# Patient Record
Sex: Female | Born: 1955 | Race: White | Hispanic: No | Marital: Single | Smoking: Former smoker
Health system: Southern US, Community
[De-identification: ages and names within clinical notes are randomized; demographics above are authoritative.]

## PROBLEM LIST (undated history)

## (undated) DIAGNOSIS — E669 Obesity, unspecified: Secondary | ICD-10-CM

## (undated) DIAGNOSIS — Z9889 Other specified postprocedural states: Secondary | ICD-10-CM

## (undated) DIAGNOSIS — R3915 Urgency of urination: Secondary | ICD-10-CM

## (undated) DIAGNOSIS — M549 Dorsalgia, unspecified: Secondary | ICD-10-CM

## (undated) DIAGNOSIS — J189 Pneumonia, unspecified organism: Secondary | ICD-10-CM

## (undated) DIAGNOSIS — R112 Nausea with vomiting, unspecified: Secondary | ICD-10-CM

## (undated) DIAGNOSIS — T7840XA Allergy, unspecified, initial encounter: Secondary | ICD-10-CM

## (undated) DIAGNOSIS — J302 Other seasonal allergic rhinitis: Secondary | ICD-10-CM

## (undated) DIAGNOSIS — G4700 Insomnia, unspecified: Secondary | ICD-10-CM

## (undated) DIAGNOSIS — Z8619 Personal history of other infectious and parasitic diseases: Secondary | ICD-10-CM

## (undated) DIAGNOSIS — K219 Gastro-esophageal reflux disease without esophagitis: Secondary | ICD-10-CM

## (undated) DIAGNOSIS — M797 Fibromyalgia: Secondary | ICD-10-CM

## (undated) DIAGNOSIS — F419 Anxiety disorder, unspecified: Secondary | ICD-10-CM

## (undated) DIAGNOSIS — R945 Abnormal results of liver function studies: Secondary | ICD-10-CM

## (undated) DIAGNOSIS — Z8709 Personal history of other diseases of the respiratory system: Secondary | ICD-10-CM

## (undated) DIAGNOSIS — G43909 Migraine, unspecified, not intractable, without status migrainosus: Secondary | ICD-10-CM

## (undated) DIAGNOSIS — E785 Hyperlipidemia, unspecified: Secondary | ICD-10-CM

## (undated) DIAGNOSIS — M2550 Pain in unspecified joint: Secondary | ICD-10-CM

## (undated) DIAGNOSIS — M62830 Muscle spasm of back: Secondary | ICD-10-CM

## (undated) DIAGNOSIS — M069 Rheumatoid arthritis, unspecified: Secondary | ICD-10-CM

## (undated) DIAGNOSIS — R7989 Other specified abnormal findings of blood chemistry: Secondary | ICD-10-CM

## (undated) DIAGNOSIS — I10 Essential (primary) hypertension: Secondary | ICD-10-CM

## (undated) DIAGNOSIS — G8929 Other chronic pain: Secondary | ICD-10-CM

## (undated) DIAGNOSIS — M2540 Effusion, unspecified joint: Secondary | ICD-10-CM

## (undated) DIAGNOSIS — M1990 Unspecified osteoarthritis, unspecified site: Secondary | ICD-10-CM

## (undated) HISTORY — DX: Fibromyalgia: M79.7

## (undated) HISTORY — DX: Allergy, unspecified, initial encounter: T78.40XA

## (undated) HISTORY — DX: Other specified abnormal findings of blood chemistry: R79.89

## (undated) HISTORY — DX: Obesity, unspecified: E66.9

## (undated) HISTORY — PX: COLONOSCOPY: SHX174

## (undated) HISTORY — DX: Abnormal results of liver function studies: R94.5

## (undated) HISTORY — PX: BACK SURGERY: SHX140

## (undated) HISTORY — PX: CARPAL TUNNEL RELEASE: SHX101

## (undated) HISTORY — PX: NECK SURGERY: SHX720

## (undated) HISTORY — DX: Hyperlipidemia, unspecified: E78.5

---

## 1997-10-29 ENCOUNTER — Ambulatory Visit (HOSPITAL_BASED_OUTPATIENT_CLINIC_OR_DEPARTMENT_OTHER): Admission: RE | Admit: 1997-10-29 | Discharge: 1997-10-29 | Payer: Self-pay

## 2001-05-03 HISTORY — PX: ABDOMINAL HYSTERECTOMY: SHX81

## 2003-02-11 ENCOUNTER — Inpatient Hospital Stay (HOSPITAL_COMMUNITY): Admission: RE | Admit: 2003-02-11 | Discharge: 2003-02-13 | Payer: Self-pay | Admitting: Obstetrics and Gynecology

## 2003-02-11 ENCOUNTER — Encounter (INDEPENDENT_AMBULATORY_CARE_PROVIDER_SITE_OTHER): Payer: Self-pay | Admitting: Specialist

## 2004-08-08 ENCOUNTER — Emergency Department (HOSPITAL_COMMUNITY): Admission: EM | Admit: 2004-08-08 | Discharge: 2004-08-08 | Payer: Self-pay | Admitting: Emergency Medicine

## 2007-04-05 ENCOUNTER — Ambulatory Visit: Payer: Self-pay | Admitting: Obstetrics & Gynecology

## 2007-04-05 ENCOUNTER — Ambulatory Visit (HOSPITAL_COMMUNITY): Admission: RE | Admit: 2007-04-05 | Discharge: 2007-04-05 | Payer: Self-pay | Admitting: Family Medicine

## 2007-04-05 ENCOUNTER — Encounter (INDEPENDENT_AMBULATORY_CARE_PROVIDER_SITE_OTHER): Payer: Self-pay | Admitting: Obstetrics & Gynecology

## 2010-09-15 NOTE — Group Therapy Note (Signed)
[name], [name]                  ACCOUNT NO.:  [id number]   MEDICAL RECORD NO.:  [id number]          PATIENT TYPE:  WOC   LOCATION:  [facility]                   FACILITY:  [facility]   PHYSICIAN:  Dorthula Perfect, MD     DATE OF BIRTH:  04-12-1956   DATE OF SERVICE:                                  CLINIC NOTE   This is a 55 year old white single female, multigravida, and is here for  evaluation and pap smear. She had an abdominal hysterectomy with  bilateral salpingo-oophorectomy in October 2004 for fibroids. She has  been on hormone replacement therapy since that time. She believes that  she is taking Estradiol, but is not positive, and does not know the  dose. For the past couple of years, she has been having migraine  headaches that originate in the left posterior neck area and come up the  neck into the left temporal-parietal area. Since she has been taking 1  Aleve a day, she has not had the headaches. That has been since October.  She has also gained 40 pounds in the past 6 months.   PAST SURGICAL HISTORY:  Hysterectomy, two left breast cysts years ago.   MEDICATIONS:  1. Blood pressure medication. The name of which she does not remember      at this time.  2. Anti-anxiety pill.  3. Estrogen.   FAMILY HISTORY:  No history of breast, colon, or ovarian cancer. The  patient had a mammogram earlier this afternoon.   PHYSICAL EXAMINATION:  VITAL SIGNS:  Height 5 foot 4 inches, weight 186  pounds, blood pressure 146/85.  THYROID:  Normal.  BREASTS:  The right breast is completely normal. Examination of the left  breast reveals an oblong, soft, mobile nontender mass at about the 2  o'clock position, which is beneath her old breast biopsy scar. It  measures about 2.5 sonometers by 1.5 sonometers. Her mammogram report is  not available right now.  ABDOMEN:  Soft and nontender. No masses.  PELVIC:  External genitalia, BUSnormal, vaginal vault epithelialized.  The cuff is normal.  There are no pelvic masses.   IMPRESSION:  1. Normal GYN exam.  2. Breast mass.  3. Migraine headaches by history.   DISPOSITION:  1. Pap smear.  2. If this pap smear is normal, she would not need to have pap smears      again in the future.  3. She will continue to take the Aleve and if her headaches get to be      a problem, I have recommended that she consider [facility].  4. She will start watching the carbs and start a walking and exercise      program. I believe that within a year, she will have her weight      where she wants it to be.  5. Once the mammogram report comes in, she will be contacted. If the      mammogram is normal, she will need to be referred to a general      surgeon for further evaluation and  possible excision.           ______________________________  Dorthula Perfect, MD     ER/MEDQ  D:  04/05/2007  T:  04/06/2007  Job:  [id number]

## 2010-09-18 NOTE — H&P (Signed)
NAME:  [name], [name]                            ACCOUNT NO.:  [id number]   MEDICAL RECORD NO.:  [id number]                   PATIENT TYPE:  INP   LOCATION:  NA                                   FACILITY:  [facility]   PHYSICIAN:  Zenaida Niece, M.D.             DATE OF BIRTH:  29-Sep-1955   DATE OF ADMISSION:  DATE OF DISCHARGE:                                HISTORY & PHYSICAL   CHIEF COMPLAINT:  Symptomatic leiomyomatous uterus.   HISTORY OF PRESENT ILLNESS:  This is a 55 year old white female gravida 3,  para 2-0-1-2 who I first saw in June of this year.  At that time, she was  referred to see me by Dr. Jeannetta Nap due to menopausal symptoms.  At that time  on October 31, 2002, she reported regular menses every month but at that time  had been spotting since her last period was started October 11, 2002.  She  complained of frequent headache, bad vasomotor symptoms and not sleeping  well.  Prior to that, she had no problems with her menses.  She reported  gaining weight approximately 16-17 pounds in the past years, craving sweets  and water.  She reported that Dr. Jeannetta Nap does her pelvic exams.  A physical  exam was not done at that point.  We discussed menopausal symptoms and  options and elected to try her on Mircette and to follow up in three months.  She was then seen on January 08, 2003.  At that time again she reported  seeing Dr. Jeannetta Nap in December 2003 and was told she had a normal pelvic  exam.  She had been recently on the Mircette for control of vasomotor  symptoms and over the past two months has had irregular bleeding.  A vaginal  ultrasound revealed a large 8.2 x 6.5 cm anterior myoma.  Bimanual exam  revealed a 12 week size uterus with a palpable myoma.  All options were  discussed with the patient, and she wishes to proceed with definitive  surgical therapy including bilateral salpingo-oophorectomy.   PAST OBSTETRICAL HISTORY:  Two vaginal deliveries at term without  complications and one spontaneous miscarriage.   PAST SURGICAL HISTORY:  1. Anterior cervical disk left shoulder.  2. Left breast biopsy.  3. Tubal ligation.   PAST MEDICAL HISTORY:  History of hypothyroidism.   CURRENT MEDICATIONS:  None.   ALLERGIES:  None known.   SOCIAL HISTORY:  I believe she is currently single and denies alcohol,  tobacco or drug use.   FAMILY HISTORY:  Maternal aunt with ovarian cancer.   REVIEW OF SYSTEMS:  Otherwise negative.   PHYSICAL EXAMINATION:  GENERAL:  She is a well-developed, well-nourished  white female in no acute distress.  NECK:  Supple without lymphadenopathy or thyromegaly.  LUNGS:  Clear to auscultation.  HEART:  Regular rate and rhythm without murmur.  ABDOMEN:  Soft and nontender,  nondistended without palpable masses.  The  uterine fundus is palpable just above the pubic symphysis.  PELVIC:  Exam again reveals a 12 week size uterus with palpable myoma and no  adnexal masses.  She has normal external genitalia.  EXTREMITIES:  No edema, nontender.   ASSESSMENT:  Symptomatic leiomyomatous uterus.  The patient is now having  irregular bleeding due to a large myoma.  She wishes to proceed with  definitive surgical therapy.  All treatment options were discussed with the  patient.  Risks and benefits of all surgeries were also discussed.  The  patient understands the risks of surgery including bleeding, infection and  damage to surrounding organs as well as permanent sterility and wishes to  proceed.   PLAN:  The plan is to admit the patient on the day of surgery for a total  abdominal hysterectomy with bilateral salpingo-oophorectomy.                                               Zenaida Niece, M.D.    TDM/MEDQ  D:  02/10/2003  T:  02/10/2003  Job:  [id number]

## 2010-09-18 NOTE — Discharge Summary (Signed)
   NAME:  [name], [name]                            ACCOUNT NO.:  [id number]   MEDICAL RECORD NO.:  [id number]                   PATIENT TYPE:  INP   LOCATION:  0464                                 FACILITY:  [facility]   PHYSICIAN:  Zenaida Niece, M.D.             DATE OF BIRTH:  October 08, 1955   DATE OF ADMISSION:  02/11/2003  DATE OF DISCHARGE:  02/13/2003                                 DISCHARGE SUMMARY   ADMISSION DIAGNOSIS:  Symptomatic leiomyomatous uterus.   DISCHARGE DIAGNOSIS:  Symptomatic leiomyomatous uterus.   PROCEDURES:  On October 11, she had a total abdominal hysterectomy.   HISTORY AND PHYSICAL:  Please see chart for full history and physical but,  briefly, this is a 55 year old white female, gravida 3, para 2-0-1-2, with  an enlarged 12 week size uterus with fibroids by ultrasound on exam with  irregular bleeding and anemia, who is admitted for definitive surgical  therapy.   PAST MEDICAL HISTORY:  Two vaginal deliveries.   PHYSICAL EXAMINATION:  Approximately 12 weeks size uterus without adnexal  masses.   HOSPITAL COURSE:  The patient was admitted on the day of surgery and  underwent a TAH/BSO under general anesthesia.  She had a 12 weeks size  uterus with obvious leiomyomas.  Tubes and ovaries were normal.  The  estimated blood loss was 100 mL.  Postoperatively, she had no complications.  Preoperative hemoglobin was 8.9 and postoperative was 9.8.  On the morning  of postoperative day #2, she was stable for discharge home.  At that time,  her incision was healing well, and staples were removed and Steri-Strips  applied.   DISCHARGE INSTRUCTIONS:  1. Regular diet.  2. Pelvic rest.  3. Follow up in two weeks.   ACTIVITY:  1. Pelvic rest.  2. No strenuous activity.   MEDICATIONS:  Percocet #30, 102 p.o. q.4-6h. p.r.n. pain.                                               Zenaida Niece, M.D.    TDM/MEDQ  D:  02/13/2003  T:  02/13/2003  Job:  [id number]

## 2010-09-18 NOTE — Op Note (Signed)
NAME:  [name], [name]                            ACCOUNT NO.:  [id number]   MEDICAL RECORD NO.:  [id number]                   PATIENT TYPE:  INP   LOCATION:  X009                                 [facility]ACILITY:  [facility]   PHYSICIAN:  Zenaida Niece, M.D.             DATE OF BIRTH:  05-19-1955   DATE OF PROCEDURE:  02/11/2003  DATE OF DISCHARGE:                                 OPERATIVE REPORT   PREOPERATIVE DIAGNOSIS:  Symptomatic leiomyomatous uterus with anemia.   POSTOPERATIVE DIAGNOSIS:  Symptomatic leiomyomatous uterus with anemia.   PROCEDURE:  Total abdominal hysterectomy and bilateral salpingo-  oophorectomy.   SURGEON:  Zenaida Niece, M.D.   ASSISTANT:  Huel Cote, M.D.   ANESTHESIA:  General endotracheal tube.   ESTIMATED BLOOD LOSS:  100 mL.   FINDINGS:  Twelve week size uterus with obvious fibroids.  She had normal  tubes and ovaries with evidence of a prior tubal ligation.   DESCRIPTION OF PROCEDURE:  The patient was taken to the operating room and  placed in the dorsal supine position.  General anesthesia was induced.  The  abdomen, perineum and vagina were prepped and draped in the usual sterile  fashion and a Foley catheter inserted.  The abdomen was then entered via a  standard Pfannenstiel incision along her previous scar from what I presume  is her mini lap tubal.  A self retaining retractor was placed and the bowel  was packed out of the pelvis.  Her uterus was enlarged and was able to be  delivered through the incision.  The round ligaments were divided with  electrocautery on each side. A window was made in an avascular portion of  the broad ligament and both infundibulopelvic ligaments were clamped,  transected and doubly ligated with #1 chromic.  The uterine arteries were  skeletonized and then clamped, transected and ligated with #1 chromic.  The  bladder was pushed inferiorly bluntly and sharply.  The cardinal ligaments,  uterosacral ligaments  and vaginal angles were clamped, transected and  ligated on each side with #1 chromic.  The uterosacral ligaments and vaginal  angles were tagged for later use.  The remainder of the uterus was removed  sharply from the vagina.  The remainder of the vagina was closed with  interrupted figure-of-eight sutures of #1 chromic.  Bleeding from below this  was controlled with electrocautery.  The bladder was pushed well inferior.  All pedicles were inspected and found to be hemostatic after the pelvis was  irrigated.  Both ureters were identified and found to be inferior from the  operative field.  The uterosacral ligaments were plicated in the midline  with suture of 0 silk.  The pelvis was again inspected and found to be  hemostatic.  All packs were removed and the self retaining retractor was  removed.  The subfascial space was made hemostatic with electrocautery.  The  fascia was closed in a running fashion starting at both ends and meeting in  the middle with 0 Vicryl.  The subcutaneous tissue was irrigated and made  hemostatic with electrocautery.  The  skin was then closed with staples and a sterile dressing.  The patient  tolerated the procedure well and was taken to the recovery room in stable  condition.  Counts were correct times two.  She was given Ancef 1 gram prior  to the procedure and had PAS hose on throughout the procedure.                                               Zenaida Niece, M.D.    TDM/MEDQ  D:  02/11/2003  T:  02/11/2003  Job:  [id number]

## 2011-11-12 ENCOUNTER — Encounter (HOSPITAL_BASED_OUTPATIENT_CLINIC_OR_DEPARTMENT_OTHER): Payer: Self-pay | Admitting: Emergency Medicine

## 2011-11-12 ENCOUNTER — Emergency Department (HOSPITAL_BASED_OUTPATIENT_CLINIC_OR_DEPARTMENT_OTHER)
Admission: EM | Admit: 2011-11-12 | Discharge: 2011-11-12 | Disposition: A | Discharge status: Home / Self Care | Payer: Self-pay | Attending: Emergency Medicine | Admitting: Emergency Medicine

## 2011-11-12 DIAGNOSIS — G43909 Migraine, unspecified, not intractable, without status migrainosus: Secondary | ICD-10-CM | POA: Insufficient documentation

## 2011-11-12 DIAGNOSIS — I10 Essential (primary) hypertension: Secondary | ICD-10-CM | POA: Insufficient documentation

## 2011-11-12 DIAGNOSIS — F17200 Nicotine dependence, unspecified, uncomplicated: Secondary | ICD-10-CM | POA: Insufficient documentation

## 2011-11-12 DIAGNOSIS — Z90710 Acquired absence of both cervix and uterus: Secondary | ICD-10-CM | POA: Insufficient documentation

## 2011-11-12 HISTORY — DX: Migraine, unspecified, not intractable, without status migrainosus: G43.909

## 2011-11-12 HISTORY — DX: Essential (primary) hypertension: I10

## 2011-11-12 MED ORDER — METOCLOPRAMIDE HCL 5 MG/ML IJ SOLN
10.0000 mg | Freq: Once | INTRAMUSCULAR | Status: AC
  Administered 2011-11-12: 10 mg via INTRAVENOUS
  Filled 2011-11-12: qty 2

## 2011-11-12 MED ORDER — DIPHENHYDRAMINE HCL 50 MG/ML IJ SOLN
25.0000 mg | Freq: Once | INTRAMUSCULAR | Status: AC
  Administered 2011-11-12: 25 mg via INTRAVENOUS
  Filled 2011-11-12: qty 1

## 2011-11-12 MED ORDER — DEXAMETHASONE SODIUM PHOSPHATE 10 MG/ML IJ SOLN
10.0000 mg | Freq: Once | INTRAMUSCULAR | Status: AC
  Administered 2011-11-12: 10 mg via INTRAVENOUS
  Filled 2011-11-12: qty 1

## 2011-11-12 MED ORDER — SODIUM CHLORIDE 0.9 % IV BOLUS (SEPSIS)
1000.0000 mL | Freq: Once | INTRAVENOUS | Status: AC
  Administered 2011-11-12: 1000 mL via INTRAVENOUS

## 2011-11-12 NOTE — ED Provider Notes (Addendum)
History     CSN: [id number]  Arrival date & time 11/12/11  1900   First MD Initiated Contact with Patient 11/12/11 2013      Chief Complaint  Patient presents with  . Migraine    (Consider location/radiation/quality/duration/timing/severity/associated sxs/prior treatment) HPI  H/o migraines x 10 years pw headache. Started 4-5 days ago gradually worsening. C/O frontal headache 10/10 at this time behind both eye. Photophobia no phonophobia. +Nausea and vomiting, b/l hand numbness all typical of her migraines. This is not her worse migraine. She took ibuprofen pta which did not help with the pain. Was seen by her pmd yesterday and was given phenergan and unk pain medication with min relief.Denies fever +chills. Diffuse neck stiffness. Denies sick contacts. No anticoagulants. No BHT   ED Notes, ED Provider Notes from 11/12/11 0000 to 11/12/11 19:33:26       Alleen Borne, RN 11/12/2011 19:29      Migraine with vomiting x1 week. No relief from home meds. Saw PMD yesterday and got a "shot" that did not help. She does not know what she got.     Past Medical History  Diagnosis Date  . Migraine   . Hypertension     Past Surgical History  Procedure Date  . Abdominal hysterectomy   . Back surgery     No family history on file.  History  Substance Use Topics  . Smoking status: Former Games developer  . Smokeless tobacco: Never Used  . Alcohol Use: Yes     seldom    OB History    Grav Para Term Preterm Abortions TAB SAB Ect Mult Living                  Review of Systems  All other systems reviewed and are negative.  except as noted HPI   Allergies  Review of patient's allergies indicates no known allergies.  Home Medications   Current Outpatient Rx  Name Route Sig Dispense Refill  . CITALOPRAM HYDROBROMIDE 40 MG PO TABS Oral Take 40 mg by mouth daily.    Marland Kitchen HYDROCHLOROTHIAZIDE 25 MG PO TABS Oral Take 25 mg by mouth daily.    Marland Kitchen LORATADINE 10 MG PO TABS Oral Take 10 mg by  mouth daily.    Marland Kitchen PROPRANOLOL HCL 80 MG PO TABS Oral Take 80 mg by mouth 3 (three) times daily.      BP 139/83  Pulse 61  Temp 97.8 F (36.6 C) (Oral)  Resp 16  Ht 5\' 3"  (1.6 m)  Wt 189 lb (85.73 kg)  BMI 33.48 kg/m2  SpO2 100%  Physical Exam  Nursing note and vitals reviewed. Constitutional: She is oriented to person, place, and time. She appears well-developed.  HENT:  Head: Atraumatic.  Mouth/Throat: Oropharynx is clear and moist.  Eyes: Conjunctivae and EOM are normal. Pupils are equal, round, and reactive to light.       photophobia  Neck: Normal range of motion. Neck supple.  Cardiovascular: Normal rate, regular rhythm, normal heart sounds and intact distal pulses.   Pulmonary/Chest: Effort normal and breath sounds normal. No respiratory distress. She has no wheezes. She has no rales.  Abdominal: Soft. She exhibits no distension. There is no tenderness. There is no rebound and no guarding.  Musculoskeletal: Normal range of motion.  Neurological: She is alert and oriented to person, place, and time. No cranial nerve deficit. She exhibits normal muscle tone. Coordination normal.       Strength 5/5 all extremities No  pronator drift No facial droop   Skin: Skin is warm and dry. No rash noted.  Psychiatric: She has a normal mood and affect.    ED Course  Procedures (including critical care time)  Labs Reviewed - No data to display No results found.   1. Migraine     MDM  Migraine, similar to typical. I do not suspect SAH, ICH. Resolved with decadron, benadryl, NS, reglan, benadryl to 1/10. Neck stiffness resolved. No EMC precluding discharge at this time. Given Precautions for return. PMD f/u.         Forbes Cellar, MD 11/12/11 1610  Forbes Cellar, MD 11/12/11 2124

## 2011-11-12 NOTE — ED Notes (Signed)
Migraine with vomiting x1 week.  No relief from home meds.  Saw PMD yesterday and got a "shot" that did not help.  She does not know what she got.

## 2012-06-16 ENCOUNTER — Ambulatory Visit (INDEPENDENT_AMBULATORY_CARE_PROVIDER_SITE_OTHER): Payer: BC Managed Care – PPO | Admitting: Internal Medicine

## 2012-06-16 ENCOUNTER — Ambulatory Visit (HOSPITAL_COMMUNITY)
Admission: RE | Admit: 2012-06-16 | Discharge: 2012-06-16 | Disposition: A | Discharge status: Home / Self Care | Payer: BC Managed Care – PPO | Source: Ambulatory Visit | Attending: Internal Medicine | Admitting: Internal Medicine

## 2012-06-16 ENCOUNTER — Ambulatory Visit: Payer: BC Managed Care – PPO

## 2012-06-16 VITALS — BP 134/82 | HR 66 | Temp 98.4°F | Resp 16 | Ht 64.0 in | Wt 189.0 lb

## 2012-06-16 DIAGNOSIS — I10 Essential (primary) hypertension: Secondary | ICD-10-CM

## 2012-06-16 DIAGNOSIS — K8020 Calculus of gallbladder without cholecystitis without obstruction: Secondary | ICD-10-CM | POA: Insufficient documentation

## 2012-06-16 DIAGNOSIS — G43909 Migraine, unspecified, not intractable, without status migrainosus: Secondary | ICD-10-CM | POA: Insufficient documentation

## 2012-06-16 DIAGNOSIS — R112 Nausea with vomiting, unspecified: Secondary | ICD-10-CM

## 2012-06-16 DIAGNOSIS — R1011 Right upper quadrant pain: Secondary | ICD-10-CM

## 2012-06-16 DIAGNOSIS — R109 Unspecified abdominal pain: Secondary | ICD-10-CM | POA: Insufficient documentation

## 2012-06-16 DIAGNOSIS — Z79899 Other long term (current) drug therapy: Secondary | ICD-10-CM

## 2012-06-16 LAB — POCT UA - MICROSCOPIC ONLY
Casts, Ur, LPF, POC: NEGATIVE
Mucus, UA: NEGATIVE
Yeast, UA: NEGATIVE

## 2012-06-16 LAB — COMPREHENSIVE METABOLIC PANEL
ALT: 29 U/L (ref 0–35)
Alkaline Phosphatase: 77 U/L (ref 39–117)
BUN: 15 mg/dL (ref 6–23)
Calcium: 8.7 mg/dL (ref 8.4–10.5)
Creat: 0.72 mg/dL (ref 0.50–1.10)
Glucose, Bld: 129 mg/dL — ABNORMAL HIGH (ref 70–99)
Sodium: 141 mEq/L (ref 135–145)
Total Protein: 6.6 g/dL (ref 6.0–8.3)

## 2012-06-16 LAB — POCT CBC
HCT, POC: 43.1 % (ref 37.7–47.9)
Hemoglobin: 13.6 g/dL (ref 12.2–16.2)
Lymph, poc: 1.3 (ref 0.6–3.4)
MCHC: 31.6 g/dL — AB (ref 31.8–35.4)
MPV: 9.5 fL (ref 0–99.8)
POC Granulocyte: 4.6 (ref 2–6.9)
POC LYMPH PERCENT: 21.3 %L (ref 10–50)
POC MID %: 5.8 %M (ref 0–12)

## 2012-06-16 LAB — POCT URINALYSIS DIPSTICK
Blood, UA: NEGATIVE
Ketones, UA: 40
Leukocytes, UA: NEGATIVE
Nitrite, UA: NEGATIVE
Protein, UA: 100
Spec Grav, UA: 1.025
Urobilinogen, UA: 1
pH, UA: 6

## 2012-06-16 MED ORDER — ONDANSETRON HCL 8 MG PO TABS: 8.0000 mg | ORAL_TABLET | Freq: Three times a day (TID) | ORAL | Status: DC | PRN

## 2012-06-16 MED ORDER — HYDROCODONE-ACETAMINOPHEN 5-325 MG PO TABS: 1.0000 | ORAL_TABLET | Freq: Four times a day (QID) | ORAL | Status: DC | PRN

## 2012-06-16 NOTE — Progress Notes (Signed)
Subjective:    Patient ID: [name], female    DOB: 10/15/1955, 56 y.o.   MRN: [id number]  HPI Sick since 5 days ago. Started with uri, then sudden mid to right abdominal pain with vomiting. Has felt hot, chills. Pain worst at RUQ . Only abdominal surgery TAH. No hx of dxed GB problems, but has a long hx of RUQ ache after eating. Much worse now. Also has HA , has hx of migrains and htn.   Review of Systems  Constitutional: Positive for fever and fatigue.  HENT: Positive for congestion. Negative for neck pain.   Eyes: Negative.   Respiratory: Negative.   Cardiovascular: Negative.   Genitourinary: Negative.   Neurological: Positive for headaches. Negative for dizziness, weakness and numbness.  Psychiatric/Behavioral: Negative.        Objective:   Physical Exam  Vitals reviewed. Constitutional: She is oriented to person, place, and time. She appears well-developed and well-nourished. She appears distressed.  HENT:  Right Ear: External ear normal.  Left Ear: External ear normal.  Mouth/Throat: Oropharynx is clear and moist.  Eyes: EOM are normal. No scleral icterus.  Neck: Normal range of motion. Neck supple.  Cardiovascular: Normal rate, regular rhythm and normal heart sounds.   Pulmonary/Chest: Effort normal.  Abdominal: There is tenderness. There is guarding.  Musculoskeletal: Normal range of motion.  Neurological: She is alert and oriented to person, place, and time. No cranial nerve deficit. She exhibits normal muscle tone. Coordination normal.  Skin: Skin is warm and dry. No rash noted.  Psychiatric: She has a normal mood and affect.  Mid guarding  [facility] reading (PRIMARY) by  Dr.Guest. NAD,no free air, no obstruction, mild ileus Results for orders placed in visit on 06/16/12  POCT CBC      Result Value Range   WBC 6.3  4.6 - 10.2 K/uL   Lymph, poc 1.3  0.6 - 3.4   POC LYMPH PERCENT 21.3  10 - 50 %L   MID (cbc) 0.4  0 - 0.9   POC MID % 5.8  0 - 12 %M   POC  Granulocyte 4.6  2 - 6.9   Granulocyte percent 72.9  37 - 80 %G   RBC 4.58  4.04 - 5.48 M/uL   Hemoglobin 13.6  12.2 - 16.2 g/dL   HCT, POC 43.1  37.7 - 47.9 %   MCV 94.1  80 - 97 fL   MCH, POC 29.7  27 - 31.2 pg   MCHC 31.6 (*) 31.8 - 35.4 g/dL   RDW, POC 14.2     Platelet Count, POC 358  142 - 424 K/uL   MPV 9.5  0 - 99.8 fL  POCT UA - MICROSCOPIC ONLY      Result Value Range   WBC, Ur, HPF, POC 2-3     RBC, urine, microscopic 0-1     Bacteria, U Microscopic 1+     Mucus, UA neg     Epithelial cells, urine per micros 8-10     Crystals, Ur, HPF, POC neg     Casts, Ur, LPF, POC neg     Yeast, UA neg    POCT URINALYSIS DIPSTICK      Result Value Range   Color, UA amber     Clarity, UA sl cloudy     Glucose, UA neg     Bilirubin, UA moderate     Ketones, UA 40     Spec Grav, UA 1.025       Blood, UA neg     pH, UA 6.0     Protein, UA 100     Urobilinogen, UA 1.0     Nitrite, UA neg     Leukocytes, UA Negative            Assessment & Plan:  Schedule US abd for today Clear liquid diet/Zofran/HC RTC Saturday or Sunday f/up  Ultrasound revealed cholelithiasis, probable cause of her sxs. Will refer to surgery to consult/called and gave US report to [name] 

## 2012-06-16 NOTE — Patient Instructions (Addendum)
Cholecyst Gallbladder Disease You have gallbladder disease. This means that there are stones and/or inflammation in your gallbladder and bile duct system. The symptoms of this disease are: heartburn, nausea, belching, vomiting, and intolerance to certain foods (fatty foods mainly). Exact diagnosis of this condition requires ultrasound or special x-ray examination. Gallbladder symptoms may improve with a proper low-fat diet and weight loss (if you are overweight). Medicines to relieve pain and reduce spasms in the bile duct may be quite helpful. Usually the diseased gallbladder needs to be removed by surgery.  SEEK IMMEDIATE MEDICAL CARE IF:  You have more severe or persistent pain that lasts for more than one day. The pain would most likely be in the upper right part of the abdomen.   You develop a fever, repeated vomiting, or jaundice (yellow skin and eyes).  Document Released: 05/27/2004 Document Revised: 12/30/2010 Document Reviewed: 03/07/2009 [facility] Patient Information 2012 [place], [place].Cholecystitis Cholecystitis is an inflammation of your gallbladder. It is usually caused by a buildup of gallstones or sludge (cholelithiasis) in your gallbladder. The gallbladder stores a fluid that helps digest fats (bile). Cholecystitis is serious and needs treatment right away.  CAUSES   Gallstones. Gallstones can block the tube that leads to your gallbladder, causing bile to build up. As bile builds up, the gallbladder becomes inflamed.  Bile duct problems, such as blockage from scarring or kinking.  Tumors. Tumors can stop bile from leaving your gallbladder correctly, causing bile to build up. As bile builds up, the gallbladder becomes inflamed. SYMPTOMS   Nausea.  Vomiting.  Abdominal pain, especially in the upper right area of your abdomen.  Abdominal tenderness or bloating.  Sweating.  Chills.  Fever.  Yellowing of the skin and the whites of the eyes (jaundice). DIAGNOSIS  Your  caregiver may order blood tests to look for infection or gallbladder problems. Your caregiver may also order imaging tests, such as an ultrasound or computed tomography (CT) scan. Further tests may include a hepatobiliary iminodiacetic acid (HIDA) scan. This scan allows your caregiver to see your bile move from the liver to the gallbladder and to the small intestine. TREATMENT  A hospital stay is usually necessary to lessen the inflammation of your gallbladder. You may be required to not eat or drink (fast) for a certain amount of time. You may be given medicine to treat pain or an antibiotic medicine to treat an infection. Surgery may be needed to remove your gallbladder (cholecystectomy) once the inflammation has gone down. Surgery may be needed right away if you develop complications such as death of gallbladder tissue (gangrene) or a tear (perforation) of the gallbladder.  HOME CARE INSTRUCTIONS  Home care will depend on your treatment. In general:  If you were given antibiotics, take them as directed. Finish them even if you start to feel better.  Only take over-the-counter or prescription medicines for pain, discomfort, or fever as directed by your caregiver.  Follow a low-fat diet until you see your caregiver again.  Keep all follow-up visits as directed by your caregiver. SEEK IMMEDIATE MEDICAL CARE IF:   Your pain is increasing and not controlled by medicines.  Your pain moves to another part of your abdomen or to your back.  You have a fever.  You have nausea and vomiting. MAKE SURE YOU:  Understand these instructions.  Will watch your condition.  Will get help right away if you are not doing well or get worse. Document Released: 04/19/2005 Document Revised: 07/12/2011 Document Reviewed: 03/05/2011 [employer] Patient  Information 2013 [place], [place]. Clear Liquid Diet The clear liquid dietconsists of foods that are liquid or will become liquid at room temperature.You should  be able to see through the liquid and beverages. Examples of foods allowed on a clear liquid diet include fruit juice, broth or bouillon, gelatin, or frozen ice pops. The purpose of this diet is to provide necessary fluid, electrolytes such as sodium and potassium, and energy to keep the body functioning during times when you are not able to consume a regular diet.A clear liquid diet should not be continued for long periods of time as it is not nutritionally adequate.  REASONS FOR USING A CLEAR LIQUID DIET  In sudden onset (acute) conditions for a patient before or after surgery.  As the first step in oral feeding.  For fluid and electrolyte replacement in diarrheal diseases.  As a diet before certain medical tests are performed. ADEQUACY The clear liquid diet is adequate only in ascorbic acid, according to the Recommended Dietary Allowances of the [employer]. CHOOSING FOODS Breads and Starches  Allowed:  None are allowed.  Avoid: All are avoided. Vegetables  Allowed:  Strained tomato or vegetable juice.  Avoid: Any others. Fruit  Allowed:  Strained fruit juices and fruit drinks. Include 1 serving of citrus or vitamin C-enriched fruit juice daily.  Avoid: Any others. Meat and Meat Substitutes  Allowed:  None are allowed.  Avoid: All are avoided. Milk  Allowed:  None are allowed.  Avoid: All are avoided. Soups and Combination Foods  Allowed:  Clear bouillon, broth, or strained broth-based soups.  Avoid: Any others. Desserts and Sweets  Allowed:  Sugar, honey. High protein gelatin. Flavored gelatin, ices, or frozen ice pops that do not contain milk.  Avoid: Any others. Fats and Oils  Allowed:  None are allowed.  Avoid: All are avoided. Beverages  Allowed: Cereal beverages, coffee (regular or decaffeinated), tea, or soda at the discretion of your caregiver.  Avoid: Any others. Condiments  Allowed:  Iodized salt.  Avoid: Any others, including  pepper. Supplements  Allowed:  Liquid nutrition beverages.  Avoid: Any others that contain lactose or fiber. SAMPLE MEAL PLAN Breakfast  4 oz (120 mL) strained orange juice.   to 1 cup (125 to 250 mL) gelatin (plain or fortified).  1 cup (250 mL) beverage (coffee or tea).  Sugar, if desired. Midmorning Snack   cup (125 mL) gelatin (plain or fortified). Lunch  1 cup (250 mL) broth or consomm.  4 oz (120 mL) strained grapefruit juice.   cup (125 mL) gelatin (plain or fortified).  1 cup (250 mL) beverage (coffee or tea).  Sugar, if desired. Midafternoon Snack   cup (125 mL) fruit ice.   cup (125 mL) strained fruit juice. Dinner  1 cup (250 mL) broth or consomm.   cup (125 mL) cranberry juice.   cup (125 mL) flavored gelatin (plain or fortified).  1 cup (250 mL) beverage (coffee or tea).  Sugar, if desired. Evening Snack  4 oz (120 mL) strained apple juice (vitamin C-fortified).   cup (125 mL) flavored gelatin (plain or fortified). Document Released: 04/19/2005 Document Revised: 07/12/2011 Document Reviewed: 07/17/2010 [facility] Patient Information 2013 [place], [place].

## 2012-06-17 ENCOUNTER — Telehealth: Payer: Self-pay

## 2012-06-17 ENCOUNTER — Encounter: Payer: Self-pay | Admitting: Radiology

## 2012-06-17 NOTE — Telephone Encounter (Signed)
PT STATES HER AND DR GUEST ONLY DISCUSSED HER GALL BLADDER, BUT FORGOT TO ADDRESS HER SINUS AND COLD ISSUES WHICH SHE DOES HAVE NEED TO HAVE SOMETHING CALLED IN FOR HER. PLEASE CALL [phone]    [facility] ON [address]

## 2012-06-18 ENCOUNTER — Telehealth: Payer: Self-pay

## 2012-06-18 NOTE — Telephone Encounter (Signed)
Called patient and she said that the concerns with the gallbladder took over her visit on the 14th and her sinus sxs weren't addressed. She is having runny nose, nasal congestion, mucous mostly clear but has been a little yellowish. She said if there is something she can take OTC that's fine but with the gallbladder issues not sure what she can take.

## 2012-06-18 NOTE — Telephone Encounter (Signed)
See previous message

## 2012-06-18 NOTE — Telephone Encounter (Signed)
Was seen last wk with a cold and gall bladder issue. She has been to the referral for gall bladder, but still has a cold. She wonders if she needs some meds for her cold  [phone]

## 2012-06-19 ENCOUNTER — Ambulatory Visit (HOSPITAL_COMMUNITY): Payer: Self-pay

## 2012-06-19 MED ORDER — IPRATROPIUM BROMIDE 0.03 % NA SOLN: 2.0000 | Freq: Two times a day (BID) | NASAL | Status: DC

## 2012-06-19 NOTE — Telephone Encounter (Signed)
Called patient left message for her to call me back so I can advise.  

## 2012-06-19 NOTE — Telephone Encounter (Signed)
I have spoken to patient to advise

## 2012-06-19 NOTE — Telephone Encounter (Signed)
I have sent a nasal spray to the pharmacy, use that twice daily for relief of congestion.  Also start otc mucinex and zyrtec to help with symptoms

## 2012-06-26 ENCOUNTER — Observation Stay (HOSPITAL_COMMUNITY)
Admission: AD | Admit: 2012-06-26 | Discharge: 2012-06-28 | Disposition: A | Discharge status: Home / Self Care | Payer: BC Managed Care – PPO | Source: Ambulatory Visit | Attending: General Surgery | Admitting: General Surgery

## 2012-06-26 ENCOUNTER — Other Ambulatory Visit (INDEPENDENT_AMBULATORY_CARE_PROVIDER_SITE_OTHER): Payer: Self-pay | Admitting: Student

## 2012-06-26 ENCOUNTER — Encounter (HOSPITAL_COMMUNITY): Payer: Self-pay

## 2012-06-26 ENCOUNTER — Ambulatory Visit (INDEPENDENT_AMBULATORY_CARE_PROVIDER_SITE_OTHER): Payer: BC Managed Care – PPO | Admitting: Surgery

## 2012-06-26 ENCOUNTER — Encounter (INDEPENDENT_AMBULATORY_CARE_PROVIDER_SITE_OTHER): Payer: Self-pay | Admitting: Surgery

## 2012-06-26 VITALS — BP 150/88 | HR 57 | Temp 98.2°F | Resp 18 | Ht 64.0 in | Wt 190.6 lb

## 2012-06-26 DIAGNOSIS — I10 Essential (primary) hypertension: Secondary | ICD-10-CM | POA: Insufficient documentation

## 2012-06-26 DIAGNOSIS — G43909 Migraine, unspecified, not intractable, without status migrainosus: Secondary | ICD-10-CM | POA: Insufficient documentation

## 2012-06-26 DIAGNOSIS — K8010 Calculus of gallbladder with chronic cholecystitis without obstruction: Principal | ICD-10-CM | POA: Insufficient documentation

## 2012-06-26 DIAGNOSIS — K8000 Calculus of gallbladder with acute cholecystitis without obstruction: Secondary | ICD-10-CM | POA: Insufficient documentation

## 2012-06-26 HISTORY — DX: Other specified postprocedural states: R11.2

## 2012-06-26 HISTORY — DX: Other specified postprocedural states: Z98.890

## 2012-06-26 LAB — CBC
HCT: 31.1 % — ABNORMAL LOW (ref 36.0–46.0)
Hemoglobin: 10.4 g/dL — ABNORMAL LOW (ref 12.0–15.0)
MCH: 30.5 pg (ref 26.0–34.0)
MCHC: 33.4 g/dL (ref 30.0–36.0)

## 2012-06-26 LAB — COMPREHENSIVE METABOLIC PANEL
ALT: 18 U/L (ref 0–35)
AST: 20 U/L (ref 0–37)
CO2: 26 mEq/L (ref 19–32)
Calcium: 8.7 mg/dL (ref 8.4–10.5)
GFR calc non Af Amer: >90 mL/min (ref >90)
Sodium: 137 mEq/L (ref 135–145)

## 2012-06-26 MED ORDER — DIPHENHYDRAMINE HCL 25 MG PO CAPS
25.0000 mg | ORAL_CAPSULE | Freq: Four times a day (QID) | ORAL | Status: DC | PRN
  Administered 2012-06-26 (×2): 25 mg via ORAL
  Filled 2012-06-26 (×2): qty 1

## 2012-06-26 MED ORDER — CIPROFLOXACIN IN D5W 400 MG/200ML IV SOLN
400.0000 mg | Freq: Two times a day (BID) | INTRAVENOUS | Status: DC
  Administered 2012-06-26 – 2012-06-27 (×2): 400 mg via INTRAVENOUS
  Filled 2012-06-26 (×4): qty 200

## 2012-06-26 MED ORDER — CHLORHEXIDINE GLUCONATE 4 % EX LIQD
1.0000 "application " | Freq: Once | CUTANEOUS | Status: AC
  Administered 2012-06-27: 1 via TOPICAL
  Filled 2012-06-26 (×2): qty 15

## 2012-06-26 MED ORDER — HYDROMORPHONE HCL PF 1 MG/ML IJ SOLN
1.0000 mg | INTRAMUSCULAR | Status: DC | PRN
  Administered 2012-06-26 (×2): 1 mg via INTRAVENOUS
  Filled 2012-06-26 (×2): qty 1

## 2012-06-26 MED ORDER — OXYCODONE-ACETAMINOPHEN 5-325 MG PO TABS
1.0000 | ORAL_TABLET | ORAL | Status: DC | PRN
  Administered 2012-06-26 – 2012-06-27 (×2): 2 via ORAL
  Filled 2012-06-26 (×2): qty 2

## 2012-06-26 MED ORDER — KCL IN DEXTROSE-NACL 20-5-0.9 MEQ/L-%-% IV SOLN
INTRAVENOUS | Status: DC
  Administered 2012-06-26 – 2012-06-27 (×2): via INTRAVENOUS
  Filled 2012-06-26 (×3): qty 1000

## 2012-06-26 MED ORDER — ACETAMINOPHEN 325 MG PO TABS
650.0000 mg | ORAL_TABLET | ORAL | Status: DC | PRN
  Administered 2012-06-26: 650 mg via ORAL
  Administered 2012-06-27: 1000 mg via ORAL
  Filled 2012-06-26: qty 2

## 2012-06-26 MED ORDER — ENOXAPARIN SODIUM 40 MG/0.4ML ~~LOC~~ SOLN
40.0000 mg | SUBCUTANEOUS | Status: DC
  Administered 2012-06-26 – 2012-06-27 (×2): 40 mg via SUBCUTANEOUS
  Filled 2012-06-26 (×3): qty 0.4

## 2012-06-26 MED ORDER — CHLORHEXIDINE GLUCONATE 4 % EX LIQD
1.0000 "application " | Freq: Once | CUTANEOUS | Status: DC
  Filled 2012-06-26: qty 15

## 2012-06-26 MED ORDER — ONDANSETRON HCL 4 MG/2ML IJ SOLN
4.0000 mg | Freq: Four times a day (QID) | INTRAMUSCULAR | Status: DC | PRN
  Administered 2012-06-26 – 2012-06-27 (×2): 4 mg via INTRAVENOUS
  Filled 2012-06-26 (×2): qty 2

## 2012-06-26 NOTE — Progress Notes (Signed)
Patient ID: [name], female   DOB: [birth year], 58 y.o.   MRN: [id number]  Chief Complaint  Patient presents with  . New Evaluation    eval GB stones    HPI [name] is a 57 y.o. female.  Patient sent at the request of Dr. Perrin Maltese with history of epigastric abdominal pain and nausea intermittent for 2 months. She was seen last week for ultrasound showed gallstones without cholecystitis. Over last 3 days she has had constant epigastric and right upper quadrant pain getting more severe with more nausea today and has not gotten better with medications. She has radiation of pain to her back. HPI  Past Medical History  Diagnosis Date  . Migraine   . Hypertension     Past Surgical History  Procedure Laterality Date  . Back surgery    . Abdominal hysterectomy  2003    Family History  Problem Relation Age of Onset  . Leukemia Father     Social History History  Substance Use Topics  . Smoking status: Former Smoker    Types: Cigarettes    Quit date: 05/03/1998  . Smokeless tobacco: Never Used  . Alcohol Use: No    No Known Allergies  Current Outpatient Prescriptions  Medication Sig Dispense Refill  . aspirin 81 MG tablet Take 81 mg by mouth daily.      . citalopram (CELEXA) 40 MG tablet Take 20 mg by mouth daily.       . hydrochlorothiazide (HYDRODIURIL) 25 MG tablet Take 25 mg by mouth daily.      Marland Kitchen HYDROcodone-acetaminophen (NORCO/VICODIN) 5-325 MG per tablet Take 1 tablet by mouth every 6 (six) hours as needed for pain.  30 tablet  0  . loratadine (CLARITIN) 10 MG tablet Take 10 mg by mouth daily.      . naproxen sodium (ANAPROX) 220 MG tablet Take 220 mg by mouth daily.      . propranolol (INDERAL) 80 MG tablet Take 80 mg by mouth 3 (three) times daily.      Marland Kitchen ipratropium (ATROVENT) 0.03 % nasal spray Place 2 sprays into the nose 2 (two) times daily.  30 mL  1  . ondansetron (ZOFRAN) 8 MG tablet Take 1 tablet (8 mg total) by mouth every 8 (eight) hours as needed for  nausea.  20 tablet  0   No current facility-administered medications for this visit.    Review of Systems Review of Systems  Constitutional: Negative.   HENT: Negative.   Eyes: Negative.   Respiratory: Negative.   Cardiovascular: Negative.   Gastrointestinal: Positive for nausea, vomiting and abdominal pain.  Endocrine: Negative.   Musculoskeletal: Negative.   Skin: Negative.   Allergic/Immunologic: Negative.   Hematological: Negative.   Psychiatric/Behavioral: Negative.     Blood pressure 150/88, pulse 57, temperature 98.2 F (36.8 C), temperature source Temporal, resp. rate 18, height 5\' 4"  (1.626 m), weight 190 lb 9.6 oz (86.456 kg).  Physical Exam Physical Exam  Constitutional: She is oriented to person, place, and time.  Abdominal: There is tenderness in the right upper quadrant and epigastric area. There is positive Murphy's sign. No hernia.  Musculoskeletal: Normal range of motion.  Neurological: She is alert and oriented to person, place, and time.  Skin: Skin is warm and dry.  Psychiatric: She has a normal mood and affect. Her behavior is normal. Thought content normal.    Data Reviewed RADIOLOGY REPORT*  Clinical Data: History of abdominal pain, nausea, and vomiting.  ABDOMINAL ULTRASOUND  COMPLETE  Comparison: None  Findings:  Gallbladder: There is cholelithiasis. Numerous small calculi are  seen within the gallbladder. The largest calculus has a greatest  diameter of 4 mm. No gallbladder wall thickening or  pericholecystic fluid. The gallbladder wall thickness measured 2.3  mm. No sonographic Murphy's sign according to the ultrasound  technologist.  CBD: Normal in caliber measuring 4.2 mm. No choledocholithiasis is  evident.  Liver: Normal size and echotexture without focal parenchymal  abnormality. Portal vein is patent with hepatopetal flow.  IVC: Patent throughout its visualized course in the abdomen.  Pancreas: Although the pancreas is difficult to  visualize in its  entirety, no focal pancreatic abnormality is identified.  Spleen: Normal size and echotexture without focal abnormality.  Length is 7 cm.  Right kidney: No hydronephrosis. Well-preserved cortex. Normal  parenchymal echotexture without focal abnormalities. Right renal  length is 12.1 cm.  Left kidney: No hydronephrosis. Well-preserved cortex. Normal  parenchymal echotexture without focal abnormalities. Left renal  length is 11.6 cm.  Aorta: Maximum diameter is 2.3 cm. No aneurysm is evident.  Ascites: None.  IMPRESSION: There is cholelithiasis.No sonographic evidence of  cholecystitis is seen. Bile ducts appeared normal.  Original Report Authenticated By: Onalee Hua Call   Assessment    Acute cholecystitis    Plan    Discussed with Dr. Jamey Ripa who is the physician on call at [facility]. She will be sent to [facility] for admission and probable cholecystectomy in the next 24-40 hours. Discussed with the patient. She agrees to proceed.The procedure has been discussed with the patient. Operative and non operative treatments have been discussed. Risks of surgery include bleeding, infection,  Common bile duct injury,  Injury to the stomach,liver, colon,small intestine, abdominal wall,  Diaphragm,  Major blood vessels,  And the need for an open procedure.  Other risks include worsening of medical problems, death,  DVT and pulmonary embolism, and cardiovascular events.   Medical options have also been discussed. The patient has been informed of long term expectations of surgery and non surgical options,  The patient agrees to proceed.         [name] A. 06/26/2012, 10:45 AM

## 2012-06-26 NOTE — Patient Instructions (Signed)

## 2012-06-27 ENCOUNTER — Encounter (HOSPITAL_COMMUNITY): Payer: Self-pay | Admitting: Anesthesiology

## 2012-06-27 ENCOUNTER — Observation Stay (HOSPITAL_COMMUNITY): Payer: BC Managed Care – PPO

## 2012-06-27 ENCOUNTER — Encounter (HOSPITAL_COMMUNITY): Admission: AD | Disposition: A | Discharge status: Home / Self Care | Payer: Self-pay | Source: Ambulatory Visit

## 2012-06-27 ENCOUNTER — Encounter (HOSPITAL_COMMUNITY): Payer: Self-pay | Admitting: *Deleted

## 2012-06-27 ENCOUNTER — Observation Stay (HOSPITAL_COMMUNITY): Payer: BC Managed Care – PPO | Admitting: Anesthesiology

## 2012-06-27 DIAGNOSIS — K8010 Calculus of gallbladder with chronic cholecystitis without obstruction: Secondary | ICD-10-CM

## 2012-06-27 HISTORY — PX: CHOLECYSTECTOMY: SHX55

## 2012-06-27 LAB — CBC
HCT: 31.5 % — ABNORMAL LOW (ref 36.0–46.0)
MCHC: 33.3 g/dL (ref 30.0–36.0)
MCV: 92.6 fL (ref 78.0–100.0)
RDW: 14.1 % (ref 11.5–15.5)
WBC: 7.6 10*3/uL (ref 4.0–10.5)

## 2012-06-27 LAB — LIPASE, BLOOD: Lipase: 24 U/L (ref 11–59)

## 2012-06-27 LAB — CREATININE, SERUM: GFR calc Af Amer: >90 mL/min (ref >90)

## 2012-06-27 SURGERY — LAPAROSCOPIC CHOLECYSTECTOMY WITH INTRAOPERATIVE CHOLANGIOGRAM
Anesthesia: General | Site: Abdomen | Wound class: Clean Contaminated

## 2012-06-27 MED ORDER — ONDANSETRON HCL 4 MG PO TABS: 8.0000 mg | ORAL_TABLET | Freq: Three times a day (TID) | ORAL | Status: DC | PRN

## 2012-06-27 MED ORDER — LIDOCAINE HCL (CARDIAC) 20 MG/ML IV SOLN
INTRAVENOUS | Status: DC | PRN
  Administered 2012-06-27: 75 mg via INTRAVENOUS

## 2012-06-27 MED ORDER — FENTANYL CITRATE 0.05 MG/ML IJ SOLN: 25.0000 ug | INTRAMUSCULAR | Status: DC | PRN

## 2012-06-27 MED ORDER — GLYCOPYRROLATE 0.2 MG/ML IJ SOLN
INTRAMUSCULAR | Status: DC | PRN
  Administered 2012-06-27: .4 mg via INTRAVENOUS

## 2012-06-27 MED ORDER — METOCLOPRAMIDE HCL 5 MG/ML IJ SOLN
INTRAMUSCULAR | Status: DC | PRN
  Administered 2012-06-27: 5 mg via INTRAVENOUS

## 2012-06-27 MED ORDER — SCOPOLAMINE 1 MG/3DAYS TD PT72
MEDICATED_PATCH | TRANSDERMAL | Status: DC | PRN
  Administered 2012-06-27: 1 via TRANSDERMAL

## 2012-06-27 MED ORDER — 0.9 % SODIUM CHLORIDE (POUR BTL) OPTIME
TOPICAL | Status: DC | PRN
  Administered 2012-06-27: 1000 mL

## 2012-06-27 MED ORDER — DEXAMETHASONE SODIUM PHOSPHATE 10 MG/ML IJ SOLN
INTRAMUSCULAR | Status: DC | PRN
  Administered 2012-06-27: 10 mg via INTRAVENOUS

## 2012-06-27 MED ORDER — CITALOPRAM HYDROBROMIDE 20 MG PO TABS
20.0000 mg | ORAL_TABLET | Freq: Every day | ORAL | Status: DC
  Administered 2012-06-27 – 2012-06-28 (×2): 20 mg via ORAL
  Filled 2012-06-27 (×2): qty 1

## 2012-06-27 MED ORDER — DIATRIZOATE MEGLUMINE 30 % UR SOLN
URETHRAL | Status: DC | PRN
  Administered 2012-06-27: 300 mL via URETHRAL

## 2012-06-27 MED ORDER — PROMETHAZINE HCL 25 MG/ML IJ SOLN: 6.2500 mg | INTRAMUSCULAR | Status: DC | PRN

## 2012-06-27 MED ORDER — CISATRACURIUM BESYLATE (PF) 10 MG/5ML IV SOLN
INTRAVENOUS | Status: DC | PRN
  Administered 2012-06-27: 2 mg via INTRAVENOUS
  Administered 2012-06-27: 6 mg via INTRAVENOUS

## 2012-06-27 MED ORDER — MIDAZOLAM HCL 5 MG/5ML IJ SOLN
INTRAMUSCULAR | Status: DC | PRN
  Administered 2012-06-27: 1 mg via INTRAVENOUS
  Administered 2012-06-27: .5 mg via INTRAVENOUS

## 2012-06-27 MED ORDER — PROPRANOLOL HCL 80 MG PO TABS: 80.0000 mg | ORAL_TABLET | Freq: Every day | ORAL | Status: DC

## 2012-06-27 MED ORDER — LACTATED RINGERS IV SOLN
INTRAVENOUS | Status: DC | PRN
  Administered 2012-06-27: 1000 mL via INTRAVENOUS

## 2012-06-27 MED ORDER — LACTATED RINGERS IV SOLN
INTRAVENOUS | Status: DC | PRN
  Administered 2012-06-27 (×2): via INTRAVENOUS

## 2012-06-27 MED ORDER — NEOSTIGMINE METHYLSULFATE 1 MG/ML IJ SOLN
INTRAMUSCULAR | Status: DC | PRN
  Administered 2012-06-27: 2 mg via INTRAVENOUS

## 2012-06-27 MED ORDER — KCL IN DEXTROSE-NACL 20-5-0.45 MEQ/L-%-% IV SOLN
INTRAVENOUS | Status: DC
  Administered 2012-06-27 – 2012-06-28 (×2): via INTRAVENOUS
  Filled 2012-06-27 (×3): qty 1000

## 2012-06-27 MED ORDER — NAPROXEN SODIUM 220 MG PO TABS: 220.0000 mg | ORAL_TABLET | Freq: Every day | ORAL | Status: DC

## 2012-06-27 MED ORDER — NAPROXEN 250 MG PO TABS
250.0000 mg | ORAL_TABLET | Freq: Every day | ORAL | Status: DC
  Administered 2012-06-27 – 2012-06-28 (×2): 250 mg via ORAL
  Filled 2012-06-27 (×3): qty 1

## 2012-06-27 MED ORDER — PROPRANOLOL HCL 80 MG PO TABS
80.0000 mg | ORAL_TABLET | Freq: Every morning | ORAL | Status: DC
  Administered 2012-06-27 – 2012-06-28 (×2): 80 mg via ORAL
  Filled 2012-06-27 (×2): qty 1

## 2012-06-27 MED ORDER — HYDROMORPHONE HCL PF 2 MG/ML IJ SOLN
2.0000 mg | INTRAMUSCULAR | Status: DC | PRN
  Administered 2012-06-27: 2 mg via INTRAVENOUS
  Filled 2012-06-27: qty 1

## 2012-06-27 MED ORDER — OXYCODONE-ACETAMINOPHEN 5-325 MG PO TABS
1.0000 | ORAL_TABLET | ORAL | Status: DC | PRN
  Administered 2012-06-27: 1 via ORAL
  Administered 2012-06-28: 2 via ORAL
  Filled 2012-06-27 (×3): qty 2

## 2012-06-27 MED ORDER — BUPIVACAINE HCL (PF) 0.25 % IJ SOLN
INTRAMUSCULAR | Status: DC | PRN
  Administered 2012-06-27: 30 mL

## 2012-06-27 MED ORDER — ONDANSETRON HCL 4 MG/2ML IJ SOLN
INTRAMUSCULAR | Status: DC | PRN
  Administered 2012-06-27 (×2): 2 mg via INTRAVENOUS

## 2012-06-27 MED ORDER — PROPOFOL 10 MG/ML IV EMUL
INTRAVENOUS | Status: DC | PRN
  Administered 2012-06-27: 200 mg via INTRAVENOUS

## 2012-06-27 MED ORDER — HYDROCHLOROTHIAZIDE 25 MG PO TABS
25.0000 mg | ORAL_TABLET | Freq: Every day | ORAL | Status: DC
  Administered 2012-06-27 – 2012-06-28 (×2): 25 mg via ORAL
  Filled 2012-06-27 (×2): qty 1

## 2012-06-27 MED ORDER — OXYCODONE-ACETAMINOPHEN 5-325 MG PO TABS: 1.0000 | ORAL_TABLET | ORAL | Status: DC | PRN

## 2012-06-27 MED ORDER — SUCCINYLCHOLINE CHLORIDE 20 MG/ML IJ SOLN
INTRAMUSCULAR | Status: DC | PRN
  Administered 2012-06-27: 100 mg via INTRAVENOUS

## 2012-06-27 MED ORDER — MENTHOL 3 MG MT LOZG
1.0000 | LOZENGE | OROMUCOSAL | Status: DC | PRN
  Filled 2012-06-27: qty 9

## 2012-06-27 MED ORDER — ASPIRIN 81 MG PO CHEW
81.0000 mg | CHEWABLE_TABLET | Freq: Every day | ORAL | Status: DC
  Administered 2012-06-27 – 2012-06-28 (×2): 81 mg via ORAL
  Filled 2012-06-27 (×2): qty 1

## 2012-06-27 MED ORDER — PROPOFOL INFUSION 10 MG/ML OPTIME
INTRAVENOUS | Status: DC | PRN
  Administered 2012-06-27: 140 ug/kg/min via INTRAVENOUS

## 2012-06-27 MED ORDER — KETOROLAC TROMETHAMINE 30 MG/ML IJ SOLN
15.0000 mg | Freq: Once | INTRAMUSCULAR | Status: AC | PRN
Start: 2012-06-27 — End: 2012-06-27

## 2012-06-27 MED ORDER — FENTANYL CITRATE 0.05 MG/ML IJ SOLN
INTRAMUSCULAR | Status: DC | PRN
  Administered 2012-06-27: 50 ug via INTRAVENOUS
  Administered 2012-06-27: 100 ug via INTRAVENOUS
  Administered 2012-06-27: 50 ug via INTRAVENOUS

## 2012-06-27 SURGICAL SUPPLY — 41 items
APPLIER CLIP ROT 10 11.4 M/L (STAPLE) ×2
CANISTER SUCTION 2500CC (MISCELLANEOUS) ×2 IMPLANT
CHLORAPREP W/TINT 10.5 ML (MISCELLANEOUS) IMPLANT
CHLORAPREP W/TINT 26ML (MISCELLANEOUS) ×2 IMPLANT
CLIP APPLIE ROT 10 11.4 M/L (STAPLE) ×1 IMPLANT
CLOTH BEACON ORANGE TIMEOUT ST (SAFETY) ×2 IMPLANT
COVER MAYO STAND STRL (DRAPES) ×2 IMPLANT
DECANTER SPIKE VIAL GLASS SM (MISCELLANEOUS) IMPLANT
DERMABOND ADVANCED (GAUZE/BANDAGES/DRESSINGS) ×1
DERMABOND ADVANCED .7 DNX12 (GAUZE/BANDAGES/DRESSINGS) ×1 IMPLANT
DRAPE C-ARM 42X72 X-RAY (DRAPES) ×2 IMPLANT
DRAPE LAPAROSCOPIC ABDOMINAL (DRAPES) ×2 IMPLANT
ELECT REM PT RETURN 9FT ADLT (ELECTROSURGICAL) ×2
ELECTRODE REM PT RTRN 9FT ADLT (ELECTROSURGICAL) ×1 IMPLANT
FILTER SMOKE EVAC LAPAROSHD (FILTER) ×2 IMPLANT
GLOVE BIOGEL PI IND STRL 7.0 (GLOVE) ×1 IMPLANT
GLOVE BIOGEL PI INDICATOR 7.0 (GLOVE) ×1
GLOVE EUDERMIC 7 POWDERFREE (GLOVE) ×2 IMPLANT
GOWN STRL NON-REIN LRG LVL3 (GOWN DISPOSABLE) IMPLANT
GOWN STRL REIN XL XLG (GOWN DISPOSABLE) ×10 IMPLANT
HEMOSTAT SURGICEL 4X8 (HEMOSTASIS) IMPLANT
IV SET EXT 30 76VOL 4 MALE LL (IV SETS) IMPLANT
KIT BASIN OR (CUSTOM PROCEDURE TRAY) ×2 IMPLANT
NS IRRIG 1000ML POUR BTL (IV SOLUTION) ×2 IMPLANT
POUCH SPECIMEN RETRIEVAL 10MM (ENDOMECHANICALS) ×2 IMPLANT
SET CHOLANGIOGRAPH MIX (MISCELLANEOUS) ×2 IMPLANT
SET IRRIG TUBING LAPAROSCOPIC (IRRIGATION / IRRIGATOR) ×2 IMPLANT
SLEEVE Z-THREAD 5X100MM (TROCAR) IMPLANT
SOLUTION ANTI FOG 6CC (MISCELLANEOUS) ×2 IMPLANT
STOPCOCK K 69 2C6206 (IV SETS) IMPLANT
STRIP CLOSURE SKIN 1/4X3 (GAUZE/BANDAGES/DRESSINGS) ×4 IMPLANT
SUT MNCRL AB 4-0 PS2 18 (SUTURE) ×4 IMPLANT
TOWEL OR 17X26 10 PK STRL BLUE (TOWEL DISPOSABLE) ×2 IMPLANT
TOWEL OR NON WOVEN STRL DISP B (DISPOSABLE) ×2 IMPLANT
TRAY LAP CHOLE (CUSTOM PROCEDURE TRAY) ×2 IMPLANT
TROCAR BLADELESS OPT 5 100 (ENDOMECHANICALS) ×4 IMPLANT
TROCAR XCEL BLUNT TIP 100MML (ENDOMECHANICALS) ×2 IMPLANT
TROCAR XCEL NON-BLD 11X100MML (ENDOMECHANICALS) ×2 IMPLANT
TROCAR Z-THREAD FIOS 11X100 BL (TROCAR) IMPLANT
TROCAR Z-THREAD FIOS 5X100MM (TROCAR) IMPLANT
TUBING INSUFFLATION 10FT LAP (TUBING) ×2 IMPLANT

## 2012-06-27 NOTE — Care Management Note (Signed)
    Page 1 of 1   06/27/2012     12:08:08 PM   CARE MANAGEMENT NOTE 06/27/2012  Patient:  [name],[name]   Account Number:  [id number]  Date Initiated:  06/27/2012  Documentation initiated by:  Lorenda Ishihara  Subjective/Objective Assessment:   57 yo female admitted for lap chole. PTA lived at home alone.     Action/Plan:   Home when stable   Anticipated DC Date:  06/28/2012   Anticipated DC Plan:  HOME/SELF CARE      DC Planning Services  CM consult      Choice offered to / List presented to:             Status of service:  Completed, signed off Medicare Important Message given?   (If response is "NO", the following Medicare IM given date fields will be blank) Date Medicare IM given:   Date Additional Medicare IM given:    Discharge Disposition:  HOME/SELF CARE  Per UR Regulation:  Reviewed for med. necessity/level of care/duration of stay  If discussed at Long Length of Stay Meetings, dates discussed:    Comments:

## 2012-06-27 NOTE — H&P (View-Only) (Signed)
Subjective:    Patient ID: [name], female    DOB: 1956-04-05, 57 y.o.   MRN: [id number]  HPI Sick since 5 days ago. Started with uri, then sudden mid to right abdominal pain with vomiting. Has felt hot, chills. Pain worst at RUQ . Only abdominal surgery TAH. No hx of dxed GB problems, but has a long hx of RUQ ache after eating. Much worse now. Also has HA , has hx of migrains and htn.   Review of Systems  Constitutional: Positive for fever and fatigue.  HENT: Positive for congestion. Negative for neck pain.   Eyes: Negative.   Respiratory: Negative.   Cardiovascular: Negative.   Genitourinary: Negative.   Neurological: Positive for headaches. Negative for dizziness, weakness and numbness.  Psychiatric/Behavioral: Negative.        Objective:   Physical Exam  Vitals reviewed. Constitutional: She is oriented to person, place, and time. She appears well-developed and well-nourished. She appears distressed.  HENT:  Right Ear: External ear normal.  Left Ear: External ear normal.  Mouth/Throat: Oropharynx is clear and moist.  Eyes: EOM are normal. No scleral icterus.  Neck: Normal range of motion. Neck supple.  Cardiovascular: Normal rate, regular rhythm and normal heart sounds.   Pulmonary/Chest: Effort normal.  Abdominal: There is tenderness. There is guarding.  Musculoskeletal: Normal range of motion.  Neurological: She is alert and oriented to person, place, and time. No cranial nerve deficit. She exhibits normal muscle tone. Coordination normal.  Skin: Skin is warm and dry. No rash noted.  Psychiatric: She has a normal mood and affect.  Mid guarding  [facility] reading (PRIMARY) by  Dr.Isabell Bonafede. NAD,no free air, no obstruction, mild ileus Results for orders placed in visit on 06/16/12  POCT CBC      Result Value Range   WBC 6.3  4.6 - 10.2 K/uL   Lymph, poc 1.3  0.6 - 3.4   POC LYMPH PERCENT 21.3  10 - 50 %L   MID (cbc) 0.4  0 - 0.9   POC MID % 5.8  0 - 12 %M   POC  Granulocyte 4.6  2 - 6.9   Granulocyte percent 72.9  37 - 80 %G   RBC 4.58  4.04 - 5.48 M/uL   Hemoglobin 13.6  12.2 - 16.2 g/dL   HCT, POC 78.2  95.6 - 47.9 %   MCV 94.1  80 - 97 fL   MCH, POC 29.7  27 - 31.2 pg   MCHC 31.6 (*) 31.8 - 35.4 g/dL   RDW, POC 21.3     Platelet Count, POC 358  142 - 424 K/uL   MPV 9.5  0 - 99.8 fL  POCT UA - MICROSCOPIC ONLY      Result Value Range   WBC, Ur, HPF, POC 2-3     RBC, urine, microscopic 0-1     Bacteria, U Microscopic 1+     Mucus, UA neg     Epithelial cells, urine per micros 8-10     Crystals, Ur, HPF, POC neg     Casts, Ur, LPF, POC neg     Yeast, UA neg    POCT URINALYSIS DIPSTICK      Result Value Range   Color, UA amber     Clarity, UA sl cloudy     Glucose, UA neg     Bilirubin, UA moderate     Ketones, UA 40     Spec Grav, UA 1.025  Blood, UA neg     pH, UA 6.0     Protein, UA 100     Urobilinogen, UA 1.0     Nitrite, UA neg     Leukocytes, UA Negative            Assessment & Plan:  Schedule [place] abd for today Clear liquid diet/Zofran/HC RTC Saturday or Sunday f/up  Ultrasound revealed cholelithiasis, probable cause of her sxs. Will refer to surgery to consult/called and gave [place] report to Ms. Azucena Kuba

## 2012-06-27 NOTE — Anesthesia Postprocedure Evaluation (Signed)
  Anesthesia Post-op Note  Patient: [name]  Procedure(s) Performed: Procedure(s) (LRB): LAPAROSCOPIC CHOLECYSTECTOMY WITH INTRAOPERATIVE CHOLANGIOGRAM (N/A)  Patient Location: PACU  Anesthesia Type: General  Level of Consciousness: awake and alert   Airway and Oxygen Therapy: Patient Spontanous Breathing  Post-op Pain: mild  Post-op Assessment: Post-op Vital signs reviewed, Patient's Cardiovascular Status Stable, Respiratory Function Stable, Patent Airway and No signs of Nausea or vomiting  Last Vitals:  Filed Vitals:   06/27/12 0930  BP: 157/91  Pulse: 64  Temp:   Resp: 16    Post-op Vital Signs: stable   Complications: No apparent anesthesia complications

## 2012-06-27 NOTE — Interval H&P Note (Signed)
History and Physical Interval Note:  06/27/2012 7:15 AM  [name]  has presented today for surgery, with the diagnosis of acute cholecystitis  The various methods of treatment have been discussed with the patient and family. After consideration of risks, benefits and other options for treatment, the patient has consented to  Procedure(s): LAPAROSCOPIC CHOLECYSTECTOMY WITH INTRAOPERATIVE CHOLANGIOGRAM (N/A) as a surgical intervention .  The patient's history has been reviewed, patient examined, no change in status, stable for surgery.  I have reviewed the patient's chart and labs.  Questions were answered to the patient's satisfaction.     [name]

## 2012-06-27 NOTE — Anesthesia Preprocedure Evaluation (Addendum)
Anesthesia Evaluation  Patient identified by MRN, date of birth, ID band Patient awake    Reviewed: Allergy & Precautions, H&P , NPO status , Patient's Chart, lab work & pertinent test results  Airway Mallampati: II TM Distance: >3 FB Neck ROM: Full    Dental  (+) Caps and Dental Advisory Given   Pulmonary neg pulmonary ROS,  breath sounds clear to auscultation  Pulmonary exam normal       Cardiovascular hypertension, Pt. on medications Rhythm:Regular Rate:Normal     Neuro/Psych negative neurological ROS  negative psych ROS   GI/Hepatic negative GI ROS, Neg liver ROS,   Endo/Other  negative endocrine ROS  Renal/GU negative Renal ROS  negative genitourinary   Musculoskeletal negative musculoskeletal ROS (+)   Abdominal   Peds negative pediatric ROS (+)  Hematology negative hematology ROS (+)   Anesthesia Other Findings   Reproductive/Obstetrics negative OB ROS                          Anesthesia Physical Anesthesia Plan  ASA: II  Anesthesia Plan: General   Post-op Pain Management:    Induction: Intravenous  Airway Management Planned: Oral ETT  Additional Equipment:   Intra-op Plan:   Post-operative Plan: Extubation in OR  Informed Consent: I have reviewed the patients History and Physical, chart, labs and discussed the procedure including the risks, benefits and alternatives for the proposed anesthesia with the patient or authorized representative who has indicated his/her understanding and acceptance.   Dental advisory given  Plan Discussed with: CRNA and Surgeon  Anesthesia Plan Comments:         Anesthesia Quick Evaluation

## 2012-06-27 NOTE — Op Note (Signed)
[facility] Hochstein [birth year] [id number] 06/26/2012  Preoperative diagnosis: early acute calculus cholecystitis  Postoperative diagnosis: same  Procedure: laparoscopic cholecystectomy with intraoperative cholangiogram  Surgeon: Currie Paris, MD, FACS  Assistant surgeon: Dr. Glenna Fellows   Anesthesia: General  Clinical History and Indications: This patient has known gallstones and comes in today for cholecystectomy.  Description of procedure: The patient was seen in the preoperative area. I reviewed the plans for the procedure with her as well as the risks and complications. She had no further questions and wished to proceed.  The patient was taken to the operating room. After satisfactory general endotracheal anesthesia had been obtained the abdomen was prepped and draped. A time out was done.  0.25% plain Marcaine was used at all incisions. I made an umbilical incision, identified the fascia and opened that, and entered the peritoneal cavity under direct vision. A 0 Vicryl pursestring suture was placed and the Hasson cannula was introduced under direct vision and secured with the pursestring. The abdomen was inflated to 15 cm.  The camera was placed and there were no gross abnormalities. The patient was then placed in reverse Trendelenburg and tilted to the left. A 10/11 trocar was placed in the epigastrium and two 5 mm trochars placed laterally all under direct vision.  The gallbladder was tense and distended. There is very minimal early inflammatory changes noted. There some adhesions of omentum to the lower midline.  I opened the peritoneum around the cystic duct and identified a long segment of cystic duct and cystic artery. There appeared to be some stones in the cystic duct. A clip the artery and divided after it had a safe window behind it.  An intraoperative cholangiogram was then performed. A Cook catheter was introduced percutaneously and placed in the cystic duct. The  cholangiogram showed good filling of the common duct and hepatic radicals and free flow into the duodenum. No abnormalities were noted.  The catheter was removed and 3 clips placed on the stay side of the cystic duct. The duct was then divided.  Additional clips are placed on the cystic artery and it was divided. The gallbladder was then removed from below to above the coagulation current of the cautery. It was then placed in a bag to be retrieved later.  The abdomen was irrigated and a check for hemostasis along the bed of the gallbladder made. Once everything appeared to be dry we were able to move the camera to the epigastric port and removed the gallbladder through the umbilical port.  The abdomen was reinsufflated and a final check for hemostasis made. There is no evidence of bleeding or bile leakage. The lateral ports were removed under direct vision and there was no bleeding. The umbilical site was closed with a pursestring, watching with the camera in the epigastric port. The abdomen was then deflated through the epigastric port and that was removed. Skin was closed with 4-0 Monocryl subcuticular and Dermabond.  The patient tolerated the procedure well. There were no operative complications. EBL was minimal. All counts were correct.  Currie Paris, MD, FACS 06/27/2012 8:48 AM

## 2012-06-27 NOTE — Preoperative (Signed)
Beta Blockers   Reason not to administer Beta Blockers:Not Applicable took beta blocker within 24 hrs. bp in control

## 2012-06-27 NOTE — Transfer of Care (Signed)
Immediate Anesthesia Transfer of Care Note  Patient: [name]  Procedure(s) Performed: Procedure(s): LAPAROSCOPIC CHOLECYSTECTOMY WITH INTRAOPERATIVE CHOLANGIOGRAM (N/A)  Patient Location: PACU  Anesthesia Type:General  Level of Consciousness: awake, oriented, patient cooperative, lethargic and responds to stimulation  Airway & Oxygen Therapy: Patient Spontanous Breathing and Patient connected to face mask oxygen  Post-op Assessment: Report given to PACU RN, Post -op Vital signs reviewed and stable and Patient moving all extremities  Post vital signs: Reviewed and stable  Complications: No apparent anesthesia complications

## 2012-06-27 NOTE — Discharge Summary (Signed)
Physician Discharge Summary  Patient ID: [name] MRN: [id number] DOB/AGE: 10-31-1955 57 y.o.  Admit date: 06/26/2012 Discharge date: 06/28/2012 PCP: Tally Due, MD  Admission Diagnoses: Acute cholelithiasis  Discharge Diagnoses:  Principal Problem:   Calculus of gallbladder with acute cholecystitis, without mention of obstruction   PROCEDURES: laparoscopic cholecystectomy with intraoperative cholangiogram, 06/27/2012, Currie Paris, MD.     Hospital Course: [name] is a 57 y.o. female. Patient sent at the request of Dr. Perrin Maltese with history of epigastric abdominal pain and nausea intermittent for 2 months. She was seen last week for ultrasound showed gallstones without cholecystitis. Over last 3 days she has had constant epigastric and right upper quadrant pain getting more severe with more nausea today and has not gotten better with medications. She has radiation of pain to her back. US shows cholelithiasis, but no evidence of cholecystitis.  She has ongoing significant symptoms and was admitted for pain relief and cholecystectomy. She had surgery in the AM 2/25 and has done well.  She was seen the following AM by Dr. Jamey Ripa and was doing well.  She was discharged home that morning. Follow up in office 2 weeks. Condition on D/C:  Improved.  Disposition: 01-Home or Self Care      Discharge Orders   Future Orders Complete By Expires     Diet - low sodium heart healthy  As directed     Discharge instructions  As directed     Comments:      [facility] ______[facility] LAPAROSCOPIC SURGERY: POST OP INSTRUCTIONS Always review your discharge instruction sheet given to you by the facility where your surgery was performed. IF YOU HAVE DISABILITY OR FAMILY LEAVE FORMS, YOU MUST BRING THEM TO THE OFFICE FOR PROCESSING.   DO NOT GIVE THEM TO YOUR DOCTOR.  A prescription for pain medication may be given to you upon discharge.  Take your pain medication as  prescribed, if needed.  If narcotic pain medicine is not needed, then you may take acetaminophen (Tylenol) or ibuprofen (Advil) as needed. Take your usually prescribed medications unless otherwise directed. If you need a refill on your pain medication, please contact your pharmacy.  They will contact our office to request authorization. Prescriptions will not be filled after 5pm or on week-ends. You should follow a light diet the first few days after arrival home, such as soup and crackers, etc.  Be sure to include lots of fluids daily. Most patients will experience some swelling and bruising in the area of the incisions.  Ice packs will help.  Swelling and bruising can take several days to resolve.  It is common to experience some constipation if taking pain medication after surgery.  Increasing fluid intake and taking a stool softener (such as Colace) will usually help or prevent this problem from occurring.  A mild laxative (Milk of Magnesia or Miralax) should be taken according to package instructions if there are no bowel movements after 48 hours. Unless discharge instructions indicate otherwise, you may remove your bandages 24-48 hours after surgery, and you may shower at that time.  You may have steri-strips (small skin tapes) in place directly over the incision.  These strips should be left on the skin for 7-10 days.  If your surgeon used skin glue on the incision, you may shower in 24 hours.  The glue will flake off over the next 2-3 weeks.  Any sutures or staples will be removed at the office during your follow-up visit.  ACTIVITIES:  You may resume regular (light) daily activities beginning the next day-such as daily self-care, walking, climbing stairs-gradually increasing activities as tolerated.  You may have sexual intercourse when it is comfortable.  Refrain from any heavy lifting or straining until approved by your doctor. You may drive when you are no longer taking prescription pain  medication, you can comfortably wear a seatbelt, and you can safely maneuver your car and apply brakes. RETURN TO WORK:  __________________________________________________________ Bonita Quin should see your doctor in the office for a follow-up appointment approximately 2-3 weeks after your surgery.  Make sure that you call for this appointment within a day or two after you arrive home to insure a convenient appointment time. OTHER INSTRUCTIONS: __________________________________________________________________________________________________________________________ __________________________________________________________________________________________________________________________ WHEN TO CALL YOUR DOCTOR: Fever over 101.0 Inability to urinate Continued bleeding from incision. Increased pain, redness, or drainage from the incision. Increasing abdominal pain  The clinic staff is available to answer your questions during regular business hours.  Please don't hesitate to call and ask to speak to one of the nurses for clinical concerns.  If you have a medical emergency, go to the nearest emergency room or call 911.  A surgeon from [facility] is always on call at the hospital. [address], [address], [place], [place]  [address] ? [address] [address], [place], [place]   [address] [phone] ? [phone] ? FAX [phone] Web site: [email or web address]    Increase activity slowly  As directed     No dressing needed  As directed         Medication List    TAKE these medications       aspirin 81 MG tablet  Take 81 mg by mouth daily.     citalopram 40 MG tablet  Commonly known as:  CELEXA  Take 20 mg by mouth daily.     hydrochlorothiazide 25 MG tablet  Commonly known as:  HYDRODIURIL  Take 25 mg by mouth daily.     HYDROcodone-acetaminophen 5-325 MG per tablet  Commonly known as:  NORCO/VICODIN  Take 1 tablet by mouth every 6 (six) hours as needed for pain.      naproxen sodium 220 MG tablet  Commonly known as:  ANAPROX  Take 220 mg by mouth daily.     ondansetron 8 MG tablet  Commonly known as:  ZOFRAN  Take 1 tablet (8 mg total) by mouth every 8 (eight) hours as needed for nausea.     oxyCODONE-acetaminophen 5-325 MG per tablet  Commonly known as:  PERCOCET/ROXICET  Take 1-2 tablets by mouth every 4 (four) hours as needed.     oxyCODONE-acetaminophen 5-325 MG per tablet  Commonly known as:  ROXICET  Take 1 tablet by mouth every 4 (four) hours as needed for pain.     propranolol 80 MG tablet  Commonly known as:  INDERAL  Take 80 mg by mouth daily.       Follow-up Information   Follow up with Currie Paris, MD. Schedule an appointment as soon as possible for a visit in 2 weeks. (Ask for an appointment with the [facility] clinic.  If none available you can ask to see Dr. Jamey Ripa)    Contact information:   [address] [place] [address] [phone]       Signed: Sherrie George 06/28/2012, 2:26 PM

## 2012-06-28 ENCOUNTER — Telehealth (INDEPENDENT_AMBULATORY_CARE_PROVIDER_SITE_OTHER): Payer: Self-pay | Admitting: General Surgery

## 2012-06-28 MED ORDER — OXYCODONE-ACETAMINOPHEN 5-325 MG PO TABS: 1.0000 | ORAL_TABLET | ORAL | Status: DC | PRN

## 2012-06-28 NOTE — Progress Notes (Signed)
1 Day Post-Op   Assessment: s/p Procedure(s): LAPAROSCOPIC CHOLECYSTECTOMY WITH INTRAOPERATIVE CHOLANGIOGRAM Patient Active Problem List  Diagnosis  . HTN (hypertension)  . Migraine, unspecified, without mention of intractable migraine without mention of status migrainosus  . Calculus of gallbladder with acute cholecystitis, without mention of obstruction    Doing well  Plan: Discharge  Subjective: Fells much better than pre-op. Tolerating diet and minimal incisional pain  Objective: Vital signs in last 24 hours: Temp:  [97.2 F (36.2 C)-98.9 F (37.2 C)] 98.2 F (36.8 C) (02/26 0541) Pulse Rate:  [2-86] 74 (02/26 0541) Resp:  [10-18] 18 (02/26 0541) BP: (124-157)/(64-91) 124/80 mmHg (02/26 0541) SpO2:  [96 %-100 %] 96 % (02/26 0541)   Intake/Output from previous day: 02/25 0701 - 02/26 0700 In: 4111.3 [P.O.:1150; I.V.:2961.3] Out: 4050 [Urine:4050] Intake/Output this shift:     General appearance: alert, cooperative and no distress Resp: clear to auscultation bilaterally GI: Soft, tender only at incision, mild ecchymosis at epigastric incision  Incision: healing well  Lab Results:   Recent Labs  06/26/12 1226 06/27/12 1144  WBC 11.2* 7.6  HGB 10.4* 10.5*  HCT 31.1* 31.5*  PLT 482* 410*   BMET  Recent Labs  06/26/12 1226 06/27/12 1144  NA 137  --   K 3.9  --   CL 101  --   CO2 26  --   GLUCOSE 105*  --   BUN 11  --   CREATININE 0.72 0.61  CALCIUM 8.7  --    PT/INR No results found for this basename: LABPROT, INR,  in the last 72 hours ABG No results found for this basename: PHART, PCO2, PO2, HCO3,  in the last 72 hours  MEDS, Scheduled . aspirin  81 mg Oral Daily  . citalopram  20 mg Oral Daily  . enoxaparin (LOVENOX) injection  40 mg Subcutaneous Q24H  . hydrochlorothiazide  25 mg Oral Daily  . naproxen  250 mg Oral Q breakfast  . propranolol  80 mg Oral q morning - 10a    Studies/Results: Dg Cholangiogram Operative  06/27/2012   *RADIOLOGY REPORT*  Clinical Data:   Cholelithiasis  INTRAOPERATIVE CHOLANGIOGRAM  Technique:  Cholangiographic images from the C-arm fluoroscopic device were submitted for interpretation post-operatively.  Please see the procedural report for the amount of contrast and the fluoroscopy time utilized.  Comparison:  None  Findings:  No persistent filling defects in the common duct. Intrahepatic ducts are incompletely visualized, appearing decompressed centrally. Contrast passes into the duodenum.  IMPRESSION  Negative for retained common duct stone.   Original Report Authenticated By: D. Andria Rhein, MD       LOS: 2 days     Currie Paris, MD, [facility], [place] [phone]   06/28/2012 9:05 AM

## 2012-06-28 NOTE — Telephone Encounter (Signed)
No answer did not leave message

## 2012-06-29 ENCOUNTER — Telehealth (INDEPENDENT_AMBULATORY_CARE_PROVIDER_SITE_OTHER): Payer: Self-pay | Admitting: General Surgery

## 2012-06-29 ENCOUNTER — Encounter (HOSPITAL_COMMUNITY): Payer: Self-pay | Admitting: Surgery

## 2012-06-29 NOTE — Telephone Encounter (Signed)
I have attempted to call this patient x 2 days with no answer . I also tried emergency contact  [place] with not luck .

## 2012-07-05 ENCOUNTER — Telehealth: Payer: Self-pay

## 2012-07-05 DIAGNOSIS — R1011 Right upper quadrant pain: Secondary | ICD-10-CM

## 2012-07-05 DIAGNOSIS — R112 Nausea with vomiting, unspecified: Secondary | ICD-10-CM

## 2012-07-05 DIAGNOSIS — G43909 Migraine, unspecified, not intractable, without status migrainosus: Secondary | ICD-10-CM

## 2012-07-05 DIAGNOSIS — I10 Essential (primary) hypertension: Secondary | ICD-10-CM

## 2012-07-05 MED ORDER — HYDROCHLOROTHIAZIDE 25 MG PO TABS: 25.0000 mg | ORAL_TABLET | Freq: Every day | ORAL | Status: DC

## 2012-07-05 MED ORDER — CITALOPRAM HYDROBROMIDE 40 MG PO TABS: 40.0000 mg | ORAL_TABLET | Freq: Every day | ORAL | Status: DC

## 2012-07-05 MED ORDER — PROPRANOLOL HCL 80 MG PO TABS: 80.0000 mg | ORAL_TABLET | Freq: Every day | ORAL | Status: DC

## 2012-07-05 NOTE — Telephone Encounter (Signed)
Patient requests her meds to be sent in for her until she can come in April pended the ones she needs

## 2012-07-05 NOTE — Telephone Encounter (Signed)
Sent to pharmacy.  Follow up with Dr. Perrin Maltese as planned

## 2012-07-05 NOTE — Telephone Encounter (Signed)
PT HAS AN APPOINTMENT WITH DR. Perrin Maltese IN April.  ALL HER MEDICINES ARE NEEDING REFILLS AND HER REGULAR DOCTOR WONT FILL THEM. CAN WE REFILL?  [phone]

## 2012-07-18 ENCOUNTER — Ambulatory Visit (INDEPENDENT_AMBULATORY_CARE_PROVIDER_SITE_OTHER): Payer: BC Managed Care – PPO | Admitting: Surgery

## 2012-07-18 ENCOUNTER — Encounter (INDEPENDENT_AMBULATORY_CARE_PROVIDER_SITE_OTHER): Payer: Self-pay | Admitting: Surgery

## 2012-07-18 VITALS — BP 120/80 | HR 76 | Temp 97.8°F | Resp 18 | Ht 63.0 in | Wt 188.0 lb

## 2012-07-18 DIAGNOSIS — Z09 Encounter for follow-up examination after completed treatment for conditions other than malignant neoplasm: Secondary | ICD-10-CM

## 2012-07-18 NOTE — Patient Instructions (Signed)
We will see you again on an as needed basis. Please call the office at [phone] if you have any questions or concerns. Thank you for allowing us to take care of you.  

## 2012-07-18 NOTE — Progress Notes (Signed)
NAME: [name]       DOB: [birth year]           DATE: 07/18/2012       ZOX:[id number]   CC:  Chief Complaint  Patient presents with  . Routine Post Op    lap chole 06/27/2012     Impression:  The patient appears to be doing well, with improvement in her symptoms.  Plan:  She may resume full activity and regular diet. She  will followup with [place] on a p.r.n. basis. I did tell her that she may still have some foods that cause indigestion and ask her to call us if there are any questions, problems or concerns.  HPI:  This patient underwent a laparoscopic cholecystectomy with operative cholangiogram on 06/27/2012. She is in for her first postoperative visit. She notes that her incisional pain has resolved. Her preoperative symptoms have improved. She is not having problems with nausea, vomiting, diarrhea, fevers, chills, or urinary symptoms. She is tolerating diet. She feels that she is progressing well and nearly back to normal. PE:  VS: BP 120/80  Pulse 76  Temp(Src) 97.8 F (36.6 C) (Temporal)  Resp 18  Ht 5\' 3"  (1.6 m)  Wt 188 lb (85.276 kg)  BMI 33.31 kg/m2  General: The patient is alert and appears comfortable, NAD.  Abdomen: Soft and benign. The incisions are healing nicely. There are no apparent problems.  Data reviewed: IOC: Findings: No persistent filling defects in the common duct.  Intrahepatic ducts are incompletely visualized, appearing  decompressed centrally. Contrast passes into the duodenum.  IMPRESSION  Negative for retained common duct stone.  Pathology: Diagnosis Gallbladder CHRONIC CHOLECYSTITIS AND CHOLELITHIASIS. Abigail Miyamoto MD Pathologist, Electronic [employer]

## 2012-07-26 ENCOUNTER — Other Ambulatory Visit: Payer: Self-pay | Admitting: Physician Assistant

## 2012-07-28 ENCOUNTER — Telehealth: Payer: Self-pay

## 2012-07-28 MED ORDER — PROPRANOLOL HCL 80 MG PO TABS: 80.0000 mg | ORAL_TABLET | Freq: Every day | ORAL | Status: DC

## 2012-07-28 NOTE — Telephone Encounter (Signed)
PT WOULD LIKE TO SPEAK WITH SOMEONE REGARDING HER MEDS. STATES WE SENT ONE OF HER MEDS BUT NOT THE OTHER PLEASE CALL [phone]

## 2012-07-28 NOTE — Telephone Encounter (Signed)
What med is she requesting? She wants inderall, she has appt scheduled with Dr Perrin Maltese. This is sent in for her.

## 2012-08-14 ENCOUNTER — Encounter: Payer: Self-pay | Admitting: Internal Medicine

## 2012-08-14 ENCOUNTER — Ambulatory Visit (INDEPENDENT_AMBULATORY_CARE_PROVIDER_SITE_OTHER): Payer: BC Managed Care – PPO | Admitting: Internal Medicine

## 2012-08-14 VITALS — BP 124/80 | HR 62 | Temp 98.8°F | Resp 16 | Ht 62.5 in | Wt 184.2 lb

## 2012-08-14 DIAGNOSIS — Z1231 Encounter for screening mammogram for malignant neoplasm of breast: Secondary | ICD-10-CM

## 2012-08-14 DIAGNOSIS — D62 Acute posthemorrhagic anemia: Secondary | ICD-10-CM

## 2012-08-14 DIAGNOSIS — G43009 Migraine without aura, not intractable, without status migrainosus: Secondary | ICD-10-CM

## 2012-08-14 DIAGNOSIS — Z9089 Acquired absence of other organs: Secondary | ICD-10-CM

## 2012-08-14 DIAGNOSIS — G4700 Insomnia, unspecified: Secondary | ICD-10-CM | POA: Insufficient documentation

## 2012-08-14 DIAGNOSIS — I10 Essential (primary) hypertension: Secondary | ICD-10-CM

## 2012-08-14 DIAGNOSIS — M25569 Pain in unspecified knee: Secondary | ICD-10-CM | POA: Insufficient documentation

## 2012-08-14 DIAGNOSIS — Z1211 Encounter for screening for malignant neoplasm of colon: Secondary | ICD-10-CM

## 2012-08-14 DIAGNOSIS — Z79899 Other long term (current) drug therapy: Secondary | ICD-10-CM

## 2012-08-14 DIAGNOSIS — Z8379 Family history of other diseases of the digestive system: Secondary | ICD-10-CM

## 2012-08-14 LAB — CBC WITH DIFFERENTIAL/PLATELET
Basophils Absolute: 0 10*3/uL (ref 0.0–0.1)
HCT: 35.3 % — ABNORMAL LOW (ref 36.0–46.0)
Lymphocytes Relative: 25 % (ref 12–46)
Monocytes Absolute: 0.4 10*3/uL (ref 0.1–1.0)
Neutro Abs: 3.9 10*3/uL (ref 1.7–7.7)
Platelets: 434 10*3/uL — ABNORMAL HIGH (ref 150–400)
RDW: 13.7 % (ref 11.5–15.5)
WBC: 6.1 10*3/uL (ref 4.0–10.5)

## 2012-08-14 LAB — COMPREHENSIVE METABOLIC PANEL
ALT: 63 U/L — ABNORMAL HIGH (ref 0–35)
AST: 43 U/L — ABNORMAL HIGH (ref 0–37)
Albumin: 4.1 g/dL (ref 3.5–5.2)
Calcium: 9.3 mg/dL (ref 8.4–10.5)
Chloride: 101 mEq/L (ref 96–112)
Potassium: 3.7 mEq/L (ref 3.5–5.3)
Total Protein: 6.9 g/dL (ref 6.0–8.3)

## 2012-08-14 MED ORDER — ALPRAZOLAM 0.5 MG PO TABS: 0.5000 mg | ORAL_TABLET | Freq: Every evening | ORAL | Status: DC | PRN

## 2012-08-14 NOTE — Patient Instructions (Signed)
Potassium (K) Potassium is an electrolyte that helps regulate the amount of fluid in the body. It also stimulates muscle contraction and maintains a stable acid-base balance. Most of the body's potassium is inside of cells, and only a very small amount is in the blood. Because the amount in the blood is so small, minor changes can have big effects. PREPARATION FOR TEST Testing for potassium requires taking a blood sample taken by needle from a vein in the arm. The skin is cleaned thoroughly before the sample is drawn. There is no other special preparation needed. NORMAL FINDINGS  Adults: 3.5-5 mEq/L (3.5-5 mmol/L).  Premature neonates, cord blood: 5-10.2 mEq/L (5-10.2 mmol/L).  Premature neonates, 48 hours: 3-6 mEq/L (3-6 mmol/L).  Neonates: 3.7-5.9 mEq/L (3.7-5.9 mmol/L).  Neonates, cord blood: 5.6-12 mEq/L (5.6-12 mmol/L).  Infants: 4.1-5.3 mEq/L (4.1-5.3 mmol/L).  Children: 3.4-4.7 mEq/L (3.4-4.7 mmol/L). Ranges for normal findings may vary among different laboratories and hospitals. You should always check with your doctor after having lab work or other tests done to discuss the meaning of your test results and whether your values are considered within normal limits. MEANING OF TEST Your caregiver will go over the test results with you and discuss the importance and meaning of your results, as well as treatment options and the need for additional tests if necessary. OBTAINING THE TEST RESULTS It is your responsibility to obtain your test results. Ask the lab or department performing the test when and how you will get your results. Document Released: 05/22/2004 Document Revised: 07/12/2011 Document Reviewed: 03/31/2008 [facility] Patient Information 2013 [place], [place]. Constipation, Adult Constipation is when a person has fewer than 3 bowel movements a week; has difficulty having a bowel movement; or has stools that are dry, hard, or larger than normal. As people grow older,  constipation is more common. If you try to fix constipation with medicines that make you have a bowel movement (laxatives), the problem may get worse. Long-term laxative use may cause the muscles of the colon to become weak. A low-fiber diet, not taking in enough fluids, and taking certain medicines may make constipation worse. CAUSES   Certain medicines, such as antidepressants, pain medicine, iron supplements, antacids, and water pills.   Certain diseases, such as diabetes, irritable bowel syndrome (IBS), thyroid disease, or depression.   Not drinking enough water.   Not eating enough fiber-rich foods.   Stress or travel.  Lack of physical activity or exercise.  Not going to the restroom when there is the urge to have a bowel movement.  Ignoring the urge to have a bowel movement.  Using laxatives too much. SYMPTOMS   Having fewer than 3 bowel movements a week.   Straining to have a bowel movement.   Having hard, dry, or larger than normal stools.   Feeling full or bloated.   Pain in the lower abdomen.  Not feeling relief after having a bowel movement. DIAGNOSIS  Your caregiver will take a medical history and perform a physical exam. Further testing may be done for severe constipation. Some tests may include:   A barium enema X-ray to examine your rectum, colon, and sometimes, your small intestine.  A sigmoidoscopy to examine your lower colon.  A colonoscopy to examine your entire colon. TREATMENT  Treatment will depend on the severity of your constipation and what is causing it. Some dietary treatments include drinking more fluids and eating more fiber-rich foods. Lifestyle treatments may include regular exercise. If these diet and lifestyle recommendations do not  help, your caregiver may recommend taking over-the-counter laxative medicines to help you have bowel movements. Prescription medicines may be prescribed if over-the-counter medicines do not work.  HOME  CARE INSTRUCTIONS   Increase dietary fiber in your diet, such as fruits, vegetables, whole grains, and beans. Limit high-fat and processed sugars in your diet, such as [place] fries, hamburgers, cookies, candies, and soda.   A fiber supplement may be added to your diet if you cannot get enough fiber from foods.   Drink enough fluids to keep your urine clear or pale yellow.   Exercise regularly or as directed by your caregiver.   Go to the restroom when you have the urge to go. Do not hold it.  Only take medicines as directed by your caregiver. Do not take other medicines for constipation without talking to your caregiver first. SEEK IMMEDIATE MEDICAL CARE IF:   You have bright red blood in your stool.   Your constipation lasts for more than 4 days or gets worse.   You have abdominal or rectal pain.   You have thin, pencil-like stools.  You have unexplained weight loss. MAKE SURE YOU:   Understand these instructions.  Will watch your condition.  Will get help right away if you are not doing well or get worse. Document Released: 01/16/2004 Document Revised: 07/12/2011 Document Reviewed: 03/23/2011 [facility] Patient Information 2013 [place], [place]. Insomnia Insomnia is frequent trouble falling and/or staying asleep. Insomnia can be a long term problem or a short term problem. Both are common. Insomnia can be a short term problem when the wakefulness is related to a certain stress or worry. Long term insomnia is often related to ongoing stress during waking hours and/or poor sleeping habits. Overtime, sleep deprivation itself can make the problem worse. Every little thing feels more severe because you are overtired and your ability to cope is decreased. CAUSES   Stress, anxiety, and depression.  Poor sleeping habits.  Distractions such as TV in the bedroom.  Naps close to bedtime.  Engaging in emotionally charged conversations before bed.  Technical reading before  sleep.  Alcohol and other sedatives. They may make the problem worse. They can hurt normal sleep patterns and normal dream activity.  Stimulants such as caffeine for several hours prior to bedtime.  Pain syndromes and shortness of breath can cause insomnia.  Exercise late at night.  Changing time zones may cause sleeping problems (jet lag). It is sometimes helpful to have someone observe your sleeping patterns. They should look for periods of not breathing during the night (sleep apnea). They should also look to see how long those periods last. If you live alone or observers are uncertain, you can also be observed at a sleep clinic where your sleep patterns will be professionally monitored. Sleep apnea requires a checkup and treatment. Give your caregivers your medical history. Give your caregivers observations your family has made about your sleep.  SYMPTOMS   Not feeling rested in the morning.  Anxiety and restlessness at bedtime.  Difficulty falling and staying asleep. TREATMENT   Your caregiver may prescribe treatment for an underlying medical disorders. Your caregiver can give advice or help if you are using alcohol or other drugs for self-medication. Treatment of underlying problems will usually eliminate insomnia problems.  Medications can be prescribed for short time use. They are generally not recommended for lengthy use.  Over-the-counter sleep medicines are not recommended for lengthy use. They can be habit forming.  You can promote easier sleeping by  making lifestyle changes such as:  Using relaxation techniques that help with breathing and reduce muscle tension.  Exercising earlier in the day.  Changing your diet and the time of your last meal. No night time snacks.  Establish a regular time to go to bed.  Counseling can help with stressful problems and worry.  Soothing music and white noise may be helpful if there are background noises you cannot remove.  Stop  tedious detailed work at least one hour before bedtime. HOME CARE INSTRUCTIONS   Keep a diary. Inform your caregiver about your progress. This includes any medication side effects. See your caregiver regularly. Take note of:  Times when you are asleep.  Times when you are awake during the night.  The quality of your sleep.  How you feel the next day. This information will help your caregiver care for you.  Get out of bed if you are still awake after 15 minutes. Read or do some quiet activity. Keep the lights down. Wait until you feel sleepy and go back to bed.  Keep regular sleeping and waking hours. Avoid naps.  Exercise regularly.  Avoid distractions at bedtime. Distractions include watching television or engaging in any intense or detailed activity like attempting to balance the household checkbook.  Develop a bedtime ritual. Keep a familiar routine of bathing, brushing your teeth, climbing into bed at the same time each night, listening to soothing music. Routines increase the success of falling to sleep faster.  Use relaxation techniques. This can be using breathing and muscle tension release routines. It can also include visualizing peaceful scenes. You can also help control troubling or intruding thoughts by keeping your mind occupied with boring or repetitive thoughts like the old concept of counting sheep. You can make it more creative like imagining planting one beautiful flower after another in your backyard garden.  During your day, work to eliminate stress. When this is not possible use some of the previous suggestions to help reduce the anxiety that accompanies stressful situations. MAKE SURE YOU:   Understand these instructions.  Will watch your condition.  Will get help right away if you are not doing well or get worse. Document Released: 04/16/2000 Document Revised: 07/12/2011 Document Reviewed: 05/17/2007 [facility] Patient Information 2013 [place], [place].

## 2012-08-14 NOTE — Progress Notes (Signed)
Subjective:    Patient ID: [name], female    DOB: 12-06-1955, 57 y.o.   MRN: [id number]  HPI New patient to me. Recovered from cholycystectomy, all wounds healed. Chronic legs aching hs long time. On diuretic. Chronic insomnia hard to fall asleep. Migraines controlled with Inderal. Has anemia, cause unclear, no hx of bleeding , will order colonoscopy.   Review of Systems cpe soon    Objective:   Physical Exam  Vitals reviewed. Constitutional: She is oriented to person, place, and time. She appears well-developed and well-nourished. No distress.  HENT:  Nose: Nose normal.  Eyes: EOM are normal. Pupils are equal, round, and reactive to light.  Neck: Normal range of motion. No thyromegaly present.  Cardiovascular: Normal rate, regular rhythm and normal heart sounds.   Abdominal: Soft. Bowel sounds are normal. She exhibits no mass. There is no tenderness.  Musculoskeletal: She exhibits tenderness.  Neurological: She is alert and oriented to person, place, and time. She exhibits normal muscle tone. Coordination normal.  Psychiatric: She has a normal mood and affect.   Appears well  Results for orders placed during the hospital encounter of 06/26/12  SURGICAL PCR SCREEN      Result Value Range   MRSA, PCR NEGATIVE  NEGATIVE   Staphylococcus aureus NEGATIVE  NEGATIVE  COMPREHENSIVE METABOLIC PANEL      Result Value Range   Sodium 137  135 - 145 mEq/L   Potassium 3.9  3.5 - 5.1 mEq/L   Chloride 101  96 - 112 mEq/L   CO2 26  19 - 32 mEq/L   Glucose, Bld 105 (*) 70 - 99 mg/dL   BUN 11  6 - 23 mg/dL   Creatinine, Ser 4.09  0.50 - 1.10 mg/dL   Calcium 8.7  8.4 - 81.1 mg/dL   Total Protein 6.7  6.0 - 8.3 g/dL   Albumin 3.7  3.5 - 5.2 g/dL   AST 20  0 - 37 U/L   ALT 18  0 - 35 U/L   Alkaline Phosphatase 63  39 - 117 U/L   Total Bilirubin 0.3  0.3 - 1.2 mg/dL   GFR calc non Af Amer >90  >90 mL/min   GFR calc Af Amer >90  >90 mL/min  CBC      Result Value Range   WBC 11.2  (*) 4.0 - 10.5 K/uL   RBC 3.41 (*) 3.87 - 5.11 MIL/uL   Hemoglobin 10.4 (*) 12.0 - 15.0 g/dL   HCT 91.4 (*) 78.2 - 95.6 %   MCV 91.2  78.0 - 100.0 fL   MCH 30.5  26.0 - 34.0 pg   MCHC 33.4  30.0 - 36.0 g/dL   RDW 21.3  08.6 - 57.8 %   Platelets 482 (*) 150 - 400 K/uL  LIPASE, BLOOD      Result Value Range   Lipase 24  11 - 59 U/L  CBC      Result Value Range   WBC 7.6  4.0 - 10.5 K/uL   RBC 3.40 (*) 3.87 - 5.11 MIL/uL   Hemoglobin 10.5 (*) 12.0 - 15.0 g/dL   HCT 46.9 (*) 62.9 - 52.8 %   MCV 92.6  78.0 - 100.0 fL   MCH 30.9  26.0 - 34.0 pg   MCHC 33.3  30.0 - 36.0 g/dL   RDW 41.3  24.4 - 01.0 %   Platelets 410 (*) 150 - 400 K/uL  CREATININE, SERUM      Result  Value Range   Creatinine, Ser 0.61  0.50 - 1.10 mg/dL   GFR calc non Af Amer >90  >90 mL/min   GFR calc Af Amer >90  >90 mL/min         Assessment & Plan:  Anemia started evaluation Insomnia/trial alprazolam .5 hs Constipation/trial miralax Chronic leg pain ck K/is on hctz Needs colonoscopy/mammogram OK to rf meds 6 months

## 2012-08-15 ENCOUNTER — Encounter: Payer: Self-pay | Admitting: Internal Medicine

## 2012-08-20 ENCOUNTER — Other Ambulatory Visit: Payer: Self-pay | Admitting: Physician Assistant

## 2012-08-21 ENCOUNTER — Ambulatory Visit (INDEPENDENT_AMBULATORY_CARE_PROVIDER_SITE_OTHER): Payer: BC Managed Care – PPO

## 2012-08-22 ENCOUNTER — Ambulatory Visit: Payer: BC Managed Care – PPO

## 2012-08-23 ENCOUNTER — Ambulatory Visit: Payer: BC Managed Care – PPO

## 2012-08-23 ENCOUNTER — Ambulatory Visit (INDEPENDENT_AMBULATORY_CARE_PROVIDER_SITE_OTHER): Payer: BC Managed Care – PPO | Admitting: Family Medicine

## 2012-08-23 VITALS — BP 120/64 | HR 60 | Temp 98.1°F | Resp 18 | Ht 63.0 in | Wt 185.0 lb

## 2012-08-23 DIAGNOSIS — J302 Other seasonal allergic rhinitis: Secondary | ICD-10-CM

## 2012-08-23 DIAGNOSIS — R202 Paresthesia of skin: Secondary | ICD-10-CM

## 2012-08-23 DIAGNOSIS — R748 Abnormal levels of other serum enzymes: Secondary | ICD-10-CM

## 2012-08-23 DIAGNOSIS — Z0000 Encounter for general adult medical examination without abnormal findings: Secondary | ICD-10-CM

## 2012-08-23 DIAGNOSIS — M79641 Pain in right hand: Secondary | ICD-10-CM

## 2012-08-23 DIAGNOSIS — R209 Unspecified disturbances of skin sensation: Secondary | ICD-10-CM

## 2012-08-23 DIAGNOSIS — M79609 Pain in unspecified limb: Secondary | ICD-10-CM

## 2012-08-23 DIAGNOSIS — J309 Allergic rhinitis, unspecified: Secondary | ICD-10-CM

## 2012-08-23 DIAGNOSIS — F411 Generalized anxiety disorder: Secondary | ICD-10-CM

## 2012-08-23 DIAGNOSIS — R7401 Elevation of levels of liver transaminase levels: Secondary | ICD-10-CM

## 2012-08-23 LAB — HEPATIC FUNCTION PANEL
Bilirubin, Direct: 0.1 mg/dL (ref 0.0–0.3)
Total Bilirubin: 0.4 mg/dL (ref 0.3–1.2)

## 2012-08-23 LAB — LIPID PANEL
Total CHOL/HDL Ratio: 4.7 Ratio
VLDL: 44 mg/dL — ABNORMAL HIGH (ref 0–40)

## 2012-08-23 MED ORDER — NAPROXEN SODIUM 550 MG PO TABS: 550.0000 mg | ORAL_TABLET | Freq: Two times a day (BID) | ORAL | Status: DC

## 2012-08-23 MED ORDER — LORATADINE 10 MG PO TABS: 10.0000 mg | ORAL_TABLET | Freq: Every day | ORAL | Status: DC

## 2012-08-23 MED ORDER — CITALOPRAM HYDROBROMIDE 20 MG PO TABS: 40.0000 mg | ORAL_TABLET | Freq: Every day | ORAL | Status: DC

## 2012-08-23 NOTE — Progress Notes (Signed)
[address] [place] [address]   Phone [phone]  Subjective:    Patient ID: [name], female    DOB: [birth year], 57 y.o.   MRN: [id number]  HPI Pt presents to clinic for her CPE.  She has not had no in a while.  She saw the eye dr last year as well as the dentist.  She is a self-employed IT consultant.  She has not had a mammogram in several years.  She has a colonoscopy scheduled 5/14.  Her only problem is that she is having extremity pain and paresthesias in her hands, the right one is worse than the left.  She is having trouble dropping things, hitting wrong keys on her keyboard and her words per minute have decreased.  She is not having neck or back pain.  She has h/o surgery on her neck when she was in her 30s and had similar symptoms then but they completely resolved and she started having these symptoms about a year ago and they are getting worse.  She has been on NSAIDs and they are not really helping the pain.  She uses Xanax at night to sleep because of the pain.   Review of Systems  HENT: Negative.   Eyes: Negative.   Respiratory: Negative.   Cardiovascular: Negative.   Gastrointestinal: Negative.   Endocrine: Negative.   Genitourinary: Negative.   Musculoskeletal: Positive for myalgias (R>L hand and arm - arm pain with paresthesias and numbness). Negative for joint swelling and gait problem.  Skin: Negative.   Allergic/Immunologic: Negative.   Neurological: Positive for weakness (hands) and numbness (hands).  Hematological: Negative.   Psychiatric/Behavioral: Negative.        Objective:   Physical Exam  Vitals reviewed. Constitutional: She is oriented to person, place, and time. She appears well-developed and well-nourished.  HENT:  Head: Normocephalic and atraumatic.  Right Ear: Hearing, tympanic membrane, external ear and ear canal normal.  Left Ear: Hearing, tympanic membrane, external ear and ear canal normal.  Nose: Nose normal.  Mouth/Throat: Uvula is  midline and oropharynx is clear and moist.  Eyes: Conjunctivae and EOM are normal. Pupils are equal, round, and reactive to light.  Neck: Normal range of motion. Neck supple. No spinous process tenderness and no muscular tenderness present. Normal range of motion present. No thyromegaly present.  Cardiovascular: Normal rate, regular rhythm, normal heart sounds and intact distal pulses.   No murmur heard. Pulmonary/Chest: Effort normal and breath sounds normal.  Abdominal: Soft. Bowel sounds are normal.  Genitourinary: Vagina normal. No breast swelling, tenderness, discharge or bleeding. Pelvic exam was performed with patient supine. No labial fusion. There is no rash, tenderness, lesion or injury on the right labia. There is no rash, tenderness, lesion or injury on the left labia. Right adnexum displays no mass, no tenderness and no fullness. Left adnexum displays no mass, no tenderness and no fullness. No erythema, tenderness or bleeding around the vagina. No foreign body around the vagina. No signs of injury around the vagina. No vaginal discharge found.  Fibrous breast esp in the upper outer quadrants.  Cervix not present due to s/p Hyst.  Musculoskeletal: Normal range of motion.       Cervical back: She exhibits normal range of motion, no tenderness, no bony tenderness and no spasm.  Pt has decreased grip strength B (R>L).  Pt has + Tinnels and phalen's bilaterally.  She has decreased strength of flexion and extension of fingers (esp 1-3 digits) with resistance.  She  has decreased sensation to monofilament testing on the R side starting mid arm and on the L in the hand and fingers.  She has decreased sensation to soft touch on bilateral hands.  She has normal strength and sensation on her legs/feet.  Lymphadenopathy:    She has no cervical adenopathy.  Neurological: She is alert and oriented to person, place, and time. She has normal reflexes. She displays no atrophy. A sensory deficit is present.  No cranial nerve deficit.  Reflex Scores:      Bicep reflexes are 2+ on the right side and 2+ on the left side.      Brachioradialis reflexes are 2+ on the right side and 2+ on the left side.      Patellar reflexes are 2+ on the right side and 2+ on the left side.      Achilles reflexes are 2+ on the right side and 2+ on the left side. Skin: Skin is warm and dry.  Psychiatric: She has a normal mood and affect. Her behavior is normal. Judgment and thought content normal.   Results for orders placed in visit on 08/14/12  CBC WITH DIFFERENTIAL      Result Value Range   WBC 6.1  4.0 - 10.5 K/uL   RBC 4.01  3.87 - 5.11 MIL/uL   Hemoglobin 11.3 (*) 12.0 - 15.0 g/dL   HCT 16.1 (*) 09.6 - 04.5 %   MCV 88.0  78.0 - 100.0 fL   MCH 28.2  26.0 - 34.0 pg   MCHC 32.0  30.0 - 36.0 g/dL   RDW 40.9  81.1 - 91.4 %   Platelets 434 (*) 150 - 400 K/uL   Neutrophils Relative 64  43 - 77 %   Neutro Abs 3.9  1.7 - 7.7 K/uL   Lymphocytes Relative 25  12 - 46 %   Lymphs Abs 1.5  0.7 - 4.0 K/uL   Monocytes Relative 6  3 - 12 %   Monocytes Absolute 0.4  0.1 - 1.0 K/uL   Eosinophils Relative 4  0 - 5 %   Eosinophils Absolute 0.3  0.0 - 0.7 K/uL   Basophils Relative 1  0 - 1 %   Basophils Absolute 0.0  0.0 - 0.1 K/uL   Smear Review Criteria for review not met    COMPREHENSIVE METABOLIC PANEL      Result Value Range   Sodium 140  135 - 145 mEq/L   Potassium 3.7  3.5 - 5.3 mEq/L   Chloride 101  96 - 112 mEq/L   CO2 31  19 - 32 mEq/L   Glucose, Bld 80  70 - 99 mg/dL   BUN 18  6 - 23 mg/dL   Creat 7.82  9.56 - 2.13 mg/dL   Total Bilirubin 0.4  0.3 - 1.2 mg/dL   Alkaline Phosphatase 88  39 - 117 U/L   AST 43 (*) 0 - 37 U/L   ALT 63 (*) 0 - 35 U/L   Total Protein 6.9  6.0 - 8.3 g/dL   Albumin 4.1  3.5 - 5.2 g/dL   Calcium 9.3  8.4 - 08.6 mg/dL  VITAMIN V78      Result Value Range   Vitamin B-12 440  211 - 911 pg/mL  IRON      Result Value Range   Iron 85  42 - 145 ug/dL    [facility] reading (PRIMARY)  by  Dr. Katrinka Blazing.  Spurring of C5, C6, C7  EKG - bradycardia but no acute findings.     Assessment & Plan:  PE (physical exam), annual - Plan: TSH, Lipid panel, EKG 12-Lead, MM Digital Screening, Amb ref to Medical Nutrition Therapy-MNT - other labs reviewed from her last visit for paresthesias and pain.  Pt is interested in nutrition counseling to help her lose weight by teaching her better dietary habits.  Paresthesias - seem to possible related to neck and probably carpal tunnel syndrome due to her symptoms being proximal to her wrist on the R side.  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 Pt put in a forearm wrist splint to help with her work and the pressure that her wrist gets while at work with typing.  Generalized anxiety disorder - Plan: citalopram (CELEXA) 20 MG tablet - continue this dose - pt is doing well with this dose.  Hypertension - well controlled on current medications.  Elevated liver enzymes - Plan: Hepatic Function Panel - recheck -   Seasonal allergies - Plan: loratadine (CLARITIN) 10 MG tablet - continue  [name] PA-C 08/23/2012 2:15 PM

## 2012-08-23 NOTE — Progress Notes (Signed)
History and physical examinations reviewed in detail with [name], PA-C.  C-spine film and EKG reviewed.  Agree with current A/P.

## 2012-08-24 ENCOUNTER — Encounter: Payer: Self-pay | Admitting: Family Medicine

## 2012-09-01 ENCOUNTER — Ambulatory Visit
Admission: RE | Admit: 2012-09-01 | Discharge: 2012-09-01 | Disposition: A | Discharge status: Home / Self Care | Payer: BC Managed Care – PPO | Source: Ambulatory Visit | Attending: Physician Assistant | Admitting: Physician Assistant

## 2012-09-01 DIAGNOSIS — Z0000 Encounter for general adult medical examination without abnormal findings: Secondary | ICD-10-CM

## 2012-09-10 ENCOUNTER — Other Ambulatory Visit: Payer: Self-pay | Admitting: Physician Assistant

## 2012-09-13 ENCOUNTER — Ambulatory Visit (AMBULATORY_SURGERY_CENTER): Payer: BC Managed Care – PPO | Admitting: *Deleted

## 2012-09-13 ENCOUNTER — Encounter: Payer: BC Managed Care – PPO | Admitting: Physician Assistant

## 2012-09-13 ENCOUNTER — Encounter: Payer: Self-pay | Admitting: Internal Medicine

## 2012-09-13 VITALS — Ht 63.0 in | Wt 186.0 lb

## 2012-09-13 DIAGNOSIS — Z1211 Encounter for screening for malignant neoplasm of colon: Secondary | ICD-10-CM

## 2012-09-13 MED ORDER — MOVIPREP 100 G PO SOLR
ORAL | Status: DC
Start: 2012-09-13 — End: 2012-09-27

## 2012-09-27 ENCOUNTER — Ambulatory Visit (AMBULATORY_SURGERY_CENTER): Payer: BC Managed Care – PPO | Admitting: Internal Medicine

## 2012-09-27 ENCOUNTER — Encounter: Payer: Self-pay | Admitting: Internal Medicine

## 2012-09-27 VITALS — BP 124/74 | HR 53 | Temp 97.8°F | Resp 23 | Ht 63.0 in | Wt 186.0 lb

## 2012-09-27 DIAGNOSIS — Z1211 Encounter for screening for malignant neoplasm of colon: Secondary | ICD-10-CM

## 2012-09-27 MED ORDER — SODIUM CHLORIDE 0.9 % IV SOLN: 500.0000 mL | INTRAVENOUS | Status: DC

## 2012-09-27 NOTE — Op Note (Signed)
[facility] [address] [place], [address]   COLONOSCOPY PROCEDURE REPORT  PATIENT: [name], [name]  MR#: [id number] BIRTHDATE: January 02, 1956 , 57  yrs. old GENDER: Female ENDOSCOPIST: Hart Carwin, MD REFERRED BY:  Robert Bellow, M.D. PROCEDURE DATE:  09/27/2012 PROCEDURE:   Colonoscopy, screening ASA CLASS:   Class I INDICATIONS:Average risk patient for colon cancer. MEDICATIONS: MAC sedation, administered by CRNA and propofol (Diprivan) 300mg  IV  DESCRIPTION OF PROCEDURE:   After the risks and benefits and of the procedure were explained, informed consent was obtained.  A digital rectal exam revealed no abnormalities of the rectum.    The [id number]  endoscope was introduced through the anus and advanced to the cecum, which was identified by both the appendix and ileocecal valve .  The quality of the prep was excellent, using MoviPrep .  The instrument was then slowly withdrawn as the colon was fully examined.     COLON FINDINGS: A normal appearing cecum, ileocecal valve, and appendiceal orifice were identified.  The ascending, hepatic flexure, transverse, splenic flexure, descending, sigmoid colon and rectum appeared unremarkable.  No polyps or cancers were seen. Retroflexed views revealed no abnormalities.     The scope was then withdrawn from the patient and the procedure completed.  COMPLICATIONS: There were no complications. ENDOSCOPIC IMPRESSION: Normal colon  RECOMMENDATIONS: High fiber diet   REPEAT EXAM: In 10 year(s)  for Colonoscopy.  cc:  _______________________________ eSignedHart Carwin, MD 09/27/2012 1:59 PM

## 2012-09-27 NOTE — Progress Notes (Signed)
Patient did not experience any of the following events: a burn prior to discharge; a fall within the facility; wrong site/side/patient/procedure/implant event; or a hospital transfer or hospital admission upon discharge from the facility. (G8907) Patient did not have preoperative order for IV antibiotic SSI prophylaxis. (G8918)  

## 2012-09-27 NOTE — Patient Instructions (Addendum)
Discharge instructions given with verbal understanding. Normal exam. Resume previous medications. YOU HAD AN ENDOSCOPIC PROCEDURE TODAY AT THE [facility]: Refer to the procedure report that was given to you for any specific questions about what was found during the examination.  If the procedure report does not answer your questions, please call your gastroenterologist to clarify.  If you requested that your care partner not be given the details of your procedure findings, then the procedure report has been included in a sealed envelope for you to review at your convenience later.  YOU SHOULD EXPECT: Some feelings of bloating in the abdomen. Passage of more gas than usual.  Walking can help get rid of the air that was put into your GI tract during the procedure and reduce the bloating. If you had a lower endoscopy (such as a colonoscopy or flexible sigmoidoscopy) you may notice spotting of blood in your stool or on the toilet paper. If you underwent a bowel prep for your procedure, then you may not have a normal bowel movement for a few days.  DIET: Your first meal following the procedure should be a light meal and then it is ok to progress to your normal diet.  A half-sandwich or bowl of soup is an example of a good first meal.  Heavy or fried foods are harder to digest and may make you feel nauseous or bloated.  Likewise meals heavy in dairy and vegetables can cause extra gas to form and this can also increase the bloating.  Drink plenty of fluids but you should avoid alcoholic beverages for 24 hours.  ACTIVITY: Your care partner should take you home directly after the procedure.  You should plan to take it easy, moving slowly for the rest of the day.  You can resume normal activity the day after the procedure however you should NOT DRIVE or use heavy machinery for 24 hours (because of the sedation medicines used during the test).    SYMPTOMS TO REPORT IMMEDIATELY: A gastroenterologist  can be reached at any hour.  During normal business hours, 8:30 AM to 5:00 PM Monday through Friday, call [phone].  After hours and on weekends, please call the GI answering service at [phone] who will take a message and have the physician on call contact you.   Following lower endoscopy (colonoscopy or flexible sigmoidoscopy):  Excessive amounts of blood in the stool  Significant tenderness or worsening of abdominal pains  Swelling of the abdomen that is new, acute  Fever of 100F or higher  FOLLOW UP: If any biopsies were taken you will be contacted by phone or by letter within the next 1-3 weeks.  Call your gastroenterologist if you have not heard about the biopsies in 3 weeks.  Our staff will call the home number listed on your records the next business day following your procedure to check on you and address any questions or concerns that you may have at that time regarding the information given to you following your procedure. This is a courtesy call and so if there is no answer at the home number and we have not heard from you through the emergency physician on call, we will assume that you have returned to your regular daily activities without incident.  SIGNATURES/CONFIDENTIALITY: You and/or your care partner have signed paperwork which will be entered into your electronic medical record.  These signatures attest to the fact that that the information above on your After Visit Summary has been reviewed   and is understood.  Full responsibility of the confidentiality of this discharge information lies with you and/or your care-partner. 

## 2012-09-28 ENCOUNTER — Telehealth: Payer: Self-pay | Admitting: *Deleted

## 2012-09-28 NOTE — Telephone Encounter (Signed)
  Follow up Call-  Call back number 09/27/2012  Post procedure Call Back phone  # [phone]  Permission to leave phone message Yes     Patient questions:  Do you have a fever, pain , or abdominal swelling? no Pain Score  0 *  Have you tolerated food without any problems? yes  Have you been able to return to your normal activities? yes  Do you have any questions about your discharge instructions: Diet   no Medications  no Follow up visit  no  Do you have questions or concerns about your Care? no  Actions: * If pain score is 4 or above: No action needed, pain <4.

## 2012-10-18 ENCOUNTER — Telehealth: Payer: Self-pay

## 2012-10-18 MED ORDER — HYDROCHLOROTHIAZIDE 25 MG PO TABS: ORAL_TABLET | ORAL | Status: DC

## 2012-10-18 NOTE — Telephone Encounter (Signed)
PATIENT IS WONDERING WHY WE ARE NOT RESPONDING TO RX REFILLS REQUEST FROM PHARMACY [phone]

## 2012-10-18 NOTE — Telephone Encounter (Signed)
Called her, she is requesting HCTZ, this is sent in for her. She is transferred to make appt with Maralyn Sago for follow up

## 2012-11-08 ENCOUNTER — Other Ambulatory Visit: Payer: Self-pay | Admitting: Physician Assistant

## 2012-11-08 ENCOUNTER — Ambulatory Visit (INDEPENDENT_AMBULATORY_CARE_PROVIDER_SITE_OTHER): Payer: BC Managed Care – PPO | Admitting: Physician Assistant

## 2012-11-08 ENCOUNTER — Encounter: Payer: Self-pay | Admitting: Physician Assistant

## 2012-11-08 VITALS — BP 131/72 | HR 64 | Temp 98.2°F | Resp 16 | Ht 63.5 in | Wt 189.0 lb

## 2012-11-08 DIAGNOSIS — I10 Essential (primary) hypertension: Secondary | ICD-10-CM

## 2012-11-08 DIAGNOSIS — R748 Abnormal levels of other serum enzymes: Secondary | ICD-10-CM

## 2012-11-08 DIAGNOSIS — M47812 Spondylosis without myelopathy or radiculopathy, cervical region: Secondary | ICD-10-CM

## 2012-11-08 DIAGNOSIS — G5600 Carpal tunnel syndrome, unspecified upper limb: Secondary | ICD-10-CM

## 2012-11-08 DIAGNOSIS — G4700 Insomnia, unspecified: Secondary | ICD-10-CM

## 2012-11-08 DIAGNOSIS — G5602 Carpal tunnel syndrome, left upper limb: Secondary | ICD-10-CM

## 2012-11-08 MED ORDER — TRAZODONE HCL 50 MG PO TABS: 50.0000 mg | ORAL_TABLET | Freq: Every day | ORAL | Status: DC

## 2012-11-08 MED ORDER — HYDROCHLOROTHIAZIDE 25 MG PO TABS: ORAL_TABLET | ORAL | Status: DC

## 2012-11-08 MED ORDER — NAPROXEN SODIUM 550 MG PO TABS: 550.0000 mg | ORAL_TABLET | Freq: Two times a day (BID) | ORAL | Status: DC

## 2012-11-08 NOTE — Progress Notes (Signed)
   [address] [place] [address]   Phone [phone]   Subjective:    Patient ID: [name], female    DOB: 10-19-1955, 57 y.o.   MRN: [id number]  HPI Pt presents to clinic for recheck.  She has been doing really well.  She had her carpal tunnel released on her R hand 2 weeks ago and has surgery scheduled for her L hand next week.  She has also developed trigger fingers in both her 4th digit but they are going to also fix that next week.  She needs her LFT rechecked - she drinks no ETOH.  She is still having problems with insomnia - the Xanax only helps a little with sleep - she has to take 2 pills and then she only sleeps about 4hrs.  The OTC sleep aids help with her sleep better than the Xanax.  She needs refills of her Anaprox it works the best for her pain and she only really has to take it once daily.   Review of Systems     Objective:   Physical Exam  Vitals reviewed. Constitutional: She is oriented to person, place, and time. She appears well-developed and well-nourished.  HENT:  Head: Normocephalic and atraumatic.  Right Ear: External ear normal.  Left Ear: External ear normal.  Cardiovascular: Normal rate, regular rhythm and normal heart sounds.   Pulmonary/Chest: Effort normal and breath sounds normal.  Neurological: She is alert and oriented to person, place, and time.  Skin: Skin is warm and dry.  Psychiatric: She has a normal mood and affect. Her behavior is normal. Judgment and thought content normal.          Assessment & Plan:  Abnormal liver enzymes - Plan: Hepatic Function Panel - if still elevated we will add a Hepatitis Panel.  Carpal tunnel syndrome, left - Plan: naproxen sodium (ANAPROX DS) 550 MG tablet   Cervical arthritis - Plan: naproxen sodium (ANAPROX DS) 550 MG tablet  Insomnia - Plan: traZODone (DESYREL) 50 MG tablet - pt will starts with 1 pill at night for 3-4 nights and then increase to 3 pills until she gets good sleep - she is ok to take  Xanax with this if she is having a lot of anxiety associated with nighttime. Pt to call with results and for refills  HTN (hypertension) - Plan: hydrochlorothiazide (HYDRODIURIL) 25 MG tablet  Recheck in 6 months.  Betty Lennert PA-C 11/08/2012 3:54 PM

## 2012-11-09 LAB — HEPATIC FUNCTION PANEL
Albumin: 4.1 g/dL (ref 3.5–5.2)
Total Bilirubin: 0.4 mg/dL (ref 0.3–1.2)
Total Protein: 6.6 g/dL (ref 6.0–8.3)

## 2012-11-09 NOTE — Progress Notes (Signed)
Left msg for pt to schedule 6 month f-up with Maralyn Sago.

## 2012-11-10 LAB — HEPATITIS B SURFACE ANTIGEN: Hepatitis B Surface Ag: NEGATIVE

## 2012-11-17 ENCOUNTER — Telehealth: Payer: Self-pay

## 2012-11-17 DIAGNOSIS — R945 Abnormal results of liver function studies: Secondary | ICD-10-CM

## 2012-11-17 NOTE — Progress Notes (Signed)
error 

## 2012-11-17 NOTE — Telephone Encounter (Signed)
Patient is okay with doing ultrasound, needs it after August.

## 2012-11-20 NOTE — Progress Notes (Signed)
Sent pt reminder letter to schedule 6 month recheck with Sarah.

## 2012-11-23 ENCOUNTER — Ambulatory Visit
Admission: RE | Admit: 2012-11-23 | Discharge: 2012-11-23 | Disposition: A | Discharge status: Home / Self Care | Payer: BC Managed Care – PPO | Source: Ambulatory Visit | Attending: Physician Assistant | Admitting: Physician Assistant

## 2012-11-23 ENCOUNTER — Encounter: Payer: Self-pay | Admitting: Physician Assistant

## 2012-11-23 DIAGNOSIS — R748 Abnormal levels of other serum enzymes: Secondary | ICD-10-CM | POA: Insufficient documentation

## 2012-11-23 DIAGNOSIS — R945 Abnormal results of liver function studies: Secondary | ICD-10-CM

## 2012-11-27 ENCOUNTER — Other Ambulatory Visit: Payer: Self-pay

## 2012-11-27 MED ORDER — PROPRANOLOL HCL 80 MG PO TABS: 80.0000 mg | ORAL_TABLET | Freq: Every day | ORAL | Status: DC

## 2012-11-28 ENCOUNTER — Other Ambulatory Visit: Payer: Self-pay

## 2012-11-28 DIAGNOSIS — F411 Generalized anxiety disorder: Secondary | ICD-10-CM

## 2012-11-28 MED ORDER — CITALOPRAM HYDROBROMIDE 20 MG PO TABS: 40.0000 mg | ORAL_TABLET | Freq: Every day | ORAL | Status: DC

## 2012-12-01 ENCOUNTER — Telehealth: Payer: Self-pay

## 2012-12-01 NOTE — Telephone Encounter (Signed)
ERROR

## 2012-12-26 ENCOUNTER — Telehealth: Payer: Self-pay

## 2012-12-26 DIAGNOSIS — G4700 Insomnia, unspecified: Secondary | ICD-10-CM

## 2012-12-26 NOTE — Telephone Encounter (Signed)
Pt walked in to 104 to request refills on medications: Trazodone and nausea rx. Out of nausea rx.  States taking 2 of the trazadone every night. This is much better than previous rx per patient. States she has 1 month left on this medication. Going to a nutritionist next week because of nausea after eating.   Sees s.weber and dr guest  Bf  Pharmacy: [facility]

## 2012-12-26 NOTE — Telephone Encounter (Signed)
Trazodone pended, what is she taking for the nausea? She states she does not know what it was, she will call back to advise. She does have appt with integrative therapy next week.

## 2012-12-28 MED ORDER — TRAZODONE HCL 100 MG PO TABS: 100.0000 mg | ORAL_TABLET | Freq: Every day | ORAL | Status: DC

## 2012-12-28 NOTE — Telephone Encounter (Signed)
Rx for trazodone sent to pharmacy. Please advise that the new Rx will be for 100mg  so she only has to take 1 pill at bedtime.

## 2012-12-29 NOTE — Telephone Encounter (Signed)
Called pt, VM not set up

## 2012-12-30 NOTE — Telephone Encounter (Signed)
Pt notified that trazodone was sent in and nausea med was Zofran it was given to her back in Feb.  She had her gallbladder removed and she is trying to figure out what foods she can eat and she is having bouts of nausea

## 2013-01-03 MED ORDER — ONDANSETRON HCL 4 MG PO TABS: 4.0000 mg | ORAL_TABLET | Freq: Three times a day (TID) | ORAL | Status: DC | PRN

## 2013-01-03 NOTE — Telephone Encounter (Signed)
Pt notified that rx for zofran sent in.

## 2013-01-03 NOTE — Telephone Encounter (Signed)
I sent in the zofran for the patient.

## 2013-01-26 ENCOUNTER — Other Ambulatory Visit: Payer: Self-pay | Admitting: *Deleted

## 2013-01-26 MED ORDER — PROPRANOLOL HCL 80 MG PO TABS: 80.0000 mg | ORAL_TABLET | Freq: Every day | ORAL | Status: DC

## 2013-02-06 ENCOUNTER — Ambulatory Visit (INDEPENDENT_AMBULATORY_CARE_PROVIDER_SITE_OTHER): Payer: BC Managed Care – PPO | Admitting: Physician Assistant

## 2013-02-06 VITALS — BP 118/70 | HR 68 | Temp 98.6°F | Resp 18 | Ht 62.5 in | Wt 191.4 lb

## 2013-02-06 DIAGNOSIS — M79651 Pain in right thigh: Secondary | ICD-10-CM

## 2013-02-06 DIAGNOSIS — M79609 Pain in unspecified limb: Secondary | ICD-10-CM

## 2013-02-06 DIAGNOSIS — G4700 Insomnia, unspecified: Secondary | ICD-10-CM

## 2013-02-06 DIAGNOSIS — E785 Hyperlipidemia, unspecified: Secondary | ICD-10-CM

## 2013-02-06 DIAGNOSIS — M545 Low back pain, unspecified: Secondary | ICD-10-CM

## 2013-02-06 DIAGNOSIS — R404 Transient alteration of awareness: Secondary | ICD-10-CM

## 2013-02-06 DIAGNOSIS — Z23 Encounter for immunization: Secondary | ICD-10-CM

## 2013-02-06 DIAGNOSIS — IMO0001 Reserved for inherently not codable concepts without codable children: Secondary | ICD-10-CM

## 2013-02-06 LAB — COMPREHENSIVE METABOLIC PANEL
ALT: 61 U/L — ABNORMAL HIGH (ref 0–35)
AST: 45 U/L — ABNORMAL HIGH (ref 0–37)
Albumin: 3.9 g/dL (ref 3.5–5.2)
Alkaline Phosphatase: 81 U/L (ref 39–117)
BUN: 11 mg/dL (ref 6–23)
Calcium: 9.3 mg/dL (ref 8.4–10.5)
Chloride: 101 mEq/L (ref 96–112)
Potassium: 3.5 mEq/L (ref 3.5–5.3)
Sodium: 138 mEq/L (ref 135–145)

## 2013-02-06 LAB — POCT CBC
Granulocyte percent: 71.2 %G (ref 37–80)
HCT, POC: 39.9 % (ref 37.7–47.9)
Hemoglobin: 12.5 g/dL (ref 12.2–16.2)
Lymph, poc: 1.6 (ref 0.6–3.4)
MCHC: 31.3 g/dL — AB (ref 31.8–35.4)
MPV: 8.8 fL (ref 0–99.8)
POC Granulocyte: 5.3 (ref 2–6.9)
POC LYMPH PERCENT: 21.6 %L (ref 10–50)
POC MID %: 7.2 %M (ref 0–12)
RDW, POC: 16.3 %

## 2013-02-06 LAB — POCT SEDIMENTATION RATE: POCT SED RATE: 15 mm/hr (ref 0–22)

## 2013-02-06 LAB — C-REACTIVE PROTEIN: CRP: <0.5 mg/dL (ref <0.60)

## 2013-02-06 LAB — CK: Total CK: 51 U/L (ref 7–177)

## 2013-02-06 LAB — LIPID PANEL
Cholesterol: 257 mg/dL — ABNORMAL HIGH (ref 0–200)
Total CHOL/HDL Ratio: 4.5 Ratio

## 2013-02-06 MED ORDER — TRAZODONE HCL 100 MG PO TABS: 200.0000 mg | ORAL_TABLET | Freq: Every day | ORAL | Status: DC

## 2013-02-06 MED ORDER — METHOCARBAMOL 500 MG PO TABS: 500.0000 mg | ORAL_TABLET | Freq: Four times a day (QID) | ORAL | Status: DC

## 2013-02-06 NOTE — Progress Notes (Signed)
[address], [place] [address]   Phone [phone]  Subjective:    Patient ID: [name], female    DOB: [birth year], 57 y.o.   MRN: [id number]  HPI Pt presents to clinic with 3 concerns 1-she would like a flu vaccine 2-since June she has had a burning pain at her right outer thigh - only occurs at night but it is waking her up - she is having some low back pain but she is not sure if her back pain has gotten worse over the time frame - she does know that since her pain started in June the pain has gotten worse in intensity and it is lasting longer into the day - typically it goes away by about 10am - she was put on a dose of steroids several weeks ago and the pain significantly got better - she currently is off her Anaprox due to upcoming surgery in 4 days and she has noticed that the pain is worse since going off that med 3-also since June she has been waking up really stiff and with sore muscles - she is not having joint pain the pain feels like she has been working out to hard and she is sore - this pain is in her arms and legs and back - it also is worse at night and improves by mid morning - the prednisone dose also helped this pain -it is getting worse and causing her not to be able to work-out and lose weight --nothing changed in June that she can remember that might have caused the pain she is having -- her sleep has decreased since the pain has started - but her trazodone does help her get to sleep 4- she went to the nutritionist but did not feel like it helped her with her diet - she has not lost any weight and is frustrated because she is unable to work out because of her pain - she is starting to look into bariatric surgery options  Review of Systems  Constitutional: Negative for fever and chills.  Musculoskeletal: Positive for myalgias and back pain. Negative for joint swelling, arthralgias and gait problem.  Neurological: Positive for weakness (she thinks 2nd to the pain).    Psychiatric/Behavioral: Positive for sleep disturbance (worse with her recent increase in pain).       Objective:   Physical Exam  Vitals reviewed. Constitutional: She is oriented to person, place, and time. She appears well-developed and well-nourished.  HENT:  Head: Normocephalic and atraumatic.  Right Ear: External ear normal.  Left Ear: External ear normal.  Eyes: Conjunctivae are normal.  Neck: Normal range of motion.  Cardiovascular: Normal rate, regular rhythm and normal heart sounds.   No murmur heard. Pulmonary/Chest: Effort normal and breath sounds normal.  Musculoskeletal:       Lumbar back: She exhibits normal range of motion, no tenderness, no bony tenderness and no pain.       Back:  Neurological: She is alert and oriented to person, place, and time. She has normal reflexes. No sensory deficit.  Pt has 4/5 strength in her upper extremities she states it hurts to hold her arms up - she has good strength in her LE - no difference in her hip strength  Skin: Skin is warm and dry.  Psychiatric: She has a normal mood and affect. Her behavior is normal. Judgment and thought content normal.       Assessment & Plan:  Myalgia and myositis - I am  unsure what is causing her pain - we will do labs to look for inflammation and muscle break-down - if these are all normal she may be having pain resulting from decreased sleep due to lumbar pain and radiating nerve irritation or possible fibromyalgia esp since it is getting worse but with the fact that it improves with prednisone makes me think it is from inflammation.  Plan: CK, POCT SEDIMENTATION RATE, Comprehensive metabolic panel, Vit D  25 hydroxy (rtn osteoporosis monitoring), POCT CBC, C-reactive protein  Insomnia - increase her Rx dose - Plan: traZODone (DESYREL) 100 MG tablet  Flu vaccine need - Plan: Flu Vaccine QUAD 36+ mos IM  Low back pain - Will start muscle relaxer to see if we can reduce her lumbar pain and decrease  the nerve irritation into her R thigh - this is in the L4 distriibution and we may consider PT to help with the pain once her surgery is over -- Plan: methocarbamol (ROBAXIN) 500 MG tablet  Right thigh pain - I think related to her lumbar pain  Other and unspecified hyperlipidemia - recheck labs - Plan: Lipid panel  [name] PA-C 02/06/2013 9:10 AM

## 2013-02-10 ENCOUNTER — Telehealth: Payer: Self-pay | Admitting: Family Medicine

## 2013-02-10 NOTE — Telephone Encounter (Signed)
Spoke with patient gave her results and patient will just wait a while on referrall

## 2013-03-14 ENCOUNTER — Ambulatory Visit (INDEPENDENT_AMBULATORY_CARE_PROVIDER_SITE_OTHER): Payer: BC Managed Care – PPO | Admitting: Surgery

## 2013-03-14 ENCOUNTER — Encounter (INDEPENDENT_AMBULATORY_CARE_PROVIDER_SITE_OTHER): Payer: Self-pay | Admitting: Surgery

## 2013-03-14 ENCOUNTER — Other Ambulatory Visit (INDEPENDENT_AMBULATORY_CARE_PROVIDER_SITE_OTHER): Payer: Self-pay

## 2013-03-14 DIAGNOSIS — Z6836 Body mass index (BMI) 36.0-36.9, adult: Secondary | ICD-10-CM

## 2013-03-14 DIAGNOSIS — I10 Essential (primary) hypertension: Secondary | ICD-10-CM

## 2013-03-14 DIAGNOSIS — Z1231 Encounter for screening mammogram for malignant neoplasm of breast: Secondary | ICD-10-CM

## 2013-03-14 DIAGNOSIS — E7800 Pure hypercholesterolemia, unspecified: Secondary | ICD-10-CM

## 2013-03-14 NOTE — Patient Instructions (Signed)
Laparoscopic Gastric Band Surgery Care After These instructions give you information on caring for yourself after your procedure. Your doctor may also give you more specific instructions. Call your doctor if you have any problems or questions after your procedure. HOME CARE   Take walks throughout the day. Do not sit for longer than 1 hour while awake for 4 to 6 weeks.  You may shower 2 days after surgery. Pat the surgery cuts (incisions) dry. Do not rub the surgery cuts.  Do your coughing and deep breathing exercises.  Do not lift, push, or pull anything heavy until your doctor says it is okay.  Only take medicines as told by your doctor. Do not drive while taking pain medicine.  Drink plenty of fluids to keep your pee (urine) clear or pale yellow.  Stay on a liquid diet as long as your doctor tells you to.  Do not drink caffeine for 1 month.  Change bandages (dressings) as told by your doctor.  Check your surgery cuts for redness, pufffiness (swelling), abnormal coloring, fluid, or bleeding.  Follow your doctor's advice about vitamin and protein needs after surgery. GET HELP RIGHT AWAY IF:  You feel sick to your stomach (nauseous) and throw up (vomit).  You have pain and discomfort with swallowing.  You develop shortness of breath or difficulty breathing.  You have pain, puffiness, or feel warmth on your lower body.  You have very bad calf pain or pain not relieved by medicine.  You have a temperature by mouth above 102 F (38.9 C).  Your surgery cuts look red, puffy, or they leak fluid.  Your poop (stool) is black, tarry, or dark red.  You have chills.  You have chest pain.  You feel confused.  You have slurred speech.  You feel lightheaded when standing.  You suddenly feel weak.  You have any questions or concerns. MAKE SURE YOU:   Understand these instructions.  Will watch your condition.  Will get help right away if you are not doing well or get  worse. Document Released: 05/22/2010 Document Revised: 08/14/2012 Document Reviewed: 05/22/2010 Exit[employer] Patient Information 2014 [employer].  

## 2013-03-14 NOTE — Progress Notes (Signed)
Chief Complaint:  Morbid obesity with hypertension, hypercholesterolemia, arthritis, and borderline diabetes.  BMI 36  History of Present Illness:  [name] is an 57 y.o. female who is interested in having a laparoscopic adjustable gastric band. She has researched all the options and she thinks that she can work best with that to control her weight. She is concerned because she is borderline diabetic. She is followed by Dr. Robert Bellow.  She has tried diet pills but they give her headaches. She tried numerous other interventions with limited success.  Past Medical History  Diagnosis Date  . Migraine   . Hypertension   . PONV (postoperative nausea and vomiting)     violent nausea/vomiting    Past Surgical History  Procedure Laterality Date  . Back surgery    . Abdominal hysterectomy  2003  . Cholecystectomy N/A 06/27/2012    Procedure: LAPAROSCOPIC CHOLECYSTECTOMY WITH INTRAOPERATIVE CHOLANGIOGRAM;  Surgeon: Currie Paris, MD;  Location: [facility];  Service: General;  Laterality: N/A;  . Cesarean section      Current Outpatient Prescriptions  Medication Sig Dispense Refill  . ALPRAZolam (XANAX) 0.5 MG tablet Take 1 tablet (0.5 mg total) by mouth at bedtime as needed for sleep.  30 tablet  3  . aspirin 81 MG tablet Take 81 mg by mouth daily.      . citalopram (CELEXA) 20 MG tablet Take 2 tablets (40 mg total) by mouth daily.  180 tablet  1  . hydrochlorothiazide (HYDRODIURIL) 25 MG tablet TAKE ONE TABLET BY MOUTH ONCE DAILY  90 tablet  0  . loratadine (CLARITIN) 10 MG tablet Take 1 tablet (10 mg total) by mouth daily.  90 tablet  3  . methocarbamol (ROBAXIN) 500 MG tablet Take 1 tablet (500 mg total) by mouth 4 (four) times daily.  120 tablet  0  . Naproxen Sodium (ALEVE) 220 MG CAPS Take 1 capsule by mouth daily.      . naproxen sodium (ANAPROX DS) 550 MG tablet Take 1 tablet (550 mg total) by mouth 2 (two) times daily with a meal.  180 tablet  3  . ondansetron (ZOFRAN) 4 MG tablet  Take 1 tablet (4 mg total) by mouth every 8 (eight) hours as needed for nausea.  20 tablet  0  . propranolol (INDERAL) 80 MG tablet Take 1 tablet (80 mg total) by mouth daily.  90 tablet  1  . traMADol (ULTRAM) 50 MG tablet Take 50 mg by mouth every 6 (six) hours as needed for pain.      . traZODone (DESYREL) 100 MG tablet Take 2 tablets (200 mg total) by mouth at bedtime.  60 tablet  5   No current facility-administered medications for this visit.   Hydrocodone Family History  Problem Relation Age of Onset  . Leukemia Father   . Cancer Father   . Diabetes Maternal Grandmother   . Heart disease Paternal Grandmother   . Colon cancer Paternal Aunt 80   Social History:   reports that she quit smoking about 14 years ago. Her smoking use included Cigarettes. She smoked 0.00 packs per day. She has never used smokeless tobacco. She reports that she does not drink alcohol or use illicit drugs.   REVIEW OF SYSTEMS - PERTINENT POSITIVES ONLY: No history of DVT  Physical Exam:   Blood pressure 110/74, pulse 72, resp. rate 18, height 5\' 2"  (1.575 m), weight 197 lb 3.2 oz (89.449 kg). Body mass index is 36.06 kg/(m^2).  Gen:  WDWN white female NAD  Neurological: Alert and oriented to person, place, and time. Motor and sensory function is grossly intact  Head: Normocephalic and atraumatic.  Eyes: Conjunctivae are normal. Pupils are equal, round, and reactive to light. No scleral icterus.  Neck: Normal range of motion. Neck supple. No tracheal deviation or thyromegaly present.  Cardiovascular:  SR without murmurs or gallops.  No carotid bruits Respiratory: Effort normal.  No respiratory distress. No chest wall tenderness. Breath sounds normal.  No wheezes, rales or rhonchi.  Abdomen:  Scar from her laparoscopic cholecystectomy GU: Musculoskeletal: Normal range of motion. Extremities are nontender. No cyanosis, edema or clubbing noted Lymphadenopathy: No cervical, preauricular, postauricular or  axillary adenopathy is present Skin: Skin is warm and dry. No rash noted. No diaphoresis. No erythema. No pallor. Pscyh: Normal mood and affect. Behavior is normal. Judgment and thought content normal.   LABORATORY RESULTS: No results found for this or any previous visit (from the past 48 hour(s)).  RADIOLOGY RESULTS: No results found.  Problem List: Patient Active Problem List   Diagnosis Date Noted  . Elevated liver enzymes 11/23/2012  . Generalized anxiety disorder 08/23/2012  . Insomnia 08/14/2012  . Pain in joint, lower leg 08/14/2012  . HTN (hypertension) 06/16/2012  . Migraine, unspecified, without mention of intractable migraine without mention of status migrainosus 06/16/2012    Assessment & Plan: Morbid obesity for laparoscopic adjustable gastric banding.  Patient has researched this and is not interested in more invasive procedures.      Matt B. Daphine Deutscher, MD, [facility] [phone] beeper [phone]  03/14/2013 4:23 PM

## 2013-04-01 ENCOUNTER — Ambulatory Visit: Payer: BC Managed Care – PPO

## 2013-04-01 ENCOUNTER — Ambulatory Visit (INDEPENDENT_AMBULATORY_CARE_PROVIDER_SITE_OTHER): Payer: BC Managed Care – PPO | Admitting: Emergency Medicine

## 2013-04-01 VITALS — BP 110/72 | HR 63 | Temp 98.3°F | Resp 18 | Ht 62.5 in | Wt 198.0 lb

## 2013-04-01 DIAGNOSIS — M545 Low back pain, unspecified: Secondary | ICD-10-CM

## 2013-04-01 DIAGNOSIS — B029 Zoster without complications: Secondary | ICD-10-CM

## 2013-04-01 DIAGNOSIS — M25511 Pain in right shoulder: Secondary | ICD-10-CM

## 2013-04-01 DIAGNOSIS — M25519 Pain in unspecified shoulder: Secondary | ICD-10-CM

## 2013-04-01 MED ORDER — CYCLOBENZAPRINE HCL 5 MG PO TABS: 5.0000 mg | ORAL_TABLET | Freq: Three times a day (TID) | ORAL | Status: DC | PRN

## 2013-04-01 MED ORDER — TRAMADOL HCL 50 MG PO TABS: 50.0000 mg | ORAL_TABLET | Freq: Four times a day (QID) | ORAL | Status: DC | PRN

## 2013-04-01 MED ORDER — PREDNISONE 10 MG PO TABS: ORAL_TABLET | ORAL | Status: AC

## 2013-04-01 MED ORDER — VALACYCLOVIR HCL 1 G PO TABS: 1.0000 g | ORAL_TABLET | Freq: Three times a day (TID) | ORAL | Status: DC

## 2013-04-01 NOTE — Patient Instructions (Signed)
- Leg pain - I think related to nerve irritation and we will use flexeril and heat to the back and prednisone for now -- if it helps will send to formal PT - Shoulder pain -- ice to the area, at home PT Impingement Syndrome, Rotator Cuff, Bursitis with Rehab Impingement syndrome is a condition that involves inflammation of the tendons of the rotator cuff and the subacromial bursa, that causes pain in the shoulder. The rotator cuff consists of four tendons and muscles that control much of the shoulder and upper arm function. The subacromial bursa is a fluid filled sac that helps reduce friction between the rotator cuff and one of the bones of the shoulder (acromion). Impingement syndrome is usually an overuse injury that causes swelling of the bursa (bursitis), swelling of the tendon (tendonitis), and/or a tear of the tendon (strain). Strains are classified into three categories. Grade 1 strains cause pain, but the tendon is not lengthened. Grade 2 strains include a lengthened ligament, due to the ligament being stretched or partially ruptured. With grade 2 strains there is still function, although the function may be decreased. Grade 3 strains include a complete tear of the tendon or muscle, and function is usually impaired. SYMPTOMS   Pain around the shoulder, often at the outer portion of the upper arm.  Pain that gets worse with shoulder function, especially when reaching overhead or lifting.  Sometimes, aching when not using the arm.  Pain that wakes you up at night.  Sometimes, tenderness, swelling, warmth, or redness over the affected area.  Loss of strength.  Limited motion of the shoulder, especially reaching behind the back (to the back pocket or to unhook bra) or across your body.  Crackling sound (crepitation) when moving the arm.  Biceps tendon pain and inflammation (in the front of the shoulder). Worse when bending the elbow or lifting. CAUSES  Impingement syndrome is often an  overuse injury, in which chronic (repetitive) motions cause the tendons or bursa to become inflamed. A strain occurs when a force is paced on the tendon or muscle that is greater than it can withstand. Common mechanisms of injury include: Stress from sudden increase in duration, frequency, or intensity of training.  Direct hit (trauma) to the shoulder.  Aging, erosion of the tendon with normal use.  Bony bump on shoulder (acromial spur). RISK INCREASES WITH:  Contact sports (football, wrestling, boxing).  Throwing sports (baseball, tennis, volleyball).  Weightlifting and bodybuilding.  Heavy labor.  Previous injury to the rotator cuff, including impingement.  Poor shoulder strength and flexibility.  Failure to warm up properly before activity.  Inadequate protective equipment.  Old age.  Bony bump on shoulder (acromial spur). PREVENTION   Warm up and stretch properly before activity.  Allow for adequate recovery between workouts.  Maintain physical fitness:  Strength, flexibility, and endurance.  Cardiovascular fitness.  Learn and use proper exercise technique. PROGNOSIS  If treated properly, impingement syndrome usually goes away within 6 weeks. Sometimes surgery is required.  RELATED COMPLICATIONS   Longer healing time if not properly treated, or if not given enough time to heal.  Recurring symptoms, that result in a chronic condition.  Shoulder stiffness, frozen shoulder, or loss of motion.  Rotator cuff tendon tear.  Recurring symptoms, especially if activity is resumed too soon, with overuse, with a direct blow, or when using poor technique. TREATMENT  Treatment first involves the use of ice and medicine, to reduce pain and inflammation. The use of strengthening and  stretching exercises may help reduce pain with activity. These exercises may be performed at home or with a therapist. If non-surgical treatment is unsuccessful after more than 6 months,  surgery may be advised. After surgery and rehabilitation, activity is usually possible in 3 months.  MEDICATION  If pain medicine is needed, nonsteroidal anti-inflammatory medicines (aspirin and ibuprofen), or other minor pain relievers (acetaminophen), are often advised.  Do not take pain medicine for 7 days before surgery.  Prescription pain relievers may be given, if your caregiver thinks they are needed. Use only as directed and only as much as you need.  Corticosteroid injections may be given by your caregiver. These injections should be reserved for the most serious cases, because they may only be given a certain number of times. HEAT AND COLD  Cold treatment (icing) should be applied for 10 to 15 minutes every 2 to 3 hours for inflammation and pain, and immediately after activity that aggravates your symptoms. Use ice packs or an ice massage.  Heat treatment may be used before performing stretching and strengthening activities prescribed by your caregiver, physical therapist, or athletic trainer. Use a heat pack or a warm water soak. SEEK MEDICAL CARE IF:   Symptoms get worse or do not improve in 4 to 6 weeks, despite treatment.  New, unexplained symptoms develop. (Drugs used in treatment may produce side effects.) EXERCISES  RANGE OF MOTION (ROM) AND STRETCHING EXERCISES - Impingement Syndrome (Rotator Cuff  Tendinitis, Bursitis) These exercises may help you when beginning to rehabilitate your injury. Your symptoms may go away with or without further involvement from your physician, physical therapist or athletic trainer. While completing these exercises, remember:   Restoring tissue flexibility helps normal motion to return to the joints. This allows healthier, less painful movement and activity.  An effective stretch should be held for at least 30 seconds.  A stretch should never be painful. You should only feel a gentle lengthening or release in the stretched tissue. STRETCH   Flexion, Standing  Stand with good posture. With an underhand grip on your right / left hand, and an overhand grip on the opposite hand, grasp a broomstick or cane so that your hands are a little more than shoulder width apart.  Keeping your right / left elbow straight and shoulder muscles relaxed, push the stick with your opposite hand, to raise your right / left arm in front of your body and then overhead. Raise your arm until you feel a stretch in your right / left shoulder, but before you have increased shoulder pain.  Try to avoid shrugging your right / left shoulder as your arm rises, by keeping your shoulder blade tucked down and toward your mid-back spine. Hold for __________ seconds.  Slowly return to the starting position. Repeat __________ times. Complete this exercise __________ times per day. STRETCH  Abduction, Supine  Lie on your back. With an underhand grip on your right / left hand and an overhand grip on the opposite hand, grasp a broomstick or cane so that your hands are a little more than shoulder width apart.  Keeping your right / left elbow straight and your shoulder muscles relaxed, push the stick with your opposite hand, to raise your right / left arm out to the side of your body and then overhead. Raise your arm until you feel a stretch in your right / left shoulder, but before you have increased shoulder pain.  Try to avoid shrugging your right / left shoulder as  your arm rises, by keeping your shoulder blade tucked down and toward your mid-back spine. Hold for __________ seconds.  Slowly return to the starting position. Repeat __________ times. Complete this exercise __________ times per day. ROM  Flexion, Active-Assisted  Lie on your back. You may bend your knees for comfort.  Grasp a broomstick or cane so your hands are about shoulder width apart. Your right / left hand should grip the end of the stick, so that your hand is positioned "thumbs-up," as if you were  about to shake hands.  Using your healthy arm to lead, raise your right / left arm overhead, until you feel a gentle stretch in your shoulder. Hold for __________ seconds.  Use the stick to assist in returning your right / left arm to its starting position. Repeat __________ times. Complete this exercise __________ times per day.  ROM - Internal Rotation, Supine   Lie on your back on a firm surface. Place your right / left elbow about 60 degrees away from your side. Elevate your elbow with a folded towel, so that the elbow and shoulder are the same height.  Using a broomstick or cane and your strong arm, pull your right / left hand toward your body until you feel a gentle stretch, but no increase in your shoulder pain. Keep your shoulder and elbow in place throughout the exercise.  Hold for __________ seconds. Slowly return to the starting position. Repeat __________ times. Complete this exercise __________ times per day. STRETCH - Internal Rotation  Place your right / left hand behind your back, palm up.  Throw a towel or belt over your opposite shoulder. Grasp the towel with your right / left hand.  While keeping an upright posture, gently pull up on the towel, until you feel a stretch in the front of your right / left shoulder.  Avoid shrugging your right / left shoulder as your arm rises, by keeping your shoulder blade tucked down and toward your mid-back spine.  Hold for __________ seconds. Release the stretch, by lowering your healthy hand. Repeat __________ times. Complete this exercise __________ times per day. ROM - Internal Rotation   Using an underhand grip, grasp a stick behind your back with both hands.  While standing upright with good posture, slide the stick up your back until you feel a mild stretch in the front of your shoulder.  Hold for __________ seconds. Slowly return to your starting position. Repeat __________ times. Complete this exercise __________ times per  day.  STRETCH  Posterior Shoulder Capsule   Stand or sit with good posture. Grasp your right / left elbow and draw it across your chest, keeping it at the same height as your shoulder.  Pull your elbow, so your upper arm comes in closer to your chest. Pull until you feel a gentle stretch in the back of your shoulder.  Hold for __________ seconds. Repeat __________ times. Complete this exercise __________ times per day. STRENGTHENING EXERCISES - Impingement Syndrome (Rotator Cuff Tendinitis, Bursitis) These exercises may help you when beginning to rehabilitate your injury. They may resolve your symptoms with or without further involvement from your physician, physical therapist or athletic trainer. While completing these exercises, remember:  Muscles can gain both the endurance and the strength needed for everyday activities through controlled exercises.  Complete these exercises as instructed by your physician, physical therapist or athletic trainer. Increase the resistance and repetitions only as guided.  You may experience muscle soreness or fatigue, but the  pain or discomfort you are trying to eliminate should never worsen during these exercises. If this pain does get worse, stop and make sure you are following the directions exactly. If the pain is still present after adjustments, discontinue the exercise until you can discuss the trouble with your clinician.  During your recovery, avoid activity or exercises which involve actions that place your injured hand or elbow above your head or behind your back or head. These positions stress the tissues which you are trying to heal. STRENGTH - Scapular Depression and Adduction   With good posture, sit on a firm chair. Support your arms in front of you, with pillows, arm rests, or on a table top. Have your elbows in line with the sides of your body.  Gently draw your shoulder blades down and toward your mid-back spine. Gradually increase the  tension, without tensing the muscles along the top of your shoulders and the back of your neck.  Hold for __________ seconds. Slowly release the tension and relax your muscles completely before starting the next repetition.  After you have practiced this exercise, remove the arm support and complete the exercise in standing as well as sitting position. Repeat __________ times. Complete this exercise __________ times per day.  STRENGTH - Shoulder Abductors, Isometric  With good posture, stand or sit about 4-6 inches from a wall, with your right / left side facing the wall.  Bend your right / left elbow. Gently press your right / left elbow into the wall. Increase the pressure gradually, until you are pressing as hard as you can, without shrugging your shoulder or increasing any shoulder discomfort.  Hold for __________ seconds.  Release the tension slowly. Relax your shoulder muscles completely before you begin the next repetition. Repeat __________ times. Complete this exercise __________ times per day.  STRENGTH - External Rotators, Isometric  Keep your right / left elbow at your side and bend it 90 degrees.  Step into a door frame so that the outside of your right / left wrist can press against the door frame without your upper arm leaving your side.  Gently press your right / left wrist into the door frame, as if you were trying to swing the back of your hand away from your stomach. Gradually increase the tension, until you are pressing as hard as you can, without shrugging your shoulder or increasing any shoulder discomfort.  Hold for __________ seconds.  Release the tension slowly. Relax your shoulder muscles completely before you begin the next repetition. Repeat __________ times. Complete this exercise __________ times per day.  STRENGTH - Supraspinatus   Stand or sit with good posture. Grasp a __________ weight, or an exercise band or tubing, so that your hand is "thumbs-up,"  like you are shaking hands.  Slowly lift your right / left arm in a "V" away from your thigh, diagonally into the space between your side and straight ahead. Lift your hand to shoulder height or as far as you can, without increasing any shoulder pain. At first, many people do not lift their hands above shoulder height.  Avoid shrugging your right / left shoulder as your arm rises, by keeping your shoulder blade tucked down and toward your mid-back spine.  Hold for __________ seconds. Control the descent of your hand, as you slowly return to your starting position. Repeat __________ times. Complete this exercise __________ times per day.  STRENGTH - External Rotators  Secure a rubber exercise band or tubing to a  fixed object (table, pole) so that it is at the same height as your right / left elbow when you are standing or sitting on a firm surface.  Stand or sit so that the secured exercise band is at your uninjured side.  Bend your right / left elbow 90 degrees. Place a folded towel or small pillow under your right / left arm, so that your elbow is a few inches away from your side.  Keeping the tension on the exercise band, pull it away from your body, as if pivoting on your elbow. Be sure to keep your body steady, so that the movement is coming only from your rotating shoulder.  Hold for __________ seconds. Release the tension in a controlled manner, as you return to the starting position. Repeat __________ times. Complete this exercise __________ times per day.  STRENGTH - Internal Rotators   Secure a rubber exercise band or tubing to a fixed object (table, pole) so that it is at the same height as your right / left elbow when you are standing or sitting on a firm surface.  Stand or sit so that the secured exercise band is at your right / left side.  Bend your elbow 90 degrees. Place a folded towel or small pillow under your right / left arm so that your elbow is a few inches away from  your side.  Keeping the tension on the exercise band, pull it across your body, toward your stomach. Be sure to keep your body steady, so that the movement is coming only from your rotating shoulder.  Hold for __________ seconds. Release the tension in a controlled manner, as you return to the starting position. Repeat __________ times. Complete this exercise __________ times per day.  STRENGTH - Scapular Protractors, Standing   Stand arms length away from a wall. Place your hands on the wall, keeping your elbows straight.  Begin by dropping your shoulder blades down and toward your mid-back spine.  To strengthen your protractors, keep your shoulder blades down, but slide them forward on your rib cage. It will feel as if you are lifting the back of your rib cage away from the wall. This is a subtle motion and can be challenging to complete. Ask your caregiver for further instruction, if you are not sure you are doing the exercise correctly.  Hold for __________ seconds. Slowly return to the starting position, resting the muscles completely before starting the next repetition. Repeat __________ times. Complete this exercise __________ times per day. STRENGTH - Scapular Protractors, Supine  Lie on your back on a firm surface. Extend your right / left arm straight into the air while holding a __________ weight in your hand.  Keeping your head and back in place, lift your shoulder off the floor.  Hold for __________ seconds. Slowly return to the starting position, and allow your muscles to relax completely before starting the next repetition. Repeat __________ times. Complete this exercise __________ times per day. STRENGTH - Scapular Protractors, Quadruped  Get onto your hands and knees, with your shoulders directly over your hands (or as close as you can be, comfortably).  Keeping your elbows locked, lift the back of your rib cage up into your shoulder blades, so your mid-back rounds out.  Keep your neck muscles relaxed.  Hold this position for __________ seconds. Slowly return to the starting position and allow your muscles to relax completely before starting the next repetition. Repeat __________ times. Complete this exercise __________ times per  day.  STRENGTH - Scapular Retractors  Secure a rubber exercise band or tubing to a fixed object (table, pole), so that it is at the height of your shoulders when you are either standing, or sitting on a firm armless chair.  With a palm down grip, grasp an end of the band in each hand. Straighten your elbows and lift your hands straight in front of you, at shoulder height. Step back, away from the secured end of the band, until it becomes tense.  Squeezing your shoulder blades together, draw your elbows back toward your sides, as you bend them. Keep your upper arms lifted away from your body throughout the exercise.  Hold for __________ seconds. Slowly ease the tension on the band, as you reverse the directions and return to the starting position. Repeat __________ times. Complete this exercise __________ times per day. STRENGTH - Shoulder Extensors   Secure a rubber exercise band or tubing to a fixed object (table, pole) so that it is at the height of your shoulders when you are either standing, or sitting on a firm armless chair.  With a thumbs-up grip, grasp an end of the band in each hand. Straighten your elbows and lift your hands straight in front of you, at shoulder height. Step back, away from the secured end of the band, until it becomes tense.  Squeezing your shoulder blades together, pull your hands down to the sides of your thighs. Do not allow your hands to go behind you.  Hold for __________ seconds. Slowly ease the tension on the band, as you reverse the directions and return to the starting position. Repeat __________ times. Complete this exercise __________ times per day.  STRENGTH - Scapular Retractors and External  Rotators   Secure a rubber exercise band or tubing to a fixed object (table, pole) so that it is at the height as your shoulders, when you are either standing, or sitting on a firm armless chair.  With a palm down grip, grasp an end of the band in each hand. Bend your elbows 90 degrees and lift your elbows to shoulder height, at your sides. Step back, away from the secured end of the band, until it becomes tense.  Squeezing your shoulder blades together, rotate your shoulders so that your upper arms and elbows remain stationary, but your fists travel upward to head height.  Hold for __________ seconds. Slowly ease the tension on the band, as you reverse the directions and return to the starting position. Repeat __________ times. Complete this exercise __________ times per day.  STRENGTH - Scapular Retractors and External Rotators, Rowing   Secure a rubber exercise band or tubing to a fixed object (table, pole) so that it is at the height of your shoulders, when you are either standing, or sitting on a firm armless chair.  With a palm down grip, grasp an end of the band in each hand. Straighten your elbows and lift your hands straight in front of you, at shoulder height. Step back, away from the secured end of the band, until it becomes tense.  Step 1: Squeeze your shoulder blades together. Bending your elbows, draw your hands to your chest, as if you are rowing a boat. At the end of this motion, your hands and elbow should be at shoulder height and your elbows should be out to your sides.  Step 2: Rotate your shoulders, to raise your hands above your head. Your forearms should be vertical and your upper arms should  be horizontal.  Hold for __________ seconds. Slowly ease the tension on the band, as you reverse the directions and return to the starting position. Repeat __________ times. Complete this exercise __________ times per day.  STRENGTH  Scapular Depressors  Find a sturdy chair without  wheels, such as a dining room chair.  Keeping your feet on the floor, and your hands on the chair arms, lift your bottom up from the seat, and lock your elbows.  Keeping your elbows straight, allow gravity to pull your body weight down. Your shoulders will rise toward your ears.  Raise your body against gravity by drawing your shoulder blades down your back, shortening the distance between your shoulders and ears. Although your feet should always maintain contact with the floor, your feet should progressively support less body weight, as you get stronger.  Hold for __________ seconds. In a controlled and slow manner, lower your body weight to begin the next repetition. Repeat __________ times. Complete this exercise __________ times per day.  Document Released: 04/19/2005 Document Revised: 07/12/2011 Document Reviewed: 08/01/2008 [facility] Patient Information 2014 [place], [place].

## 2013-04-01 NOTE — Progress Notes (Signed)
Subjective:    Patient ID: [name], female    DOB: [birth year], 57 y.o.   MRN: [id number]  HPI  Pt presents to clinic with 2 concerns. 1- Right arm/shoulder pain for about the last week without a known injury.  It hurts when she lifts her arm to the side more so than when he lifts it front of her.  She has pain near and just below the shoulder joint.  It hurts to put on her bra.  She has no paresthesias in her right arm. She is R hand dominant. 2- she continues to have pain and tingling sensation into her right quad - he is still having back pain but it is not worse where the pain in her leg has gotten worse.  All the time and will sometimes wake her up at night.  The naproxen helps when she takes it.  The pain does not go past her knee.  It is a shooting pain but it does not go to her toe. 3- she also has a rash on her right buttocks for about 1 week that itches and hurts at the same time.  She has been approved for weight loss surgery and has an appt with me in 2 weeks for her CPE and medical clearance letter.  Review of Systems  Musculoskeletal: Positive for back pain. Negative for gait problem.       Objective:   Physical Exam  Vitals reviewed. Constitutional: She is oriented to person, place, and time. She appears well-developed and well-nourished.  HENT:  Head: Normocephalic and atraumatic.  Right Ear: External ear normal.  Left Ear: External ear normal.  Pulmonary/Chest: Effort normal.  Musculoskeletal:       Right shoulder: She exhibits tenderness and pain. She exhibits no spasm. Decreased range of motion: 2nd to pain.       Right hip: Normal.       Cervical back: Normal.       Lumbar back: Normal.       Arms:      Right upper leg: She exhibits no tenderness and no bony tenderness.  -TTP over anterior lateral shoulder joint.  Pain with internal rotation > external rotation.  + impingement sign internal>external, + empty can test.  Pain with strength testing but strength  is 4/5 2nd to pain with resistance.  Good sensation in fingers. -Good strength and sensation in her quads without increased pain with leg resistance testing.  Neg SLR.   Neurological: She is alert and oriented to person, place, and time.  Reflex Scores:      Patellar reflexes are 3+ on the right side and 3+ on the left side.      Achilles reflexes are 2+ on the right side and 2+ on the left side. Skin: Skin is warm. Rash (vesicular erythematous clustered rash on her right buttocks- it doe snot pass the mid-line) noted.     Psychiatric: She has a normal mood and affect. Her behavior is normal. Judgment and thought content normal.    [facility] reading (PRIMARY) by  Dr. Cleta Alberts.   Right shoulder - normal Lumbar - decrease joint space L5-S1 with ? Pars defect L5       Assessment & Plan:  Right shoulder pain - Her rotator cuff seems intake because she is able to have the ROM but she has pain with it, therefore we are mostly likely dealing with rotator cuff tendonitis - she will ice and do home PT and the prednisone  for her leg pain should help with this.  If she ends up doing formal PT we will also have them work with her shoulder as well.  If she gets no relief we will consider a steroid injection.  Plan: DG Shoulder Right, predniSONE (DELTASONE) 10 MG tablet  Lumbar pain with radiation down right leg -  This pain seems nerve irritation in origin - it has improved on prednisone in the past so we will do that again and see if she gets results.  We will do a PT referral for therapy due to normal neurological exam and continued pain.  Plan: DG Lumbar Spine Complete, predniSONE (DELTASONE) 10 MG tablet, traMADol (ULTRAM) 50 MG tablet, cyclobenzaprine (FLEXERIL) 5 MG tablet  Shingles - Will start medication.  Plan: valACYclovir (VALTREX) 1000 MG tablet  Pt has appt with me in 10 days and we will f/u with everything at that time and determine if PT is needed for her shoulder at that time.  [name] PA-C  04/01/2013 12:34 PM

## 2013-04-09 ENCOUNTER — Ambulatory Visit: Payer: BC Managed Care – PPO | Admitting: Physical Therapy

## 2013-04-10 ENCOUNTER — Ambulatory Visit (HOSPITAL_COMMUNITY)
Admission: RE | Admit: 2013-04-10 | Discharge: 2013-04-10 | Disposition: A | Discharge status: Home / Self Care | Payer: BC Managed Care – PPO | Source: Ambulatory Visit | Attending: Surgery | Admitting: Surgery

## 2013-04-10 DIAGNOSIS — Z6836 Body mass index (BMI) 36.0-36.9, adult: Secondary | ICD-10-CM | POA: Insufficient documentation

## 2013-04-10 DIAGNOSIS — M129 Arthropathy, unspecified: Secondary | ICD-10-CM | POA: Insufficient documentation

## 2013-04-10 DIAGNOSIS — K449 Diaphragmatic hernia without obstruction or gangrene: Secondary | ICD-10-CM | POA: Insufficient documentation

## 2013-04-10 DIAGNOSIS — E119 Type 2 diabetes mellitus without complications: Secondary | ICD-10-CM | POA: Insufficient documentation

## 2013-04-10 DIAGNOSIS — K219 Gastro-esophageal reflux disease without esophagitis: Secondary | ICD-10-CM | POA: Insufficient documentation

## 2013-04-10 DIAGNOSIS — I10 Essential (primary) hypertension: Secondary | ICD-10-CM | POA: Insufficient documentation

## 2013-04-10 DIAGNOSIS — E7800 Pure hypercholesterolemia, unspecified: Secondary | ICD-10-CM | POA: Insufficient documentation

## 2013-04-11 ENCOUNTER — Ambulatory Visit (INDEPENDENT_AMBULATORY_CARE_PROVIDER_SITE_OTHER): Payer: BC Managed Care – PPO | Admitting: Physician Assistant

## 2013-04-11 ENCOUNTER — Encounter: Payer: Self-pay | Admitting: Physician Assistant

## 2013-04-11 VITALS — BP 134/84 | HR 66 | Temp 98.2°F | Resp 16 | Ht 62.0 in | Wt 198.8 lb

## 2013-04-11 DIAGNOSIS — I10 Essential (primary) hypertension: Secondary | ICD-10-CM

## 2013-04-11 DIAGNOSIS — E7800 Pure hypercholesterolemia, unspecified: Secondary | ICD-10-CM

## 2013-04-11 DIAGNOSIS — IMO0001 Reserved for inherently not codable concepts without codable children: Secondary | ICD-10-CM

## 2013-04-11 NOTE — Progress Notes (Signed)
   Subjective:    Patient ID: [name], female    DOB: [birth year], 57 y.o.   MRN: [id number]  HPI Pt presents to clinic for f/u 1- her right arm pain is much better - still never figured out what was going on with it but it is getting better 2- her leg pain is still occuring - she decided to not do PT and wants to see if weight loss will help with her back pain - she does sit with both feet on the floor at work 3- she is in the process for getting weight loss surgery.  She has been accepted into the program but now is determining whether her insurance will cover it.  She needs a letter stating how weight loss will help her overall healt.  Letter for comorbities for - [place] HTN cholesterol Back/leg pain  Review of Systems     Objective:   Physical Exam  Vitals reviewed. Constitutional: She is oriented to person, place, and time. She appears well-developed and well-nourished.  HENT:  Head: Normocephalic and atraumatic.  Right Ear: External ear normal.  Left Ear: External ear normal.  Eyes: Conjunctivae are normal.  Pulmonary/Chest: Effort normal.  Neurological: She is alert and oriented to person, place, and time.  Skin: Skin is warm and dry.  Psychiatric: She has a normal mood and affect. Her behavior is normal. Judgment and thought content normal.       Assessment & Plan:  1- overweight 2- HTN - well controlled on medications - will continue - after she has her weight loss surgery we will closely monitor her HTN because with weight loss we would expect her need for medication to decrease 3- elevated cholesterol - pt will let me know when she has her surgery and we will monitor about 3 months after weight loss 4- back pain - continues - she would like to wait until after her aurgery to see if it improves and then determine whether PT is necessary.  [name] PA-C 04/11/2013 2:59 PM

## 2013-04-16 ENCOUNTER — Other Ambulatory Visit: Payer: Self-pay

## 2013-04-16 DIAGNOSIS — I10 Essential (primary) hypertension: Secondary | ICD-10-CM

## 2013-04-16 MED ORDER — HYDROCHLOROTHIAZIDE 25 MG PO TABS: ORAL_TABLET | ORAL | Status: DC

## 2013-04-19 ENCOUNTER — Other Ambulatory Visit: Payer: Self-pay | Admitting: Physician Assistant

## 2013-05-14 ENCOUNTER — Encounter: Payer: Self-pay | Admitting: Dietician

## 2013-05-14 ENCOUNTER — Encounter: Payer: BC Managed Care – PPO | Attending: Surgery | Admitting: Dietician

## 2013-05-14 VITALS — Ht 62.0 in | Wt 200.3 lb

## 2013-05-14 DIAGNOSIS — E669 Obesity, unspecified: Secondary | ICD-10-CM | POA: Insufficient documentation

## 2013-05-14 DIAGNOSIS — Z713 Dietary counseling and surveillance: Secondary | ICD-10-CM | POA: Insufficient documentation

## 2013-05-14 DIAGNOSIS — IMO0001 Reserved for inherently not codable concepts without codable children: Secondary | ICD-10-CM

## 2013-05-14 DIAGNOSIS — Z6835 Body mass index (BMI) 35.0-35.9, adult: Secondary | ICD-10-CM | POA: Insufficient documentation

## 2013-05-14 NOTE — Patient Instructions (Signed)
Work Pre-Op Goals Look for Protein shakes to try.  Call [facility] when your surgery date is scheduled to enroll in Pre-Op Class

## 2013-05-14 NOTE — Progress Notes (Signed)
  Pre-Op Assessment Visit:  Pre-Operative LAGB Surgery  Medical Nutrition Therapy:  Appt start time: 1100   End time:  1145.  Patient was seen on 05/14/2013 for Pre-Operative LAGB Nutrition Assessment. Assessment and letter of approval faxed to [facility] B[facility]ric [facility] Program coordinator on 05/14/2013.   Handouts given during visit include:  Pre-Op Goals Bariatric Surgery Protein Shakes  Patient to call the [facility] to enroll in Pre-Op and Post-Op Nutrition Education when surgery date is scheduled.

## 2013-06-13 ENCOUNTER — Other Ambulatory Visit: Payer: Self-pay

## 2013-06-13 DIAGNOSIS — F411 Generalized anxiety disorder: Secondary | ICD-10-CM

## 2013-06-13 MED ORDER — CITALOPRAM HYDROBROMIDE 20 MG PO TABS: 40.0000 mg | ORAL_TABLET | Freq: Every day | ORAL | Status: DC

## 2013-07-18 ENCOUNTER — Other Ambulatory Visit: Payer: Self-pay

## 2013-07-18 DIAGNOSIS — F411 Generalized anxiety disorder: Secondary | ICD-10-CM

## 2013-07-18 NOTE — Telephone Encounter (Signed)
Maralyn Sago, you Rxd this for pt at PE in 08/2012, but I don't see a discussion about anxiety at recent OV w/you. Do you want to give RFs?

## 2013-07-23 MED ORDER — CITALOPRAM HYDROBROMIDE 20 MG PO TABS: 40.0000 mg | ORAL_TABLET | Freq: Every day | ORAL | Status: DC

## 2013-08-09 ENCOUNTER — Telehealth: Payer: Self-pay | Admitting: Physician Assistant

## 2013-08-09 NOTE — Telephone Encounter (Signed)
Please let pt know that I got her letter.  I wrote that letter for Dr Daphine Deutscher - please read that to her and see if she would like me to print that out.  From the letter that she received I am not sure what other medical information that would change their mind.  I might suggest that she talk with Dr Ermalene Searing office to see if they have any suggestions.  FYI - she is trying to get weight loss surgery covered and that is what the letter is in response to her denial of coverage.

## 2013-08-10 NOTE — Telephone Encounter (Signed)
Weight is going up At 205 BMI 37 Ins says chol not high enough to warrant paying for surgery  Dr. Daphine Deutscher suggested [name] give him a call and to see if someone here could call [employer] He said PCP has more power with ins than Dr. Daphine Deutscher  She is going to come in--right leg pain--she will come in Monday morning to discuss pain and all of the above info

## 2013-08-13 ENCOUNTER — Ambulatory Visit (INDEPENDENT_AMBULATORY_CARE_PROVIDER_SITE_OTHER): Payer: BC Managed Care – PPO | Admitting: Physician Assistant

## 2013-08-13 VITALS — BP 130/80 | HR 60 | Temp 97.7°F | Resp 18 | Ht 61.5 in | Wt 205.2 lb

## 2013-08-13 DIAGNOSIS — M79643 Pain in unspecified hand: Secondary | ICD-10-CM

## 2013-08-13 DIAGNOSIS — Z8261 Family history of arthritis: Secondary | ICD-10-CM

## 2013-08-13 DIAGNOSIS — M79609 Pain in unspecified limb: Secondary | ICD-10-CM

## 2013-08-13 DIAGNOSIS — M79604 Pain in right leg: Secondary | ICD-10-CM

## 2013-08-13 DIAGNOSIS — M545 Low back pain, unspecified: Secondary | ICD-10-CM

## 2013-08-13 LAB — POCT SEDIMENTATION RATE: POCT SED RATE: 11 mm/h (ref 0–22)

## 2013-08-13 LAB — RHEUMATOID FACTOR: Rhuematoid fact SerPl-aCnc: <10 IU/mL (ref <=14)

## 2013-08-13 MED ORDER — CELECOXIB 200 MG PO CAPS: 200.0000 mg | ORAL_CAPSULE | ORAL | Status: DC

## 2013-08-13 MED ORDER — TRAMADOL HCL 50 MG PO TABS: 50.0000 mg | ORAL_TABLET | Freq: Every evening | ORAL | Status: DC | PRN

## 2013-08-13 NOTE — Patient Instructions (Signed)
We are going to check your possibility of rheumatoid arthritis. We are going to refer to rheumatology for evaluation of possible RA or a different diagnosis. I am going to be writing a letter on your behalf and talking with Dr Daphine Deutscher.

## 2013-08-13 NOTE — Progress Notes (Signed)
Subjective:    Patient ID: [name], female    DOB: [birth year], 57 y.o.   MRN: [id number]  HPI Pt presents to clinic with worsening hand pain over the last 6 months - she has had bilateral carpal tunnel release and since then she has developed pain in her hands.  It is worse in the right hand and worse in the am.  When she wakes up in the am she is stiff and has to rub her hands for a period of time before she can even take the covers off of her.  Her hands are swollen everyday - it seems worse in the am but never resolves.  She can no longer wear her rings or watches due to the swelling.  Her feet also feel swollen.  Her mother and many of the women on that side of the family has RA.  She has never been tested. Her right leg pain continues to hurt.  It is not worsening but she is getting frustrated and down with her constant pain.  It is an aching and burning pain.  The prednisone she was on at one point helped for about 3 weeks but she does not want to have to keep going on prednisone if she does not have to and she would like to determine the cause of the pain. She is getting depressed because she cannot do the things that she wants to do because her pain is limiting her.  If she works through the pain she then suffers the following day.  Her naproxen is no longer her pain.  She used to lead an active lifestyle but no longer can even garden due to her pain. Her sleep is disrupted due to her pain in her right leg.  It takes her a long time to go to sleep and when she rolls over she finds she wakes up from the pain.  When she sleeps she also has torso pain with rolling over. She is interested in weight loss surgery.  She has a healthy diet but due to her pain she is not able to exercise and her weight continues to rise.  She is worried about her BP and cholesterol and her increased risk of diabetes.  She knows the weight is not helping help pain but as her weight increases her pain also  increases.  Review of Systems     Objective:   Physical Exam  Vitals reviewed. Constitutional: She is oriented to person, place, and time. She appears well-developed and well-nourished.  HENT:  Head: Normocephalic and atraumatic.  Right Ear: External ear normal.  Left Ear: External ear normal.  Eyes: Conjunctivae are normal.  Neck: Normal range of motion.  Pulmonary/Chest: Effort normal.  Neurological: She is alert and oriented to person, place, and time. She has normal reflexes. A sensory deficit is present.  Decrease hand grip - pt states that it hurts to bad.  Her fingers appear swollen.  There is no erythema.  Skin: Skin is warm and dry.  Psychiatric: She has a normal mood and affect. Her behavior is normal. Judgment and thought content normal.  Seems frustrated.       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  Plan: celecoxib (CELEBREX) 200 MG capsule, traMADol (ULTRAM) 50 MG tablet  Leg pain, right - ongoing   I am concerned that the patient might have RA with her symptoms and her family history we will check labs today and send for a rheumatology consult.  If the patient does not have RA it is possible fibromyalgia but she is having such distinct hand pain that it is less likely.  We will try NSAIDs and ultram for nighttime pain until then.  We will wait on a referral for her leg until after rheumatology.  I will write a letter to her insurance company in support of the surgery.  I think that if her pain would improve she might be able to lead a more active lifestyle so depending on her  lab results I may encourage the patient to wait for the surgery until she sees rheumatology for a diagnosis of her hand pain, though her leg is what is really not allowing her to exercise.  [name] PA-C  [facility] 08/13/2013 10:03 AM

## 2013-08-14 LAB — ANA: Anti Nuclear Antibody(ANA): NEGATIVE

## 2013-08-21 NOTE — Telephone Encounter (Signed)
We have referred her to RA.  Do you want to write a different letter?

## 2013-08-21 NOTE — Telephone Encounter (Signed)
Letter is ready.

## 2013-08-21 NOTE — Telephone Encounter (Signed)
Patient is requesting a letter of medical necessity for weight loss clinic.   [phone]

## 2013-08-22 NOTE — Telephone Encounter (Signed)
Was going to call patient to advised letter ready for pick up.  Spoke with Apolonio Schneiders, it is not in the pick up drawer.  They will look for it and then we will call the patient.

## 2013-08-22 NOTE — Telephone Encounter (Signed)
Letter is in pick up drawer.

## 2013-08-22 NOTE — Telephone Encounter (Signed)
Called to notify pt it is ready for pick up.

## 2013-08-23 ENCOUNTER — Other Ambulatory Visit: Payer: Self-pay | Admitting: Physician Assistant

## 2013-09-12 ENCOUNTER — Encounter: Payer: Self-pay | Admitting: Physician Assistant

## 2013-09-12 DIAGNOSIS — M180 Bilateral primary osteoarthritis of first carpometacarpal joints: Secondary | ICD-10-CM | POA: Insufficient documentation

## 2013-09-15 ENCOUNTER — Other Ambulatory Visit: Payer: Self-pay | Admitting: Physician Assistant

## 2013-09-17 ENCOUNTER — Emergency Department (HOSPITAL_COMMUNITY): Payer: BC Managed Care – PPO

## 2013-09-17 ENCOUNTER — Observation Stay (HOSPITAL_COMMUNITY)
Admission: EM | Admit: 2013-09-17 | Discharge: 2013-09-18 | Disposition: A | Discharge status: Home / Self Care | Payer: BC Managed Care – PPO | Attending: Internal Medicine | Admitting: Internal Medicine

## 2013-09-17 ENCOUNTER — Ambulatory Visit (INDEPENDENT_AMBULATORY_CARE_PROVIDER_SITE_OTHER): Payer: BC Managed Care – PPO | Admitting: Family Medicine

## 2013-09-17 ENCOUNTER — Encounter (HOSPITAL_COMMUNITY): Payer: Self-pay | Admitting: Emergency Medicine

## 2013-09-17 VITALS — BP 120/80 | HR 65 | Temp 98.3°F | Resp 16 | Ht 62.0 in | Wt 203.0 lb

## 2013-09-17 DIAGNOSIS — E669 Obesity, unspecified: Secondary | ICD-10-CM | POA: Insufficient documentation

## 2013-09-17 DIAGNOSIS — E7800 Pure hypercholesterolemia, unspecified: Secondary | ICD-10-CM

## 2013-09-17 DIAGNOSIS — Z87891 Personal history of nicotine dependence: Secondary | ICD-10-CM | POA: Insufficient documentation

## 2013-09-17 DIAGNOSIS — R42 Dizziness and giddiness: Secondary | ICD-10-CM | POA: Diagnosis present

## 2013-09-17 DIAGNOSIS — Z79899 Other long term (current) drug therapy: Secondary | ICD-10-CM | POA: Insufficient documentation

## 2013-09-17 DIAGNOSIS — M064 Inflammatory polyarthropathy: Secondary | ICD-10-CM

## 2013-09-17 DIAGNOSIS — Z791 Long term (current) use of non-steroidal anti-inflammatories (NSAID): Secondary | ICD-10-CM | POA: Insufficient documentation

## 2013-09-17 DIAGNOSIS — IMO0002 Reserved for concepts with insufficient information to code with codable children: Secondary | ICD-10-CM | POA: Insufficient documentation

## 2013-09-17 DIAGNOSIS — Z7982 Long term (current) use of aspirin: Secondary | ICD-10-CM | POA: Insufficient documentation

## 2013-09-17 DIAGNOSIS — Z8249 Family history of ischemic heart disease and other diseases of the circulatory system: Secondary | ICD-10-CM

## 2013-09-17 DIAGNOSIS — I10 Essential (primary) hypertension: Secondary | ICD-10-CM

## 2013-09-17 DIAGNOSIS — M542 Cervicalgia: Secondary | ICD-10-CM | POA: Insufficient documentation

## 2013-09-17 DIAGNOSIS — R55 Syncope and collapse: Secondary | ICD-10-CM

## 2013-09-17 DIAGNOSIS — E876 Hypokalemia: Secondary | ICD-10-CM

## 2013-09-17 DIAGNOSIS — M129 Arthropathy, unspecified: Secondary | ICD-10-CM | POA: Insufficient documentation

## 2013-09-17 DIAGNOSIS — R079 Chest pain, unspecified: Principal | ICD-10-CM

## 2013-09-17 DIAGNOSIS — G43909 Migraine, unspecified, not intractable, without status migrainosus: Secondary | ICD-10-CM | POA: Diagnosis present

## 2013-09-17 DIAGNOSIS — R0602 Shortness of breath: Secondary | ICD-10-CM | POA: Insufficient documentation

## 2013-09-17 DIAGNOSIS — M1990 Unspecified osteoarthritis, unspecified site: Secondary | ICD-10-CM

## 2013-09-17 HISTORY — DX: Unspecified osteoarthritis, unspecified site: M19.90

## 2013-09-17 LAB — BASIC METABOLIC PANEL
BUN: 17 mg/dL (ref 6–23)
CHLORIDE: 101 meq/L (ref 96–112)
CO2: 29 mEq/L (ref 19–32)
CREATININE: 0.66 mg/dL (ref 0.50–1.10)
Calcium: 9 mg/dL (ref 8.4–10.5)
GFR calc Af Amer: >90 mL/min (ref >90)
GLUCOSE: 133 mg/dL — AB (ref 70–99)
POTASSIUM: 3.5 meq/L — AB (ref 3.7–5.3)
SODIUM: 141 meq/L (ref 137–147)

## 2013-09-17 LAB — TSH: TSH: 0.402 u[IU]/mL (ref 0.350–4.500)

## 2013-09-17 LAB — TROPONIN I

## 2013-09-17 LAB — CBC
HCT: 38.6 % (ref 36.0–46.0)
Hemoglobin: 13 g/dL (ref 12.0–15.0)
MCH: 29.8 pg (ref 26.0–34.0)
MCHC: 33.7 g/dL (ref 30.0–36.0)
MCV: 88.5 fL (ref 78.0–100.0)
PLATELETS: 318 10*3/uL (ref 150–400)
RBC: 4.36 MIL/uL (ref 3.87–5.11)
RDW: 13.7 % (ref 11.5–15.5)
WBC: 8.2 10*3/uL (ref 4.0–10.5)

## 2013-09-17 LAB — PRO B NATRIURETIC PEPTIDE: Pro B Natriuretic peptide (BNP): 73 pg/mL (ref 0–125)

## 2013-09-17 LAB — I-STAT TROPONIN, ED
Troponin i, poc: 0 ng/mL (ref 0.00–0.08)
Troponin i, poc: 0 ng/mL (ref 0.00–0.08)

## 2013-09-17 MED ORDER — ZOLPIDEM TARTRATE 5 MG PO TABS
5.0000 mg | ORAL_TABLET | Freq: Once | ORAL | Status: AC
  Administered 2013-09-18: 5 mg via ORAL
  Filled 2013-09-17: qty 1

## 2013-09-17 MED ORDER — ASPIRIN 300 MG RE SUPP: 300.0000 mg | RECTAL | Status: DC

## 2013-09-17 MED ORDER — HYDROCHLOROTHIAZIDE 25 MG PO TABS
25.0000 mg | ORAL_TABLET | Freq: Every day | ORAL | Status: DC
  Filled 2013-09-17: qty 1

## 2013-09-17 MED ORDER — PROPRANOLOL HCL 80 MG PO TABS
80.0000 mg | ORAL_TABLET | Freq: Every day | ORAL | Status: DC
  Administered 2013-09-18: 80 mg via ORAL
  Filled 2013-09-17: qty 1

## 2013-09-17 MED ORDER — TRAMADOL HCL 50 MG PO TABS
50.0000 mg | ORAL_TABLET | Freq: Every evening | ORAL | Status: DC | PRN
  Administered 2013-09-17: 100 mg via ORAL
  Filled 2013-09-17 (×2): qty 2

## 2013-09-17 MED ORDER — CITALOPRAM HYDROBROMIDE 20 MG PO TABS
20.0000 mg | ORAL_TABLET | Freq: Every day | ORAL | Status: DC
  Filled 2013-09-17: qty 1

## 2013-09-17 MED ORDER — ASPIRIN EC 81 MG PO TBEC
81.0000 mg | DELAYED_RELEASE_TABLET | Freq: Every day | ORAL | Status: DC
  Filled 2013-09-17: qty 1

## 2013-09-17 MED ORDER — LORATADINE 10 MG PO TABS
10.0000 mg | ORAL_TABLET | Freq: Every day | ORAL | Status: DC
  Filled 2013-09-17: qty 1

## 2013-09-17 MED ORDER — DIPHENHYDRAMINE HCL 50 MG/ML IJ SOLN
25.0000 mg | Freq: Once | INTRAMUSCULAR | Status: AC
  Administered 2013-09-17: 25 mg via INTRAVENOUS
  Filled 2013-09-17: qty 1

## 2013-09-17 MED ORDER — PREDNISONE 5 MG PO TABS
5.0000 mg | ORAL_TABLET | Freq: Every day | ORAL | Status: DC
  Filled 2013-09-17 (×2): qty 1

## 2013-09-17 MED ORDER — METOCLOPRAMIDE HCL 5 MG/ML IJ SOLN
10.0000 mg | Freq: Once | INTRAMUSCULAR | Status: AC
  Administered 2013-09-17: 10 mg via INTRAVENOUS
  Filled 2013-09-17: qty 2

## 2013-09-17 MED ORDER — ALPRAZOLAM 0.5 MG PO TABS: 0.5000 mg | ORAL_TABLET | Freq: Every evening | ORAL | Status: DC | PRN

## 2013-09-17 MED ORDER — HEPARIN SODIUM (PORCINE) 5000 UNIT/ML IJ SOLN
5000.0000 [IU] | Freq: Three times a day (TID) | INTRAMUSCULAR | Status: DC
  Administered 2013-09-17 – 2013-09-18 (×2): 5000 [IU] via SUBCUTANEOUS
  Filled 2013-09-17 (×5): qty 1

## 2013-09-17 MED ORDER — ATORVASTATIN CALCIUM 20 MG PO TABS
20.0000 mg | ORAL_TABLET | Freq: Every day | ORAL | Status: DC
  Administered 2013-09-17: 20 mg via ORAL
  Filled 2013-09-17 (×2): qty 1

## 2013-09-17 MED ORDER — ASPIRIN 81 MG PO CHEW: 324.0000 mg | CHEWABLE_TABLET | ORAL | Status: DC

## 2013-09-17 MED ORDER — ASPIRIN 81 MG PO TABS: 81.0000 mg | ORAL_TABLET | Freq: Every day | ORAL | Status: DC

## 2013-09-17 MED ORDER — SODIUM CHLORIDE 0.9 % IV SOLN
INTRAVENOUS | Status: DC
  Administered 2013-09-17: 21:00:00 via INTRAVENOUS

## 2013-09-17 MED ORDER — ASPIRIN 325 MG PO TABS: 325.0000 mg | ORAL_TABLET | Freq: Once | ORAL | Status: DC

## 2013-09-17 MED ORDER — DEXAMETHASONE SODIUM PHOSPHATE 10 MG/ML IJ SOLN
10.0000 mg | Freq: Once | INTRAMUSCULAR | Status: AC
  Administered 2013-09-17: 10 mg via INTRAVENOUS
  Filled 2013-09-17: qty 1

## 2013-09-17 NOTE — Patient Instructions (Addendum)
Assuming that your heart is ok and that you are discharged to home, lets plan to have your follow-up with rheumatology regarding your pains.  Also, it would probably be a good idea to have you undergo a cardiac stress test at some point in the near future; I am glad to arrange this if you need me to.

## 2013-09-17 NOTE — H&P (Signed)
Pt. Seen and examined. Agree with the NP/PA-C note as written.  58 yo female with sharp, left upper chest pain, neck pain and left arm pain. Possibly relieved with nitroglycerin, however, she developed headache, hypotension and was presyncopal. Troponin in the ER was negative x 2. EKG is negative for ischemia. She has a smoking history, strong family history of CAD and is being worked-up for possible rheumatoid arthritis. I would recommend overnight observation due to her continued nausea, low grade chest pain and hypotension. Plan a coronary CTA tomorrow AM. If negative, she could likely be discharged afterwards with outpatient follow-up.  Chrystie Nose, MD, [facility] Attending Cardiologist [facility]

## 2013-09-17 NOTE — ED Notes (Signed)
Cardiology NP at bedside.

## 2013-09-17 NOTE — Progress Notes (Signed)
[facility] [address], [place] [address] [phone]  Date:  09/17/2013   Name:  [name]   DOB:  May 01, 1956   MRN:  [id number]  PCP:  Tally Due, MD    Chief Complaint: Chest Pain   History of Present Illness:  [name] is a 58 y.o. very pleasant female patient who presents with the following:  She is here today with chest pain.  Last week she stared on prednisone for arthritis. She is being seen by rheumatology and is currently being worked up for diffuse body pains.   She now notes pain in her chest for the left couple of days, left arm and left neck today.  She also feels a "numb" feeling in her left arm.  She has noted nausea, had vomiting 2 days ago.    She did start prednisone recently, but no other new medications.   She has no history of heart problems- however her mother had CAD, 2 uncles with history of bypass.  Her brother also has some trouble with her heart  She has never had a stress test, does not have a cardiologist.   She did not take any aspirin yet today.   Patient Active Problem List   Diagnosis Date Noted  . Inflammatory arthritis 09/12/2013  . Elevated cholesterol 04/11/2013  . Obesity, Class II, BMI 35-39.9, with comorbidity 03/14/2013  . Elevated liver enzymes 11/23/2012  . Generalized anxiety disorder 08/23/2012  . Insomnia 08/14/2012  . Pain in joint, lower leg 08/14/2012  . HTN (hypertension) 06/16/2012  . Migraine, unspecified, without mention of intractable migraine without mention of status migrainosus 06/16/2012    Past Medical History  Diagnosis Date  . Migraine   . Hypertension   . PONV (postoperative nausea and vomiting)     violent nausea/vomiting  . Hyperlipidemia   . Obesity     Past Surgical History  Procedure Laterality Date  . Back surgery    . Abdominal hysterectomy  2003  . Cholecystectomy N/A 06/27/2012    Procedure: LAPAROSCOPIC CHOLECYSTECTOMY WITH INTRAOPERATIVE CHOLANGIOGRAM;   Surgeon: Currie Paris, MD;  Location: [facility];  Service: General;  Laterality: N/A;  . Cesarean section    . Carpal tunnel release      History  Substance Use Topics  . Smoking status: Former Smoker    Types: Cigarettes    Quit date: 05/03/1998  . Smokeless tobacco: Never Used  . Alcohol Use: No    Family History  Problem Relation Age of Onset  . Leukemia Father   . Cancer Father   . Diabetes Maternal Grandmother   . Heart disease Paternal Grandmother   . Colon cancer Paternal Aunt 38  . Rheum arthritis Mother     Allergies  Allergen Reactions  . Hydrocodone Itching    Medication list has been reviewed and updated.  Current Outpatient Prescriptions on File Prior to Visit  Medication Sig Dispense Refill  . ALPRAZolam (XANAX) 0.5 MG tablet Take 1 tablet (0.5 mg total) by mouth at bedtime as needed for sleep.  30 tablet  3  . aspirin 81 MG tablet Take 81 mg by mouth daily.      . celecoxib (CELEBREX) 200 MG capsule Take 1 capsule (200 mg total) by mouth every morning.  30 capsule  0  . citalopram (CELEXA) 20 MG tablet Take 2 tablets (40 mg total) by mouth daily.  60 tablet  2  . hydrochlorothiazide (HYDRODIURIL) 25 MG tablet  Take 1 tablet (25 mg total) by mouth daily. PATIENT NEEDS OFFICE VISIT FOR ADDITIONAL REFILLS  30 tablet  0  . loratadine (CLARITIN) 10 MG tablet Take 1 tablet (10 mg total) by mouth daily.  90 tablet  3  . Naproxen Sodium (ALEVE) 220 MG CAPS Take 1 capsule by mouth daily.      . naproxen sodium (ANAPROX DS) 550 MG tablet Take 1 tablet (550 mg total) by mouth 2 (two) times daily with a meal.  180 tablet  3  . ondansetron (ZOFRAN) 4 MG tablet TAKE 1 TABLET BY MOUTH EVERY 8 HOURS AS NEEDED FOR NAUSEA  20 tablet  0  . propranolol (INDERAL) 80 MG tablet Take 1 tablet (80 mg total) by mouth daily. PATIENT NEEDS OFFICE VISIT FOR ADDITIONAL REFILLS  30 tablet  0  . traMADol (ULTRAM) 50 MG tablet Take 1-2 tablets (50-100 mg total) by mouth at bedtime as  needed.  60 tablet  0  . traZODone (DESYREL) 100 MG tablet TAKE 1 TABLET BY MOUTH AT BEDTIME.  30 tablet  5   No current facility-administered medications on file prior to visit.    Review of Systems:  As per HPI- otherwise negative.   Physical Examination: Filed Vitals:   09/17/13 1332  BP: 120/80  Pulse: 65  Temp: 98.3 F (36.8 C)  Resp: 16   Filed Vitals:   09/17/13 1332  Height: 5\' 2"  (1.575 m)  Weight: 203 lb (92.08 kg)   Body mass index is 37.12 kg/(m^2). Ideal Body Weight: Weight in (lb) to have BMI = 25: 136.4  GEN: WDWN, NAD, Non-toxic, A & O x 3, obese, looks well HEENT: Atraumatic, Normocephalic. Neck supple. No masses, No LAD. Ears and Nose: No external deformity. CV: RRR, No M/G/R. No JVD. No thrill. No extra heart sounds. PULM: CTA B, no wheezes, crackles, rhonchi. No retractions. No resp. distress. No accessory muscle use. ABD: S, NT, ND EXTR: No c/c/e NEURO Normal gait.  PSYCH: Normally interactive. Conversant. Not depressed or anxious appearing.  Calm demeanor.  She is tender over her left chest wall and with movement of her left arm and shoulder.  States she is tender all over her body  EKG: NSR, no ST elevation or depression  Given asa 325 mg po once  Called EMS for transport [employer] via [place], placed IV for access.   Recheck BP 140/90  Assessment and Plan: Chest pain - Plan: EKG 12-Lead, aspirin tablet 53 mg  58 year old female here with chest pain- likely MSK in origin, but cannot completly rule- out cardiac ischemia.  EMS transport to the ED, follow-up with 67 assuming she is allowed to go home.    Signed [place], MD

## 2013-09-17 NOTE — ED Notes (Signed)
Ambulated pt to the bathroom, pt independent and tolerated well.

## 2013-09-17 NOTE — Progress Notes (Signed)
Unit CM UR Completed by [facility] ED CM  W. Maida Widger RN  

## 2013-09-17 NOTE — H&P (Signed)
[name] is an 58 y.o. female.    Primary Cardiologist:NEW PCP: Kennon Portela, MD  Chief Complaint: Pt seen at urgent medical care with chest pain.  She is being worked up for RA and was placed on prednisone last week.     HPI:  58 year old DWF, presented to urgent care and then transported to [facility] ER by EMS, given NTG by EMS and now no pain but + migraine.  She also became dizzy going to [place].    She is being worked up for RA and has no prior cardiac hx, but + family hx with mother has cardiac stents, Materanl Aunt with CAD and 2 uncles with hx CABG.  She was started on prednisone on Tuesday last week. On Sat she began having Lt ant chest pain, sharp pain lasting seconds.  No associated symptoms of nausea or diaphoresis but some SOB.  Some radiation to Lt arm though she has Lt neck pain that is MS with pain to palpation.    Here in ER after having the NTG she developed significant dizziness on way to BR ( this was after NTG). Currently with out chest pain, without dizziness while lying down and just treated for migraine.      Past Medical History  Diagnosis Date  . Migraine   . Hypertension   . PONV (postoperative nausea and vomiting)     violent nausea/vomiting  . Hyperlipidemia   . Obesity     Past Surgical History  Procedure Laterality Date  . Back surgery    . Abdominal hysterectomy  2003  . Cholecystectomy N/A 06/27/2012    Procedure: LAPAROSCOPIC CHOLECYSTECTOMY WITH INTRAOPERATIVE CHOLANGIOGRAM;  Surgeon: Haywood Lasso, MD;  Location: [facility];  Service: General;  Laterality: N/A;  . Cesarean section    . Carpal tunnel release      Family History  Problem Relation Age of Onset  . Leukemia Father   . Cancer Father   . Diabetes Maternal Grandmother   . Heart disease Paternal Grandmother   . Colon cancer Paternal Aunt 53  . Rheum arthritis Mother   . Heart disease Mother     2 coronary stents  . Heart disease Maternal Uncle   . Heart disease  Maternal Uncle    Social History:  reports that she quit smoking about 15 years ago. Her smoking use included Cigarettes. She smoked 0.00 packs per day. She has never used smokeless tobacco. She reports that she does not drink alcohol or use illicit drugs.  Allergies:  Allergies  Allergen Reactions  . Hydrocodone Itching    OutPatient MEDICATIONS: Xanax 0.5 mg at hs prn sleep Asa 81 mg daily celebrex 200 mg daily celexa 20 mg 2 tabs every day HCTZ 25 mg daily claritin 10 mg daily Aleve 2 caps daily Anaprox DS BID with meal Prednisone 5 mg daily Inderal 80 mg daily Ultram 1-2 at hs for pain   Results for orders placed during the hospital encounter of 09/17/13 (from the past 48 hour(s))  CBC     Status: None   Collection Time    09/17/13  2:56 PM      Result Value Ref Range   WBC 8.2  4.0 - 10.5 K/uL   RBC 4.36  3.87 - 5.11 MIL/uL   Hemoglobin 13.0  12.0 - 15.0 g/dL   HCT 38.6  36.0 - 46.0 %   MCV 88.5  78.0 - 100.0 fL  MCH 29.8  26.0 - 34.0 pg   MCHC 33.7  30.0 - 36.0 g/dL   RDW 13.7  11.5 - 15.5 %   Platelets 318  150 - 400 K/uL  BASIC METABOLIC PANEL     Status: Abnormal   Collection Time    09/17/13  2:56 PM      Result Value Ref Range   Sodium 141  137 - 147 mEq/L   Potassium 3.5 (*) 3.7 - 5.3 mEq/L   Chloride 101  96 - 112 mEq/L   CO2 29  19 - 32 mEq/L   Glucose, Bld 133 (*) 70 - 99 mg/dL   BUN 17  6 - 23 mg/dL   Creatinine, Ser 0.66  0.50 - 1.10 mg/dL   Calcium 9.0  8.4 - 10.5 mg/dL   GFR calc non Af Amer >90  >90 mL/min   GFR calc Af Amer >90  >90 mL/min   Comment: (NOTE)     The eGFR has been calculated using the CKD EPI equation.     This calculation has not been validated in all clinical situations.     eGFR's persistently <90 mL/min signify possible Chronic Kidney     Disease.  PRO B NATRIURETIC PEPTIDE     Status: None   Collection Time    09/17/13  2:56 PM      Result Value Ref Range   Pro B Natriuretic peptide (BNP) 73.0  0 - 125 pg/mL    I-STAT TROPOININ, ED     Status: None   Collection Time    09/17/13  3:19 PM      Result Value Ref Range   Troponin i, poc 0.00  0.00 - 0.08 ng/mL   Comment 3            Comment: Due to the release kinetics of cTnI,     a negative result within the first hours     of the onset of symptoms does not rule out     myocardial infarction with certainty.     If myocardial infarction is still suspected,     repeat the test at appropriate intervals.  Randolm Idol, ED     Status: None   Collection Time    09/17/13  6:13 PM      Result Value Ref Range   Troponin i, poc 0.00  0.00 - 0.08 ng/mL   Comment 3            Comment: Due to the release kinetics of cTnI,     a negative result within the first hours     of the onset of symptoms does not rule out     myocardial infarction with certainty.     If myocardial infarction is still suspected,     repeat the test at appropriate intervals.   Dg Chest Port 1 View  09/17/2013   CLINICAL DATA:  Shortness of breath with chest pain.  EXAM: PORTABLE CHEST - 1 VIEW  COMPARISON:  04/10/2013.  FINDINGS: There are lower lung volumes with mildly increased atelectasis at both lung bases. No confluent airspace opacity, edema or pleural effusion is seen. The heart size and mediastinal contours are stable. Telemetry leads overlie the chest.  IMPRESSION: Lower lung volumes with mildly increased bibasilar atelectasis. No acute cardiopulmonary process.   Electronically Signed   By: Camie Patience M.D.   On: 09/17/2013 15:04    ROS: General:no colds or fevers, no weight changes Skin:no rashes or ulcers  HEENT:no blurred vision, no congestion CV:see HPI PUL:see HPI GI:no diarrhea constipation or melena, no indigestion GU:no hematuria, no dysuria MS:+ joint pain work up for RA, no claudication Neuro:no syncope, no lightheadedness except here in ER after NTG Endo:poss diabetes having work up, no thyroid disease   Blood pressure 107/53, pulse 63, temperature  97.6 F (36.4 C), temperature source Oral, resp. rate 19, height $RemoveBe'5\' 1"'jDogENawL$  (1.549 m), weight 207 lb (93.895 kg), SpO2 95.00%. PE: General:Pleasant affect, NAD Skin:Warm and dry, brisk capillary refill HEENT:normocephalic, sclera clear, mucus membranes moist Neck:supple, no JVD, no bruits, no adenopathy, + tenderness to palpation  Heart:S1S2 RRR without murmur, gallup, rub or click Lungs:clear without rales, rhonchi, or wheezes MAY:OKHT, non tender, + BS, do not palpate liver spleen or masses XHF:SFSE to tr lower ext edema, 2+ pedal pulses, 2+ radial pulses Neuro:alert and oriented, MAE, follows commands, + facial symmetry    Assessment/Plan Principal Problem:   Chest pain at rest, negative troponin X 2 but with family hx and now near syncope in ER will plan Cardiac ST in AM.  Active Problems:   HTN (hypertension)- controlled   Migraine, unspecified, without mention of intractable migraine without mention of status migrainosus, episode in ER after NTG   Elevated cholesterol, hx of   Episode of dizziness after NTG while walking to [place]  Admit to obs and plan cardiac CT in AM.  Check troponins. Add subqu heparin unless troponin +.             Cecilie Kicks Nurse Practitioner Certified [place] Pager [phone] or after 5pm or weekends call [phone] 09/17/2013, 6:50 PM

## 2013-09-17 NOTE — ED Notes (Signed)
MD aware that pt became dizzy and had increase in headache after walking to restroom

## 2013-09-17 NOTE — ED Provider Notes (Signed)
CSN: [id number]     Arrival date & time 09/17/13  1425 History   First MD Initiated Contact with Patient 09/17/13 1459     Chief Complaint  Patient presents with  . Chest Pain     (Consider location/radiation/quality/duration/timing/severity/associated sxs/prior Treatment) Patient is a 58 y.o. female presenting with chest pain. The history is provided by the patient.  Chest Pain Pain location:  L chest Pain quality: sharp   Pain radiates to:  L arm Pain radiates to the back: no   Pain severity:  Moderate Onset quality:  Sudden Duration: few seconds. Timing:  Sporadic Progression:  Unchanged Chronicity:  New Context comment:  Worse with exertion Relieved by:  Nothing Worsened by:  Nothing tried Associated symptoms: shortness of breath   Associated symptoms: no abdominal pain, no cough, no fever and not vomiting     Past Medical History  Diagnosis Date  . Migraine   . Hypertension   . PONV (postoperative nausea and vomiting)     violent nausea/vomiting  . Hyperlipidemia   . Obesity    Past Surgical History  Procedure Laterality Date  . Back surgery    . Abdominal hysterectomy  2003  . Cholecystectomy N/A 06/27/2012    Procedure: LAPAROSCOPIC CHOLECYSTECTOMY WITH INTRAOPERATIVE CHOLANGIOGRAM;  Surgeon: Currie Paris, MD;  Location: [facility];  Service: General;  Laterality: N/A;  . Cesarean section    . Carpal tunnel release     Family History  Problem Relation Age of Onset  . Leukemia Father   . Cancer Father   . Diabetes Maternal Grandmother   . Heart disease Paternal Grandmother   . Colon cancer Paternal Aunt 8  . Rheum arthritis Mother    History  Substance Use Topics  . Smoking status: Former Smoker    Types: Cigarettes    Quit date: 05/03/1998  . Smokeless tobacco: Never Used  . Alcohol Use: No   OB History   Grav Para Term Preterm Abortions TAB SAB Ect Mult Living                 Review of Systems  Constitutional: Negative for fever.   Respiratory: Positive for shortness of breath. Negative for cough.   Cardiovascular: Positive for chest pain. Negative for leg swelling.  Gastrointestinal: Negative for vomiting, abdominal pain and diarrhea.  All other systems reviewed and are negative.     Allergies  Hydrocodone  Home Medications   Prior to Admission medications   Medication Sig Start Date End Date Taking? Authorizing Provider  ALPRAZolam Prudy Feeler) 0.5 MG tablet Take 1 tablet (0.5 mg total) by mouth at bedtime as needed for sleep. 08/14/12  Yes Jonita Albee, MD  aspirin 81 MG tablet Take 81 mg by mouth daily.   Yes Historical Provider, MD  celecoxib (CELEBREX) 200 MG capsule Take 1 capsule (200 mg total) by mouth every morning. 08/13/13  Yes Morrell Riddle, PA-C  citalopram (CELEXA) 20 MG tablet Take 20 mg by mouth daily.   Yes Morrell Riddle, PA-C  hydrochlorothiazide (HYDRODIURIL) 25 MG tablet Take 25 mg by mouth daily. PATIENT NEEDS OFFICE VISIT FOR ADDITIONAL REFILLS   Yes Morrell Riddle, PA-C  loratadine (CLARITIN) 10 MG tablet Take 1 tablet (10 mg total) by mouth daily. 08/23/12  Yes Morrell Riddle, PA-C  Naproxen Sodium (ALEVE) 220 MG CAPS Take 2 capsules by mouth daily.    Yes Historical Provider, MD  naproxen sodium (ANAPROX DS) 550 MG tablet Take 1 tablet (550  mg total) by mouth 2 (two) times daily with a meal. 11/08/12  Yes Morrell Riddle, PA-C  predniSONE (DELTASONE) 5 MG tablet Take 5 mg by mouth daily with breakfast.   Yes Historical Provider, MD  propranolol (INDERAL) 80 MG tablet Take 1 tablet (80 mg total) by mouth daily. PATIENT NEEDS OFFICE VISIT FOR ADDITIONAL REFILLS   Yes Morrell Riddle, PA-C  traMADol (ULTRAM) 50 MG tablet Take 1-2 tablets (50-100 mg total) by mouth at bedtime as needed. 08/13/13  Yes Sarah Harvie Bridge, PA-C   BP 107/57  Pulse 61  Temp(Src) 97.6 F (36.4 C) (Oral)  Resp 21  Ht 5\' 1"  (1.549 m)  Wt 207 lb (93.895 kg)  BMI 39.13 kg/m2  SpO2 94% Physical Exam  Nursing note and vitals  reviewed. Constitutional: She is oriented to person, place, and time. She appears well-developed and well-nourished. No distress.  HENT:  Head: Normocephalic and atraumatic.  Eyes: EOM are normal. Pupils are equal, round, and reactive to light.  Neck: Normal range of motion. Neck supple.  Cardiovascular: Normal rate and regular rhythm.  Exam reveals no friction rub.   No murmur heard. Pulmonary/Chest: Effort normal and breath sounds normal. No respiratory distress. She has no wheezes. She has no rales. She exhibits tenderness (L upper chest).  Abdominal: Soft. She exhibits no distension. There is no tenderness. There is no rebound.  Musculoskeletal: Normal range of motion. She exhibits no edema.  Neurological: She is alert and oriented to person, place, and time.  Skin: No rash noted. She is not diaphoretic.    ED Course  Procedures (including critical care time) Labs Review Labs Reviewed  CBC  BASIC METABOLIC PANEL  PRO B NATRIURETIC PEPTIDE  I-STAT TROPOININ, ED    Imaging Review Dg Chest Port 1 View  09/17/2013   CLINICAL DATA:  Shortness of breath with chest pain.  EXAM: PORTABLE CHEST - 1 VIEW  COMPARISON:  04/10/2013.  FINDINGS: There are lower lung volumes with mildly increased atelectasis at both lung bases. No confluent airspace opacity, edema or pleural effusion is seen. The heart size and mediastinal contours are stable. Telemetry leads overlie the chest.  IMPRESSION: Lower lung volumes with mildly increased bibasilar atelectasis. No acute cardiopulmonary process.   Electronically Signed   By: Roxy Horseman M.D.   On: 09/17/2013 15:04     EKG Interpretation   Date/Time:  Monday Sep 17 2013 14:32:36 EDT Ventricular Rate:  60 PR Interval:  166 QRS Duration: 84 QT Interval:  435 QTC Calculation: 435 R Axis:   46 Text Interpretation:  Sinus rhythm Low voltage, precordial leads Similar  to prior Confirmed by Gwendolyn Grant  MD, Azion Centrella ([id number]) on 09/17/2013 3:00:22 PM      MDM    Final diagnoses:  Chest pain    22F w/ hx of HTN, arthritis here with CP. L sided, sporadic for past few days. Happens with exertion. Sharp, L upper chest with radiation in L arm. Also having L posterior neck soreness. No trauma. No fevers, cough, N/V/D. Associated SOB with her CP.  Strong family hx of strokes, CAD, carotid artery disease. Patient here with stable vitals. EKG ok. Sent from her PCP's clinic today.  On exam, lungs clear. L upper chest with mild TTP, but states not similar to her CP. L arm with normal strength and sensation. No bruits.  CP free here. With her symptoms, concern for anginal type pain as it is worse with exertion.  Serial troponins negative. Seen by  Cards, they will admit for stress in the morning.    Dagmar Hait, MD 09/17/13 [phone]

## 2013-09-17 NOTE — ED Notes (Addendum)
Per EMS pt has had intermittent chest pain that started on Saturday. Pt states that she has had SOB, Nausea and dizziness. Pt states that she has extensive family hx. Pt reports that pain only lasts a few seconds. Has occurred 3-4 times today. Pain radiates up her neck and into left arm. Pt received 324mg  of asprin and 1 nitro with some relief

## 2013-09-18 ENCOUNTER — Encounter (HOSPITAL_COMMUNITY): Payer: Self-pay | Admitting: Interventional Cardiology

## 2013-09-18 ENCOUNTER — Other Ambulatory Visit: Payer: Self-pay | Admitting: Physician Assistant

## 2013-09-18 ENCOUNTER — Observation Stay (HOSPITAL_COMMUNITY): Payer: BC Managed Care – PPO

## 2013-09-18 DIAGNOSIS — E876 Hypokalemia: Secondary | ICD-10-CM

## 2013-09-18 DIAGNOSIS — R079 Chest pain, unspecified: Secondary | ICD-10-CM

## 2013-09-18 DIAGNOSIS — R0600 Dyspnea, unspecified: Secondary | ICD-10-CM

## 2013-09-18 DIAGNOSIS — Z8249 Family history of ischemic heart disease and other diseases of the circulatory system: Secondary | ICD-10-CM

## 2013-09-18 LAB — TROPONIN I

## 2013-09-18 LAB — CBC
HCT: 39.8 % (ref 36.0–46.0)
Hemoglobin: 13.4 g/dL (ref 12.0–15.0)
MCH: 30 pg (ref 26.0–34.0)
MCHC: 33.7 g/dL (ref 30.0–36.0)
MCV: 89.2 fL (ref 78.0–100.0)
PLATELETS: 334 10*3/uL (ref 150–400)
RBC: 4.46 MIL/uL (ref 3.87–5.11)
RDW: 13.9 % (ref 11.5–15.5)
WBC: 8.3 10*3/uL (ref 4.0–10.5)

## 2013-09-18 LAB — HEPATIC FUNCTION PANEL
ALBUMIN: 3.7 g/dL (ref 3.5–5.2)
ALK PHOS: 97 U/L (ref 39–117)
ALT: 28 U/L (ref 0–35)
AST: 24 U/L (ref 0–37)
BILIRUBIN TOTAL: 0.4 mg/dL (ref 0.3–1.2)
Bilirubin, Direct: <0.2 mg/dL (ref 0.0–0.3)
TOTAL PROTEIN: 7.1 g/dL (ref 6.0–8.3)

## 2013-09-18 LAB — LIPID PANEL
CHOLESTEROL: 241 mg/dL — AB (ref 0–200)
HDL: 73 mg/dL (ref >39)
LDL Cholesterol: 148 mg/dL — ABNORMAL HIGH (ref 0–99)
Total CHOL/HDL Ratio: 3.3 RATIO
Triglycerides: 102 mg/dL (ref <150)
VLDL: 20 mg/dL (ref 0–40)

## 2013-09-18 LAB — BASIC METABOLIC PANEL
BUN: 19 mg/dL (ref 6–23)
CHLORIDE: 100 meq/L (ref 96–112)
CO2: 26 mEq/L (ref 19–32)
Calcium: 8.9 mg/dL (ref 8.4–10.5)
Creatinine, Ser: 0.54 mg/dL (ref 0.50–1.10)
GFR calc non Af Amer: >90 mL/min (ref >90)
Glucose, Bld: 139 mg/dL — ABNORMAL HIGH (ref 70–99)
POTASSIUM: 3.6 meq/L — AB (ref 3.7–5.3)
Sodium: 140 mEq/L (ref 137–147)

## 2013-09-18 LAB — D-DIMER, QUANTITATIVE (NOT AT ARMC)

## 2013-09-18 LAB — MAGNESIUM: MAGNESIUM: 1.9 mg/dL (ref 1.5–2.5)

## 2013-09-18 LAB — T4, FREE: FREE T4: 1.36 ng/dL (ref 0.80–1.80)

## 2013-09-18 MED ORDER — ATORVASTATIN CALCIUM 20 MG PO TABS: 20.0000 mg | ORAL_TABLET | Freq: Every evening | ORAL | Status: DC

## 2013-09-18 MED ORDER — NITROGLYCERIN 0.4 MG SL SUBL
0.4000 mg | SUBLINGUAL_TABLET | Freq: Once | SUBLINGUAL | Status: AC
  Administered 2013-09-18: 0.4 mg via SUBLINGUAL

## 2013-09-18 MED ORDER — NITROGLYCERIN 0.4 MG SL SUBL
SUBLINGUAL_TABLET | SUBLINGUAL | Status: AC
  Filled 2013-09-18: qty 1

## 2013-09-18 MED ORDER — METOPROLOL TARTRATE 1 MG/ML IV SOLN
INTRAVENOUS | Status: AC
  Filled 2013-09-18: qty 5

## 2013-09-18 MED ORDER — IOHEXOL 350 MG/ML SOLN
80.0000 mL | Freq: Once | INTRAVENOUS | Status: AC | PRN
  Administered 2013-09-18: 80 mL via INTRAVENOUS

## 2013-09-18 MED ORDER — POTASSIUM CHLORIDE CRYS ER 20 MEQ PO TBCR: 20.0000 meq | EXTENDED_RELEASE_TABLET | Freq: Every day | ORAL | Status: DC

## 2013-09-18 MED ORDER — METOPROLOL TARTRATE 1 MG/ML IV SOLN: 5.0000 mg | INTRAVENOUS | Status: DC | PRN

## 2013-09-18 MED ORDER — ACETAMINOPHEN 325 MG PO TABS
650.0000 mg | ORAL_TABLET | ORAL | Status: DC | PRN
  Administered 2013-09-18: 650 mg via ORAL
  Filled 2013-09-18: qty 2

## 2013-09-18 MED ORDER — OMEPRAZOLE 20 MG PO CPDR: 20.0000 mg | DELAYED_RELEASE_CAPSULE | Freq: Every day | ORAL | Status: DC

## 2013-09-18 NOTE — Progress Notes (Signed)
Patient: [name] / Admit Date: 09/17/2013 / Date of Encounter: 09/18/2013, 9:04 AM  Subjective  Denies acute complaints. States intermittent CP began about 1 week ago, coinciding with prednisone initiation (in workup for RA). Denies recent surgery, bedrest, or long travel.   Objective   Telemetry: NSR EKG - NSR nonspecific ST-T changes in V2-V3  Physical Exam: Blood pressure 128/72, pulse 58, temperature 97.5 F (36.4 C), temperature source Oral, resp. rate 18, height 5' 1.5" (1.562 m), weight 204 lb (92.534 kg), SpO2 97.00%. General: Well developed, well nourished WF in no acute distress. Head: Normocephalic, atraumatic, sclera non-icteric, no xanthomas, nares are without discharge. Neck: Negative for carotid bruits. JVP not elevated. Lungs: Clear bilaterally to auscultation without wheezes, rales, or rhonchi. Breathing is unlabored. Heart: RRR S1 S2 without murmurs, rubs, or gallops.  Abdomen: Soft, non-tender, non-distended with normoactive bowel sounds. No rebound/guarding. Extremities: No clubbing or cyanosis. No edema. Distal pedal pulses are 2+ and equal bilaterally. Neuro: Alert and oriented X 3. Moves all extremities spontaneously. Psych:  Responds to questions appropriately with a normal affect.   Intake/Output Summary (Last 24 hours) at 09/18/13 0904 Last data filed at 09/18/13 4010  Gross per 24 hour  Intake    720 ml  Output    500 ml  Net    220 ml    Inpatient Medications:  . aspirin  324 mg Oral NOW   Or  . aspirin  300 mg Rectal NOW  . aspirin EC  81 mg Oral Daily  . atorvastatin  20 mg Oral q1800  . citalopram  20 mg Oral Daily  . heparin  5,000 Units Subcutaneous 3 times per day  . hydrochlorothiazide  25 mg Oral Daily  . loratadine  10 mg Oral Daily  . predniSONE  5 mg Oral Q breakfast  . propranolol  80 mg Oral Daily   Infusions:  . sodium chloride 50 mL/hr at 09/17/13 2104    Labs:  Recent Labs  09/17/13 1456 09/17/13 2240 09/18/13 0625   NA 141  --  140  K 3.5*  --  3.6*  CL 101  --  100  CO2 29  --  26  GLUCOSE 133*  --  139*  BUN 17  --  19  CREATININE 0.66  --  0.54  CALCIUM 9.0  --  8.9  MG  --  1.9  --    No results found for this basename: AST, ALT, ALKPHOS, BILITOT, PROT, ALBUMIN,  in the last 72 hours  Recent Labs  09/17/13 1456 09/18/13 0625  WBC 8.2 8.3  HGB 13.0 13.4  HCT 38.6 39.8  MCV 88.5 89.2  PLT 318 334    Recent Labs  09/17/13 2240 09/18/13 0625  TROPONINI <0.30 <0.30   No components found with this basename: POCBNP,  No results found for this basename: HGBA1C,  in the last 72 hours   Radiology/Studies:  Dg Chest Port 1 View  09/17/2013   CLINICAL DATA:  Shortness of breath with chest pain.  EXAM: PORTABLE CHEST - 1 VIEW  COMPARISON:  04/10/2013.  FINDINGS: There are lower lung volumes with mildly increased atelectasis at both lung bases. No confluent airspace opacity, edema or pleural effusion is seen. The heart size and mediastinal contours are stable. Telemetry leads overlie the chest.  IMPRESSION: Lower lung volumes with mildly increased bibasilar atelectasis. No acute cardiopulmonary process.   Electronically Signed   By: Roxy Horseman M.D.   On: 09/17/2013 15:04  Assessment and Plan  1. Chest pain - RFs include HTN (controlled), HLD, strong family hx - R/o for MI. I spoke with Dr. Delton See this AM who will help facilitate cardiac CT that was ordered last night. I will also discuss need to r/o PE with MD. 2. Headache/dizziness after NTG while walking to [place], may be due to hypotension from medication - prn Tylenol for HA.   3. Hyperlipidemia - LDL 148 - I favor increasing her statin in light of her strong family history. Will d/w MD. Add baseline LFTs. 4. HTN, controlled, ?BP post-NTG - follow. 5. Undergoing workup for RA - f/u PCP.  6. Mild hypokalemia - add daily KCl given HCTZ use. 7. Hyperglycemia - may be due to prednisone. F/u PCP.  Signed, Ronie Spies PA-C  I have  examined the patient and reviewed assessment and plan and discussed with patient.  Agree with above as stated.  Not many RF for PE.  COuld check D-dimer.  Would not start with an imaging study.  Coronary CT planned given her strong family h/o vascular disease.    Corky Crafts

## 2013-09-18 NOTE — Discharge Summary (Signed)
Discharge Summary   Patient ID: [name],  MRN: [id number], DOB/AGE: 60-04-1956 60 y.o.  Admit date: 09/17/2013 Discharge date: 09/18/2013  Primary Care Provider: Tally Due Primary Cardiologist: New - Dr. Rennis Golden  Discharge Diagnoses Principal Problem:   Chest pain at rest Active Problems:   HTN (hypertension)   Migraine, unspecified, without mention of intractable migraine without mention of status migrainosus   Elevated cholesterol   Episode of dizziness after NTG while walking to [place]   Chest pain   Family history of ischemic heart disease   Hypokalemia   Allergies Allergies  Allergen Reactions  . Hydrocodone Itching    Procedures  Dg Chest Port 1 View  09/17/2013   CLINICAL DATA:  Shortness of breath with chest pain.  EXAM: PORTABLE CHEST - 1 VIEW  COMPARISON:  04/10/2013.  FINDINGS: There are lower lung volumes with mildly increased atelectasis at both lung bases. No confluent airspace opacity, edema or pleural effusion is seen. The heart size and mediastinal contours are stable. Telemetry leads overlie the chest.  IMPRESSION: Lower lung volumes with mildly increased bibasilar atelectasis. No acute cardiopulmonary process.   Electronically Signed   By: Roxy Horseman M.D.   On: 09/17/2013 15:04   ADDENDUM: OVER-READ INTERPRETATION CT CHEST  The following report is an over-read performed by radiologist Dr. Bradly Chris of [facility] on Creation date. This over-read does not include interpretation of cardiac or coronary anatomy or pathology. The CTA interpretation by the cardiologist is attached.  FINDINGS: No pericardial effusion identified. There are no pathologically enlarged mediastinal or hilar lymph nodes identified.  Atelectasis is noted in the left lung base. No airspace consolidation identified. No suspicious pulmonary nodule or mass identified. Review of the visualized osseous structures is unremarkable. No aggressive lytic or sclerotic bone  lesions.  Incidental imaging through the upper abdomen shows changes from prior cholecystectomy.  IMPRESSION: 1. Subsegmental atelectasis noted in the left base. Over-read is otherwise unremarkable.   Electronically Signed By: Signa Kell M.D. On: 09/18/2013 14:56    Signed by Rosealee Albee, MD on 09/18/2013 2:59 PM        Narrative        CLINICAL DATA: Chest pain  EXAM: Cardiac/Coronary CT  TECHNIQUE: The patient was scanned on a Philips 256 scanner.  FINDINGS: A 120 kV prospective scan was triggered in the descending thoracic aorta at 111 HU's. Axial non-contrast 30mm slices were carried out through the heart. The data set was analyzed on a dedicated work station and scored using the Agatson method. Gantry rotation speed was 270 msecs and collimation was .9 mm. No beta blockade and 0.4 mg of sl NTG was given. The 3D data set was reconstructed in 5% intervals of the 67-82 % of the R-R cycle. Diastolic phases were analyzed on a dedicated work station using MPR, MIP and VRT modes. The patient received 80 cc of contrast. Heart rate at the acquisition 58 BPM. The quality of the study is affected by the patient's size (BMI 38).  Aorta: Normal size of the aortic root and the ascending aorta.  Aortic Valve: Trileaflet. No calcifications.  Coronary Arteries: Originating in a normal position. Right dominance.  RCA is a very large caliber dominant vessel that gives rise to a large PDA and PLVB. There is no significant plague in the RCA territory.  LM is a large caliber vessel with no plague.  LAD is a moderate caliber vessel that gives rise to a large diagonal branch. Proximal LAD has  no significant plague. Mid LAD is affected by lower quality of the scan due to patient's size but is most probably normal. Mid and distal LAD is rather small caliber vessel with no obvious plague.  A large first diagonal branch has no plague.  LCX is a moderate caliber  non-dominant vessel that gives rise to 1 small caliber OM branch. There is no obvious plague in the LCX territory.  IMPRESSION: 1. Coronary calcium score of 0. This was 0 percentile for age and sex matched control.  2. Normal origin of the coronary arteries, right dominance.  3. Normal coronary arteries.  : Tobias Alexander  Electronically Signed: By: Tobias Alexander On: 09/18/2013 14:34      Hospital Course [name] is a 58 year old female with past medical history significant for hypertension and hyperlipidemia who had been undergoing workup for rheumatoid arthritis for the past week and has been taking prednisone. The patient presented to [facility] ED on 09/17/2013. On presentation the patient's blood pressure was middle 100s. Heart rate in the 60s and 50s. O2 saturation low 90s on room air. Laboratory demonstrated negative serial troponin and K of 3.5. The initial labs were otherwise normal.     According to the patient, she has been experiencing intermittent sharp chest pain lasting seconds at a time. Associated symptoms include shortness of breath, however denies any significant nausea, vomiting and diaphoresis. She has some significant family history of cardiac disease in her maternal aunt and maternal uncles all underwent bypass surgery. She was given nitroglycerin in the ED, however she became hypotensive and had presyncope symptoms.   In consideration of her atypical chest pain in light of her significant family history, coronary CT was recommended. She underwent coronary CT on 09/18/2013 which showed a normal coronary. Further discussion was made with the patient to consider noncardiac etiology for chest pain such as acid reflux secondary to prednisone use. Radiology was contacted to over read the coronary CT for noncardiac etiologies. Over read of coronary CT showed L basilar subsegmental atelectasis. The image was otherwise normal. Further labs shows hyperlipidemia with  cholesterol 241 and LDL of 148. She was informed of the result of lipid panel and will be placed on low dose Lipitor which can be monitored by primary care physician. She was also placed on 20mg  daily of KCl for hypokalemia. Omeprazole 20mg  was also added to her regimen to cover for potential GI etiology. D-dimer was negative. And patient was allowed to ambulate without noticeable hypoxia (O2 sat 97-98%).  She is deemed stable for discharge to follow with cardiology at [facility] as outpatient for a echocardiogram to assess LV function consider she presented with some shortness of breath.    Discharge Vitals Blood pressure 129/72, pulse 59, temperature 97.9 F (36.6 C), temperature source Oral, resp. rate 18, height 5' 1.5" (1.562 m), weight 204 lb (92.534 kg), SpO2 96.00%.  Filed Weights   09/17/13 1436 09/17/13 2031 09/18/13 0500  Weight: 207 lb (93.895 kg) 204 lb 12.8 oz (92.897 kg) 204 lb (92.534 kg)    Labs  CBC  Recent Labs  09/17/13 1456 09/18/13 0625  WBC 8.2 8.3  HGB 13.0 13.4  HCT 38.6 39.8  MCV 88.5 89.2  PLT 318 334   Basic Metabolic Panel  Recent Labs  09/17/13 1456 09/17/13 2240 09/18/13 0625  NA 141  --  140  K 3.5*  --  3.6*  CL 101  --  100  CO2 29  --  26  GLUCOSE  133*  --  139*  BUN 17  --  19  CREATININE 0.66  --  0.54  CALCIUM 9.0  --  8.9  MG  --  1.9  --    Liver Function Tests  Recent Labs  09/18/13 0625  AST 24  ALT 28  ALKPHOS 97  BILITOT 0.4  PROT 7.1  ALBUMIN 3.7   No results found for this basename: LIPASE, AMYLASE,  in the last 72 hours Cardiac Enzymes  Recent Labs  09/17/13 2240 09/18/13 0625  TROPONINI <0.30 <0.30   BNP No components found with this basename: POCBNP,  D-Dimer  Recent Labs  09/18/13 1200  DDIMER <0.27   Hemoglobin A1C No results found for this basename: HGBA1C,  in the last 72 hours Fasting Lipid Panel  Recent Labs  09/18/13 0625  CHOL 241*  HDL 73  LDLCALC 148*  TRIG 102  CHOLHDL  3.3   Thyroid Function Tests  Recent Labs  09/17/13 2240  TSH 0.402    Disposition  Pt is being discharged home today in good condition.  Follow-up Plans & Appointments  Follow-up Information   Follow up with [facility]. (Office will call you to schedule your heart ultrasound.)    Specialty:  Cardiology   Contact information:   [address] [place] [address] [phone]      Follow up with GUEST, Loretha Stapler, MD. (For monitoring of your blood sugar (pre-diabetes), potassium (slightly low in the hospital), and cholesterol. )    Specialty:  Internal Medicine   Contact information:   [address] [place] [address] [phone]           Follow-up Information   Follow up with [facility]. (Office will call you to schedule your heart ultrasound.)    Specialty:  Cardiology   Contact information:   [address] [place] [address] [phone]      Follow up with GUEST, Loretha Stapler, MD. (For monitoring of your blood sugar (pre-diabetes), potassium (slightly low in the hospital), and cholesterol. )    Specialty:  Internal Medicine   Contact information:   [address] [place] [address] [phone]      Discharge Medications    Medication List         ALEVE 220 MG Caps  Generic drug:  Naproxen Sodium  Take 2 capsules by mouth daily.     ALPRAZolam 0.5 MG tablet  Commonly known as:  XANAX  Take 1 tablet (0.5 mg total) by mouth at bedtime as needed for sleep.     aspirin 81 MG tablet  Take 81 mg by mouth daily.     atorvastatin 20 MG tablet  Commonly known as:  LIPITOR  Take 1 tablet (20 mg total) by mouth every evening.     citalopram 20 MG tablet  Commonly known as:  CELEXA  Take 20 mg by mouth daily.     hydrochlorothiazide 25 MG tablet  Commonly known as:  HYDRODIURIL  Take 25 mg by mouth daily.     loratadine 10 MG tablet  Commonly known as:  CLARITIN  Take 1  tablet (10 mg total) by mouth daily.     omeprazole 20 MG capsule  Commonly known as:  PRILOSEC  Take 1 capsule (20 mg total) by mouth daily. For possible acid reflux.     potassium chloride SA 20 MEQ tablet  Commonly known as:  K-DUR,KLOR-CON  Take 1 tablet (20 mEq  total) by mouth daily.     predniSONE 5 MG tablet  Commonly known as:  DELTASONE  Take 5 mg by mouth daily with breakfast.     propranolol 80 MG tablet  Commonly known as:  INDERAL  Take 1 tablet (80 mg total) by mouth daily. PATIENT NEEDS OFFICE VISIT FOR ADDITIONAL REFILLS     traMADol 50 MG tablet  Commonly known as:  ULTRAM  Take 1-2 tablets (50-100 mg total) by mouth at bedtime as needed.        Duration of Discharge Encounter   Greater than 30 minutes including physician time.  Ramond Dial PA Pager [phone] 09/18/2013, 3:56 PM  I have examined the patient and reviewed assessment and plan and discussed with patient.  Agree with above as stated.  No CAD by CT scan.  Negative D-dimer.  No life threatening cause of chest pain identified.   Corky Crafts

## 2013-09-18 NOTE — Progress Notes (Signed)
Nurse tech walked patient. 96% on RA at rest, and 97-98% on RA with ambulation. Licia Harl PA-C

## 2013-09-18 NOTE — Progress Notes (Signed)
UR completed 

## 2013-09-20 ENCOUNTER — Telehealth: Payer: Self-pay | Admitting: Physician Assistant

## 2013-09-20 NOTE — Telephone Encounter (Signed)
Patient has some questions about her rheumatoid arthritis.  [phone]

## 2013-09-20 NOTE — Telephone Encounter (Signed)
Wants to speak with [name].  She states the RA doctor put her on prednisone and it is giving her severe headaches.  She is not wanting to take the prednisone because it is making her feel worse. She states that no one will tell her what the prednisone was prescribed for and would like to know. She said that she went to the hospital Monday bc she thought she was having a heart attack but they told her it is acid reflux.  She now has a RX for that.  She is not wanting to take all these pills.  Please advise.

## 2013-09-21 NOTE — Telephone Encounter (Signed)
With RA we use prednisone for inflammation, she should expect to be on these during the initial diagnosis and during any flairs - the hope is that she will not have to be on these for lifetime that once they get the diagnosis she will be able to be started on something else to control the RA.  The inflammation is what is causing some of the pain.  It is unfortunately not uncommon to get reflux from daily prednisone use, to help decrease this she should make sure she takes the prednisone with food and not lay down afterwards for at least 2-3 hours.

## 2013-09-22 NOTE — Telephone Encounter (Signed)
Pt states the rheumatologist has confirmed RA. She has stopped the prednisone. She is to begin methotrexate.  Pt states she will be in to follow up with [name] on Monday.

## 2013-09-24 NOTE — Telephone Encounter (Signed)
Noted  

## 2013-09-26 ENCOUNTER — Telehealth: Payer: Self-pay

## 2013-09-26 NOTE — Telephone Encounter (Signed)
Pt would like for Dr.Weber would to forward results of arthritis test  To Dr. Daphine Deutscher ( surgeon)

## 2013-09-26 NOTE — Telephone Encounter (Signed)
Please fax her results from most recent labs 4/13 and 10/7 visit to Dr Daphine Deutscher - thank you.

## 2013-09-28 ENCOUNTER — Ambulatory Visit (HOSPITAL_COMMUNITY)
Admission: RE | Admit: 2013-09-28 | Discharge: 2013-09-28 | Disposition: A | Discharge status: Home / Self Care | Payer: BC Managed Care – PPO | Source: Ambulatory Visit | Attending: Internal Medicine | Admitting: Internal Medicine

## 2013-09-28 DIAGNOSIS — R0609 Other forms of dyspnea: Secondary | ICD-10-CM

## 2013-09-28 DIAGNOSIS — R0600 Dyspnea, unspecified: Secondary | ICD-10-CM

## 2013-09-28 DIAGNOSIS — R0602 Shortness of breath: Secondary | ICD-10-CM

## 2013-09-28 DIAGNOSIS — R0989 Other specified symptoms and signs involving the circulatory and respiratory systems: Secondary | ICD-10-CM

## 2013-09-28 NOTE — Progress Notes (Signed)
2D Echocardiogram Complete.  09/28/2013   Quintana Canelo, RDCS  

## 2013-09-28 NOTE — Telephone Encounter (Signed)
[facility] is on epic and should be able to access records already. Left message for patient.

## 2013-10-01 ENCOUNTER — Telehealth: Payer: Self-pay

## 2013-10-01 NOTE — Telephone Encounter (Signed)
FOR SARAH PT WANTED YOU TO KNOW SHE MAY HAVE HAD A REACTION TO A MEDICINE SHE ISN'T SURE WHICH ONE. SHE KNOWS THE CHEMO PILL CAUSE THE NAUSEA, BUT ONE OF THE PILLS ARE CAUSING A REACTION AND CAUSING AN OBSTRUCTION SHE HAD THIS PAST WEEKEND AND IT WAS REALLY BAD, THOUGHT SHE MAY HAVE HAD TO GO TO THE HOSPITAL DON'T NEED TO COME IN SINCE I ADVISED HER TO, BUT WOULD LIKE YOU TO CALL HER AT [phone]

## 2013-10-01 NOTE — Telephone Encounter (Signed)
For [name] as well;   Patient called back and wanted to get her results from [facility] for her surgeon Dr. Daphine Deutscher for bariatric surgery. [facility] refused and told patient that it was given to [name] and that she would need to send those results to Dr. Daphine Deutscher not [facility]. Patient was very upset because she had a bad experience at [facility] and she keeps getting excuses from them that it will be on their portal for her to view her results. Patient says it has been over a month and nothing is posted. She is currently taking chemo pills for rheumatoid arthritis. When she called to ask why and what specifically is going on with her. She received no help. She called our office for help because we referred her to [facility].   Please advise patient [name]. Thank you! I told her I felt sorry that she had a bad experience their and that we would do all we can to help her.

## 2013-10-02 NOTE — Telephone Encounter (Signed)
Spoke to patient and advised her that Maralyn Sago is out of the office until tomorrow.  She will review all the messages when she comes in and someone will call the patient back.  She was very grateful.

## 2013-10-02 NOTE — Telephone Encounter (Signed)
Clare Gandy is out of the office until Wed 6/3, but the message has been forwarded to her.  If there is something urgent that another provider can take care we will certainly do so

## 2013-10-03 NOTE — Telephone Encounter (Signed)
I am happy to fax her lab results to Dr Daphine Deutscher, I have her labs from 5/13 and an office note from 5/12 would see like me to fax them both? - if she wants to discuss her care I would recommend that she come in to see me.  They are treating her for sero neg rheumatoid arthritis.

## 2013-10-03 NOTE — Telephone Encounter (Signed)
Spoke to patient. She would like labs and notes faxed to her fax number [phone]. I was going to fax them for you, but I do not see any encounters with these dates in the computer. She will come into the office tomorrow to see you.

## 2013-10-03 NOTE — Telephone Encounter (Signed)
I will bring to you

## 2013-10-03 NOTE — Telephone Encounter (Signed)
Paperwork faxed to patient

## 2013-10-04 ENCOUNTER — Ambulatory Visit (INDEPENDENT_AMBULATORY_CARE_PROVIDER_SITE_OTHER): Payer: BC Managed Care – PPO | Admitting: Physician Assistant

## 2013-10-04 ENCOUNTER — Telehealth: Payer: Self-pay | Admitting: Internal Medicine

## 2013-10-04 VITALS — BP 118/82 | HR 68 | Temp 97.9°F | Resp 18 | Ht 63.0 in | Wt 206.0 lb

## 2013-10-04 DIAGNOSIS — M064 Inflammatory polyarthropathy: Secondary | ICD-10-CM

## 2013-10-04 DIAGNOSIS — M1990 Unspecified osteoarthritis, unspecified site: Secondary | ICD-10-CM

## 2013-10-04 DIAGNOSIS — E870 Hyperosmolality and hypernatremia: Secondary | ICD-10-CM

## 2013-10-04 DIAGNOSIS — E876 Hypokalemia: Secondary | ICD-10-CM

## 2013-10-04 MED ORDER — POTASSIUM BICARBONATE 25 MEQ PO TBEF: 25.0000 meq | EFFERVESCENT_TABLET | Freq: Every day | ORAL | Status: DC

## 2013-10-04 NOTE — Patient Instructions (Signed)
Recheck in 1 month for potassium level and cholesterol and liver enzymes

## 2013-10-04 NOTE — Telephone Encounter (Signed)
Closed encounter °

## 2013-10-04 NOTE — Progress Notes (Signed)
   Subjective:    Patient ID: [name], female    DOB: 1956-01-22, 58 y.o.   MRN: [id number]  HPI Pt presents to clinic with for a discussion of her diagnosis from the rheumatologist.  She has started weekly methotrexate and feels like her stiffness in the am has improved slightly.  She is also wondering if she still needs to take her KCl because it is a horrible and large pill that she feels like get stuck in her throat.  She is still trying to get her weight loss surgery covered through her insurance.  She would like a temporary handicapped sticker for her car because she has a lot of pain walking from the parking lot and sometime is so tired she cannot continue her shopping.  Review of Systems     Objective:   Physical Exam  Vitals reviewed. Constitutional: She is oriented to person, place, and time. She appears well-developed and well-nourished.  HENT:  Head: Normocephalic and atraumatic.  Right Ear: External ear normal.  Left Ear: External ear normal.  Eyes: Conjunctivae are normal.  Neurological: She is alert and oriented to person, place, and time.  Skin: Skin is warm and dry.  Psychiatric: She has a normal mood and affect. Her behavior is normal. Judgment and thought content normal.       Assessment & Plan:  Hypokalemia - We will switch to dissolvable KCl to see if she is able to tolerate that better.  Plan: potassium bicarbonate (KLOR-CON/EF) 25 MEQ disintegrating tablet  Inflammatory arthritis - she will continue to see rheumatology for her pain.  Recheck with me in a month to recheck KCl and CMET and lipid unless she has f/u with cardiology.  She should also talk to cardiology about letter for insurance company for her weight loss surgery.  [name] PA-C  [facility] 10/04/2013 11:08 AM   Pt given an application for a temporary handicapped placard for 3 months due to arthritis condition.

## 2013-10-16 ENCOUNTER — Other Ambulatory Visit: Payer: Self-pay | Admitting: Physician Assistant

## 2013-10-16 ENCOUNTER — Ambulatory Visit (INDEPENDENT_AMBULATORY_CARE_PROVIDER_SITE_OTHER): Payer: BC Managed Care – PPO | Admitting: Cardiology

## 2013-10-16 VITALS — BP 136/84 | HR 64 | Ht 62.0 in | Wt 211.2 lb

## 2013-10-16 DIAGNOSIS — R0789 Other chest pain: Secondary | ICD-10-CM

## 2013-10-16 NOTE — Progress Notes (Signed)
Patient ID: [name], female   DOB: 28-Feb-1956, 58 y.o.   MRN: [id number]    10/16/2013 Chelby Salata   08-Nov-1955  [id number]  Primary Physicia GUEST, Loretha Stapler, MD Primary Cardiologist: Dr. Rennis Golden  HPI:  The patient is a 58 year old, moderately overweight white female, with a past medical history significant for hypertension, hyperlipidemia, newly diagnosed rheumatoid arthritis and a strong family history of CAD, who presented to [facility] on Sep 17, 2013 with a complaint of atypical chest pain and dyspnea on exertion. She ruled out for myocardial infarction with negative cardiac enzymes x3. Coronary CT was performed which demonstrated normal coronaries. D-dimer was also negative. It was determined that her chest pain was likely noncardiac in etiology. During her hospitalization, a lipid panel was also obtained which demonstrated a total cholesterol of 241 and LDL of 148. She was placed on low-dose Lipitor and instructed to followup with her PCP for continued monitoring. She was discharged home and an outpatient 2-D echocardiogram was also obtained to evaluate her dyspnea. This demonstrated both normal systolic and diastolic function. Left ventricular ejection fraction was estimated at 60-65%. There was no evidence of any valvular abnormalities and no pericardial effusion.  She presents back to clinic today for post hospital followup. She denies any further chest discomfort. She feels that this was either likely related to acid reflux or her rheumatoid arthritis. Her main complaint today is continued moderate to severe arthritic pain. She is frustrated because she was denied, by her insurance company, coverage for lap band surgery, in an effort to lose weight, as both her PCP and rheumatologist feel that this may help relieve the symptoms of her rheumatoid arthritis. She was informed that she is not considered a candidate at this time because her BMI is less than 40 and she does not have enough  comorbidities to warrant lap band surgery at this time. She hopes that Dr. Rennis Golden, who has been assigned as her primary cardiologist, will recommend that she be re-considered for the surgery given her obesity, HTN and newly diagnosed HLD.    Current Outpatient Prescriptions  Medication Sig Dispense Refill  . ALPRAZolam (XANAX) 0.5 MG tablet Take 1 tablet (0.5 mg total) by mouth at bedtime as needed for sleep.  30 tablet  3  . aspirin 81 MG tablet Take 81 mg by mouth daily.      Marland Kitchen atorvastatin (LIPITOR) 20 MG tablet Take 1 tablet (20 mg total) by mouth every evening.  30 tablet  1  . citalopram (CELEXA) 20 MG tablet Take 20 mg by mouth daily.      . hydrochlorothiazide (HYDRODIURIL) 25 MG tablet Take 25 mg by mouth daily.       Marland Kitchen loratadine (CLARITIN) 10 MG tablet Take 1 tablet (10 mg total) by mouth daily.  90 tablet  3  . Methotrexate Sodium, PF, 50 MG/2ML SOLN Inject 60 mg as directed once a week.      Marland Kitchen omeprazole (PRILOSEC) 20 MG capsule Take 1 capsule (20 mg total) by mouth daily. For possible acid reflux.  30 capsule  0  . potassium bicarbonate (KLOR-CON/EF) 25 MEQ disintegrating tablet Take 1 tablet (25 mEq total) by mouth daily.  30 tablet  3  . potassium chloride SA (K-DUR,KLOR-CON) 20 MEQ tablet Take 1 tablet (20 mEq total) by mouth daily.  30 tablet  1  . propranolol (INDERAL) 80 MG tablet Take 1 tablet (80 mg total) by mouth daily. PATIENT NEEDS OFFICE VISIT FOR ADDITIONAL REFILLS  30 tablet  0  . traMADol (ULTRAM) 50 MG tablet Take 1-2 tablets (50-100 mg total) by mouth at bedtime as needed.  60 tablet  0   No current facility-administered medications for this visit.    Allergies  Allergen Reactions  . Hydrocodone Itching    History   Social History  . Marital Status: Single    Spouse Name: N/A    Number of Children: N/A  . Years of Education: N/A   Occupational History  . Not on file.   Social History Main Topics  . Smoking status: Former Smoker    Types:  Cigarettes    Quit date: 05/03/1998  . Smokeless tobacco: Never Used  . Alcohol Use: No  . Drug Use: No  . Sexual Activity: Yes   Other Topics Concern  . Not on file   Social History Narrative  . No narrative on file     Review of Systems: General: negative for chills, fever, night sweats or weight changes.  Cardiovascular: negative for chest pain, dyspnea on exertion, edema, orthopnea, palpitations, paroxysmal nocturnal dyspnea or shortness of breath Dermatological: negative for rash Respiratory: negative for cough or wheezing Urologic: negative for hematuria Abdominal: negative for nausea, vomiting, diarrhea, bright red blood per rectum, melena, or hematemesis Neurologic: negative for visual changes, syncope, or dizziness All other systems reviewed and are otherwise negative except as noted above.    Blood pressure 136/84, pulse 64, height 5\' 2"  (1.575 m), weight 211 lb 3.2 oz (95.8 kg).  General appearance: alert, cooperative and no distress, moderately obese Neck: no carotid bruit and no JVD Lungs: clear to auscultation bilaterally Heart: regular rate and rhythm, S1, S2 normal, no murmur, click, rub or gallop Extremities: no LEE Pulses: 2+ and symmetric Skin: warm and dry Neurologic: Grossly normal  Body mass index is 38.62 kg/(m^2).   EKG Not performed  ASSESSMENT AND PLAN:   1. Noncardiac chest pain: Recent cardiac CT demonstrated normal coronaries. Left ventricular ejection fraction is normal at 60-65%. She has multiple risk factors for CAD including obesity, hypertension, hyperlipidemia and a strong family history of CAD. We discussed the importance of risk factor modification to prevent development of CAD further down the road.  2. Dyspnea on exertion: Recent 2-D echocardiogram demonstrated normal systolic and diastolic function. Left ventricular ejection fraction was estimated at 60-65%. There were no valvular abnormalities no pericardial effusion.  3.  Rheumatoid arthritis: followed by rheumatologist.   4. Obesity: BMI 38.6. Pt wishes to undergo lap band surgery, as it is difficult for her to lose weight through exercise due to her RA.  5. Hypertension: controlled continue current medications.   6. Hyperlipidemia: most recent LDL was 148. Goal is < 100. She is on Lipitor. Her PCP will manage.  PLAN  Continue current medications as prescribed. Follow with Dr. in 6 months for repeat assessment. She has asked if I would consult Dr. Rennis Golden regarding providing a recommendation for her insurance to reconsider her need for gastric bypass. However, I informed her that the request will likely need to come from her either her rheumatologist, who has recommended that she loose weight to help relieve her RA symptoms for by her PCP. However, I will pass the message to Dr. Rennis Golden.   SIMMONS, BRITTAINYPA-C 10/16/2013 9:42 AM

## 2013-10-16 NOTE — Patient Instructions (Signed)
Continue current medications as prescribed. We will notify Dr. Rennis Golden of your request for referral for consideration for lap band surgery. Recommend that you continue to followup with Dr. Rennis Golden in 6 months for repeat evaluation.

## 2013-10-17 ENCOUNTER — Encounter: Payer: Self-pay | Admitting: Cardiology

## 2013-10-19 ENCOUNTER — Telehealth: Payer: Self-pay

## 2013-10-19 NOTE — Telephone Encounter (Signed)
PATIENT CALLED AGAIN STATING SHE IS GOING OUT OF TOWN TOMORROW.

## 2013-10-19 NOTE — Telephone Encounter (Signed)
She can use Mucinex - and nasal saline spray - and Flonase OTC

## 2013-10-19 NOTE — Telephone Encounter (Signed)
Pt states she has been experiencing a runny nose. She has been taking Clartin daily. She would like to take an OTC to help clear up her nose. Please advise.

## 2013-10-19 NOTE — Telephone Encounter (Signed)
PATIENT IS ON CHEMO INJECTIONS. SHE NOW HAS A COLD AND IS NOT SURE WHAT SHE CAN TAKE THAT WILL NOT INTERFERE WITH HER INJECTIONS.

## 2013-10-21 ENCOUNTER — Telehealth: Payer: Self-pay | Admitting: Family Medicine

## 2013-10-21 NOTE — Telephone Encounter (Signed)
Pt does not have VM set up.

## 2013-10-21 NOTE — Telephone Encounter (Signed)
Message copied by Tilman Neat on Sun Oct 21, 2013  4:15 PM ------      Message from: Morrell Riddle      Created: Thu Oct 04, 2013 11:10 AM       1 month recheck ------

## 2013-10-22 ENCOUNTER — Encounter: Payer: Self-pay | Admitting: Cardiology

## 2013-10-22 NOTE — Telephone Encounter (Signed)
Unable to lm vm not set up 

## 2013-10-22 NOTE — Telephone Encounter (Signed)
Advised pt

## 2013-10-23 ENCOUNTER — Other Ambulatory Visit: Payer: Self-pay | Admitting: *Deleted

## 2013-10-23 DIAGNOSIS — J302 Other seasonal allergic rhinitis: Secondary | ICD-10-CM

## 2013-10-23 MED ORDER — LORATADINE 10 MG PO TABS: 10.0000 mg | ORAL_TABLET | Freq: Every day | ORAL | Status: DC

## 2013-10-24 NOTE — Progress Notes (Signed)
Spoke with patient and she will come to walkin clinic to see Huntley Dec in a month.

## 2013-10-29 ENCOUNTER — Telehealth: Payer: Self-pay | Admitting: Family Medicine

## 2013-10-29 NOTE — Telephone Encounter (Signed)
Pt will come to the walk in to see Maralyn Sago since she has no appointments until Oct.

## 2013-10-30 ENCOUNTER — Encounter: Payer: Self-pay | Admitting: Internal Medicine

## 2013-10-31 ENCOUNTER — Encounter: Payer: Self-pay | Admitting: *Deleted

## 2013-11-02 ENCOUNTER — Telehealth: Payer: Self-pay

## 2013-11-02 MED ORDER — TRAZODONE HCL 100 MG PO TABS: ORAL_TABLET | ORAL | Status: DC

## 2013-11-02 NOTE — Telephone Encounter (Signed)
Pharm sent fax stating that pt says correct sig on trazadone Rx is 1-2 tabs Qhs. Last note I see on it last Oct was to inc to 2 tabs Qhs, so I will correct the sig to allow for the increased dose.

## 2013-11-03 ENCOUNTER — Telehealth: Payer: Self-pay

## 2013-11-03 NOTE — Telephone Encounter (Signed)
Patient needs a refill on her blood pressure medication which she does not know the name of. [employer]  [phone]

## 2013-11-05 ENCOUNTER — Ambulatory Visit (INDEPENDENT_AMBULATORY_CARE_PROVIDER_SITE_OTHER): Payer: BC Managed Care – PPO | Admitting: Family Medicine

## 2013-11-05 VITALS — BP 130/80 | HR 82 | Temp 97.7°F | Resp 16 | Ht 62.0 in | Wt 210.0 lb

## 2013-11-05 DIAGNOSIS — G4700 Insomnia, unspecified: Secondary | ICD-10-CM

## 2013-11-05 DIAGNOSIS — F329 Major depressive disorder, single episode, unspecified: Secondary | ICD-10-CM

## 2013-11-05 DIAGNOSIS — F32A Depression, unspecified: Secondary | ICD-10-CM

## 2013-11-05 DIAGNOSIS — I10 Essential (primary) hypertension: Secondary | ICD-10-CM

## 2013-11-05 DIAGNOSIS — F3289 Other specified depressive episodes: Secondary | ICD-10-CM

## 2013-11-05 DIAGNOSIS — R112 Nausea with vomiting, unspecified: Secondary | ICD-10-CM

## 2013-11-05 DIAGNOSIS — M545 Low back pain, unspecified: Secondary | ICD-10-CM

## 2013-11-05 DIAGNOSIS — E7800 Pure hypercholesterolemia, unspecified: Secondary | ICD-10-CM

## 2013-11-05 DIAGNOSIS — M79604 Pain in right leg: Secondary | ICD-10-CM

## 2013-11-05 DIAGNOSIS — K219 Gastro-esophageal reflux disease without esophagitis: Secondary | ICD-10-CM

## 2013-11-05 DIAGNOSIS — M79643 Pain in unspecified hand: Secondary | ICD-10-CM

## 2013-11-05 DIAGNOSIS — M79609 Pain in unspecified limb: Secondary | ICD-10-CM

## 2013-11-05 MED ORDER — OMEPRAZOLE 20 MG PO CPDR: 20.0000 mg | DELAYED_RELEASE_CAPSULE | Freq: Every day | ORAL | Status: DC

## 2013-11-05 MED ORDER — TRAMADOL HCL 50 MG PO TABS: 50.0000 mg | ORAL_TABLET | Freq: Every evening | ORAL | Status: DC | PRN

## 2013-11-05 MED ORDER — ATORVASTATIN CALCIUM 20 MG PO TABS: 20.0000 mg | ORAL_TABLET | Freq: Every evening | ORAL | Status: DC

## 2013-11-05 MED ORDER — PROPRANOLOL HCL 80 MG PO TABS: ORAL_TABLET | ORAL | Status: DC

## 2013-11-05 MED ORDER — ONDANSETRON 4 MG PO TBDP
4.0000 mg | ORAL_TABLET | Freq: Three times a day (TID) | ORAL | Status: DC | PRN
Start: 2013-11-05 — End: 2013-11-26

## 2013-11-05 MED ORDER — TRAZODONE HCL 100 MG PO TABS: ORAL_TABLET | ORAL | Status: DC

## 2013-11-05 MED ORDER — CITALOPRAM HYDROBROMIDE 20 MG PO TABS: 20.0000 mg | ORAL_TABLET | Freq: Every day | ORAL | Status: DC

## 2013-11-05 MED ORDER — HYDROCHLOROTHIAZIDE 25 MG PO TABS: 25.0000 mg | ORAL_TABLET | Freq: Every day | ORAL | Status: DC

## 2013-11-05 NOTE — Telephone Encounter (Signed)
Pt came for an OV- medications refilled at this time.

## 2013-11-05 NOTE — Patient Instructions (Signed)
I will be in touch with your labs.  Your BP looks good today.  Ask your rheumatologist about getting the shingles vaccine to make sure this is safe for you while taking methotrexate

## 2013-11-05 NOTE — Progress Notes (Signed)
[facility] [address] [place] [address] [phone]  Date:  11/05/2013   Name:  [name]   DOB:  04-13-1956   MRN:  [id number]  PCP:  Tally Due, MD    Chief Complaint: Medication Refill   History of Present Illness:  [name] is a 58 y.o. very pleasant female patient who presents with the following:  Here today for a medication refill.   She was dx with RA just here recently.  She is getting methotrexate shots.  She is also using some zofran for nausea asociated with her weekly methotrexate shots.  She is getting these shots every Friday   Patient Active Problem List   Diagnosis Date Noted  . Hypokalemia 09/18/2013  . Family history of ischemic heart disease   . Chest pain at rest 09/17/2013  . Episode of dizziness after NTG while walking to BR 09/17/2013  . Chest pain 09/17/2013  . Inflammatory arthritis 09/12/2013  . Elevated cholesterol 04/11/2013  . Obesity, Class II, BMI 35-39.9, with comorbidity 03/14/2013  . Elevated liver enzymes 11/23/2012  . Generalized anxiety disorder 08/23/2012  . Insomnia 08/14/2012  . Pain in joint, lower leg 08/14/2012  . HTN (hypertension) 06/16/2012  . Migraine, unspecified, without mention of intractable migraine without mention of status migrainosus 06/16/2012    Past Medical History  Diagnosis Date  . Migraine   . Hypertension   . PONV (postoperative nausea and vomiting)     violent nausea/vomiting  . Hyperlipidemia   . Obesity   . Arthritis   . Family history of ischemic heart disease   . Atypical chest pain     a. normal coronary CT 09/18/2013    Past Surgical History  Procedure Laterality Date  . Back surgery    . Abdominal hysterectomy  2003  . Cholecystectomy N/A 06/27/2012    Procedure: LAPAROSCOPIC CHOLECYSTECTOMY WITH INTRAOPERATIVE CHOLANGIOGRAM;  Surgeon: Currie Paris, MD;  Location: [facility];  Service: General;  Laterality: N/A;  . Cesarean section    . Carpal tunnel  release      History  Substance Use Topics  . Smoking status: Former Smoker    Types: Cigarettes    Quit date: 05/03/1998  . Smokeless tobacco: Never Used  . Alcohol Use: No    Family History  Problem Relation Age of Onset  . Leukemia Father   . Cancer Father   . Diabetes Maternal Grandmother   . Heart disease Paternal Grandmother   . Colon cancer Paternal Aunt 40  . Rheum arthritis Mother   . Heart disease Mother     2 coronary stents  . Heart disease Maternal Uncle   . Heart disease Maternal Uncle     Allergies  Allergen Reactions  . Hydrocodone Itching    Medication list has been reviewed and updated.   Review of Systems:  As per HPI- otherwise negative.   Physical Examination: Filed Vitals:   11/05/13 1229  BP: 130/80  Pulse: 82  Temp: 97.7 F (36.5 C)  Resp: 16   Filed Vitals:   11/05/13 1229  Height: 5\' 2"  (1.575 m)  Weight: 210 lb (95.255 kg)   Body mass index is 38.4 kg/(m^2). Ideal Body Weight: Weight in (lb) to have BMI = 25: 136.4  GEN: WDWN, NAD, Non-toxic, A & O x 3, obese, looks well HEENT: Atraumatic, Normocephalic. Neck supple. No masses, No LAD. Ears and Nose: No external deformity. CV: RRR, No M/G/R. No JVD. No  thrill. No extra heart sounds. PULM: CTA B, no wheezes, crackles, rhonchi. No retractions. No resp. distress. No accessory muscle use.Marland Kitchen EXTR: No c/c/e NEURO Normal gait.  PSYCH: Normally interactive. Conversant. Not depressed or anxious appearing.  Calm demeanor.    Assessment and Plan: Lumbar pain with radiation down right leg - Plan: traMADol (ULTRAM) 50 MG tablet  Pain of hand, unspecified laterality - Plan: traMADol (ULTRAM) 50 MG tablet  High cholesterol - Plan: atorvastatin (LIPITOR) 20 MG tablet  Insomnia - Plan: traZODone (DESYREL) 100 MG tablet  Essential hypertension - Plan: hydrochlorothiazide (HYDRODIURIL) 25 MG tablet, propranolol (INDERAL) 80 MG tablet, Basic metabolic panel  Depression - Plan:  citalopram (CELEXA) 20 MG tablet  Gastroesophageal reflux disease, esophagitis presence not specified - Plan: omeprazole (PRILOSEC) 20 MG capsule  Non-intractable vomiting with nausea, vomiting of unspecified type - Plan: ondansetron (ZOFRAN ODT) 4 MG disintegrating tablet  Did refills for her today-she is already taking zofran, and needs more to use for nausea related to her methotrexate shots.  Await labs as above See patient instructions for more details.   Gave zostavax rx  Signed Abbe Amsterdam, MD

## 2013-11-06 LAB — BASIC METABOLIC PANEL
BUN: 14 mg/dL (ref 6–23)
CALCIUM: 9.4 mg/dL (ref 8.4–10.5)
CO2: 29 meq/L (ref 19–32)
CREATININE: 0.61 mg/dL (ref 0.50–1.10)
Chloride: 98 mEq/L (ref 96–112)
Glucose, Bld: 86 mg/dL (ref 70–99)
Potassium: 3.7 mEq/L (ref 3.5–5.3)
Sodium: 137 mEq/L (ref 135–145)

## 2013-11-20 ENCOUNTER — Other Ambulatory Visit: Payer: Self-pay | Admitting: Physician Assistant

## 2013-11-20 DIAGNOSIS — M1990 Unspecified osteoarthritis, unspecified site: Secondary | ICD-10-CM

## 2013-11-21 NOTE — Telephone Encounter (Signed)
Dr Patsy Lager, you just saw pt for med refills, but I don't see this on med list at OV. Sarah had written for 1 year of RFs last July. Do you want to give RFs?

## 2013-11-21 NOTE — Telephone Encounter (Signed)
Called her- her rheumatologist Dr. Dareen Piano actually already filled this for her

## 2013-11-26 ENCOUNTER — Other Ambulatory Visit: Payer: Self-pay | Admitting: Family Medicine

## 2014-01-17 ENCOUNTER — Telehealth: Payer: Self-pay

## 2014-01-17 NOTE — Telephone Encounter (Signed)
PATIENT WOULD LIKE THIS MESSAGE TO GO TO DR. Patsy Lager: SHE STATES SHE HAS RUN OUT OF HER CITALOPRAM 20 MG. SHE TOOK HER LAST ONE ON Saturday. WHEN SHE CALLED THE PHARMACY TO GET A REFILL ON IT, THEY TOLD HER THAT SHE SHOULD HAVE 30 PILLS LEFT BECAUSE IT WAS FILLED FOR #90 ON November 20, 2013. [name] SAID SHE THINKS THEY ONLY GAVE HER 60 PILLS AND NOT 90. SHE ONLY TAKES 1 PILL PER DAY. THE PHARMACY TOLD HER SHE COULD  NOT GET IT FILLED AGAIN UNTIL October. SHE HAS AN APPOINTMENT TO SEE DR. COPLAND ON Monday, HOWEVER, SHE CANNOT WAIT THAT LONG BECAUSE HER DEPRESSION IS VERY BAD. SHE NEEDS TO GET A REFILL AS SOON AS POSSIBLE. BEST PHONE [phone] (CELL)  PHARMACY CHOICE IS [facility].  MBC

## 2014-01-21 NOTE — Telephone Encounter (Signed)
Spoke to pharmacy and they are going to prepare a #30 day supply for her as it is possible they miscounted per the pharmacist. Pt advised.

## 2014-01-28 ENCOUNTER — Ambulatory Visit (INDEPENDENT_AMBULATORY_CARE_PROVIDER_SITE_OTHER): Payer: BC Managed Care – PPO | Admitting: Family Medicine

## 2014-01-28 ENCOUNTER — Encounter: Payer: Self-pay | Admitting: Family Medicine

## 2014-01-28 VITALS — BP 128/69 | HR 63 | Temp 98.1°F | Resp 16 | Ht 62.0 in | Wt 217.4 lb

## 2014-01-28 DIAGNOSIS — M546 Pain in thoracic spine: Secondary | ICD-10-CM

## 2014-01-28 DIAGNOSIS — Z23 Encounter for immunization: Secondary | ICD-10-CM

## 2014-01-28 DIAGNOSIS — M069 Rheumatoid arthritis, unspecified: Secondary | ICD-10-CM

## 2014-01-28 NOTE — Progress Notes (Signed)
[facility] [address] [place] [address] [phone]  Date:  01/28/2014   Name:  [name]   DOB:  1955-10-11   MRN:  [id number]  PCP:  Tally Due, MD    Chief Complaint: Back Pain and Arthritis   History of Present Illness:  [name] is a 57 y.o. very pleasant female patient who presents with the following:  She has a history of RA- she is being seen by Dr. Dareen Piano at [facility]  However she might like to see Elpidio Anis who is one of the PA's there.   She has been seen at [facility] for her back last week; she is going for her MRI results later on today.  They think she has a ruptured disc.    She has looked at possible bariatric surgery but has been told that she does not weigh enough.  She has continued to gain weight however.  Her back is stopping her from doing much at all.   Wt Readings from Last 3 Encounters:  01/28/14 217 lb 6.4 oz (98.612 kg)  11/05/13 210 lb (95.255 kg)  10/16/13 211 lb 3.2 oz (95.8 kg)     Patient Active Problem List   Diagnosis Date Noted  . Hypokalemia 09/18/2013  . Family history of ischemic heart disease   . Chest pain at rest 09/17/2013  . Episode of dizziness after NTG while walking to BR 09/17/2013  . Chest pain 09/17/2013  . Inflammatory arthritis 09/12/2013  . Elevated cholesterol 04/11/2013  . Obesity, Class II, BMI 35-39.9, with comorbidity 03/14/2013  . Elevated liver enzymes 11/23/2012  . Generalized anxiety disorder 08/23/2012  . Insomnia 08/14/2012  . Pain in joint, lower leg 08/14/2012  . HTN (hypertension) 06/16/2012  . Migraine, unspecified, without mention of intractable migraine without mention of status migrainosus 06/16/2012    Past Medical History  Diagnosis Date  . Migraine   . Hypertension   . PONV (postoperative nausea and vomiting)     violent nausea/vomiting  . Hyperlipidemia   . Obesity   . Arthritis   . Family history of ischemic heart disease   . Atypical chest pain     a.  normal coronary CT 09/18/2013    Past Surgical History  Procedure Laterality Date  . Back surgery    . Abdominal hysterectomy  2003  . Cholecystectomy N/A 06/27/2012    Procedure: LAPAROSCOPIC CHOLECYSTECTOMY WITH INTRAOPERATIVE CHOLANGIOGRAM;  Surgeon: Currie Paris, MD;  Location: [facility];  Service: General;  Laterality: N/A;  . Cesarean section    . Carpal tunnel release      History  Substance Use Topics  . Smoking status: Former Smoker    Types: Cigarettes    Quit date: 05/03/1998  . Smokeless tobacco: Never Used  . Alcohol Use: No    Family History  Problem Relation Age of Onset  . Leukemia Father   . Cancer Father   . Diabetes Maternal Grandmother   . Heart disease Paternal Grandmother   . Colon cancer Paternal Aunt 36  . Rheum arthritis Mother   . Heart disease Mother     2 coronary stents  . Heart disease Maternal Uncle   . Heart disease Maternal Uncle     Allergies  Allergen Reactions  . Hydrocodone Itching    Medication list has been reviewed and updated.  Current Outpatient Prescriptions on File Prior to Visit  Medication Sig Dispense Refill  . aspirin 81 MG tablet Take  81 mg by mouth daily.      Marland Kitchen atorvastatin (LIPITOR) 20 MG tablet Take 1 tablet (20 mg total) by mouth every evening.  90 tablet  3  . citalopram (CELEXA) 20 MG tablet Take 1 tablet (20 mg total) by mouth daily.  90 tablet  3  . hydrochlorothiazide (HYDRODIURIL) 25 MG tablet Take 1 tablet (25 mg total) by mouth daily.  90 tablet  3  . loratadine (CLARITIN) 10 MG tablet Take 1 tablet (10 mg total) by mouth daily.  90 tablet  3  . Methotrexate Sodium, PF, 50 MG/2ML SOLN Inject 60 mg as directed once a week.      Marland Kitchen omeprazole (PRILOSEC) 20 MG capsule Take 1 capsule (20 mg total) by mouth daily. For possible acid reflux.  30 capsule  3  . ondansetron (ZOFRAN-ODT) 4 MG disintegrating tablet DISSOLVE 1 TABLET ON TONGUE EVERY 8 HOURS AS NEEDED FOR NAUSEA OR VOMITING.  30 tablet  4  .  potassium bicarbonate (KLOR-CON/EF) 25 MEQ disintegrating tablet Take 1 tablet (25 mEq total) by mouth daily.  30 tablet  3  . propranolol (INDERAL) 80 MG tablet TAKE 1 TABLET BY MOUTH DAILY.  90 tablet  3  . traMADol (ULTRAM) 50 MG tablet Take 1-2 tablets (50-100 mg total) by mouth at bedtime as needed.  90 tablet  1  . traZODone (DESYREL) 100 MG tablet Take 1 -2 tablets every night as needed for sleep.  180 tablet  3   No current facility-administered medications on file prior to visit.    Review of Systems:  As per HPI- otherwise negative.   Physical Examination: Filed Vitals:   01/28/14 1202  BP: 128/69  Pulse: 63  Temp: 98.1 F (36.7 C)  Resp: 16   Filed Vitals:   01/28/14 1202  Height: 5\' 2"  (1.575 m)  Weight: 217 lb 6.4 oz (98.612 kg)   Body mass index is 39.75 kg/(m^2). Ideal Body Weight: Weight in (lb) to have BMI = 25: 136.4  GEN: WDWN, NAD, Non-toxic, A & O x 3, obese, looks well HEENT: Atraumatic, Normocephalic. Neck supple. No masses, No LAD. Ears and Nose: No external deformity. CV: RRR, No M/G/R. No JVD. No thrill. No extra heart sounds. PULM: CTA B, no wheezes, crackles, rhonchi. No retractions. No resp. distress. No accessory muscle use.  EXTR: No c/c/e NEURO Normal gait. PSYCH: Normally interactive. Conversant. Not depressed or anxious appearing.  Calm demeanor.    Assessment and Plan: Rheumatoid arthritis  Need for prophylactic vaccination and inoculation against influenza - Plan: Flu Vaccine QUAD 36+ mos IM  Thoracic back pain, unspecified back pain laterality   Flu shot today. She will try seeing Erin at her rheumatology office; if this does not work for her she will let me know.   Explained that she should proceed to her appt with ortho this afternoon to find out more about her MRI results.    She later returned around 4pm with a copy of her MRI- she was noted to have stenosis at L2-4, small disc herniation at L1-2.  She also told me that she  has noted difficulty with bladder control for about 2 weeks.  She is worried that this could indicate something more serious.  I agreed that she should alert her surgeon of this; she will return to [facility] and do this now.    Signed [facility], MD

## 2014-01-28 NOTE — Patient Instructions (Signed)
Please let me know if you need a referral to a different rheumatologist.  If you have time send me a mychart message about your back: I would really like to find out what is wrong

## 2014-02-19 ENCOUNTER — Encounter: Payer: Self-pay | Admitting: Physician Assistant

## 2014-02-19 DIAGNOSIS — M797 Fibromyalgia: Secondary | ICD-10-CM | POA: Insufficient documentation

## 2014-02-23 ENCOUNTER — Encounter (HOSPITAL_BASED_OUTPATIENT_CLINIC_OR_DEPARTMENT_OTHER): Payer: Self-pay | Admitting: Emergency Medicine

## 2014-02-23 ENCOUNTER — Emergency Department (HOSPITAL_BASED_OUTPATIENT_CLINIC_OR_DEPARTMENT_OTHER)
Admission: EM | Admit: 2014-02-23 | Discharge: 2014-02-23 | Disposition: A | Discharge status: Home / Self Care | Payer: BC Managed Care – PPO | Attending: Emergency Medicine | Admitting: Emergency Medicine

## 2014-02-23 DIAGNOSIS — I10 Essential (primary) hypertension: Secondary | ICD-10-CM | POA: Diagnosis not present

## 2014-02-23 DIAGNOSIS — R197 Diarrhea, unspecified: Secondary | ICD-10-CM | POA: Diagnosis not present

## 2014-02-23 DIAGNOSIS — R112 Nausea with vomiting, unspecified: Secondary | ICD-10-CM | POA: Diagnosis not present

## 2014-02-23 DIAGNOSIS — Z7982 Long term (current) use of aspirin: Secondary | ICD-10-CM | POA: Diagnosis not present

## 2014-02-23 DIAGNOSIS — M5440 Lumbago with sciatica, unspecified side: Secondary | ICD-10-CM | POA: Diagnosis not present

## 2014-02-23 DIAGNOSIS — Z791 Long term (current) use of non-steroidal anti-inflammatories (NSAID): Secondary | ICD-10-CM | POA: Insufficient documentation

## 2014-02-23 DIAGNOSIS — E669 Obesity, unspecified: Secondary | ICD-10-CM | POA: Diagnosis not present

## 2014-02-23 DIAGNOSIS — M549 Dorsalgia, unspecified: Secondary | ICD-10-CM | POA: Diagnosis present

## 2014-02-23 DIAGNOSIS — M1990 Unspecified osteoarthritis, unspecified site: Secondary | ICD-10-CM | POA: Diagnosis not present

## 2014-02-23 DIAGNOSIS — Z79899 Other long term (current) drug therapy: Secondary | ICD-10-CM | POA: Diagnosis not present

## 2014-02-23 DIAGNOSIS — Z87891 Personal history of nicotine dependence: Secondary | ICD-10-CM | POA: Diagnosis not present

## 2014-02-23 MED ORDER — PREDNISONE 10 MG PO TABS: 20.0000 mg | ORAL_TABLET | Freq: Two times a day (BID) | ORAL | Status: DC

## 2014-02-23 MED ORDER — DEXAMETHASONE SODIUM PHOSPHATE 10 MG/ML IJ SOLN
10.0000 mg | Freq: Once | INTRAMUSCULAR | Status: AC
  Administered 2014-02-23: 10 mg via INTRAMUSCULAR
  Filled 2014-02-23: qty 1

## 2014-02-23 MED ORDER — HYDROMORPHONE HCL 1 MG/ML IJ SOLN
2.0000 mg | Freq: Once | INTRAMUSCULAR | Status: AC
  Administered 2014-02-23: 2 mg via INTRAMUSCULAR
  Filled 2014-02-23: qty 2

## 2014-02-23 MED ORDER — OXYCODONE-ACETAMINOPHEN 5-325 MG PO TABS: 2.0000 | ORAL_TABLET | ORAL | Status: DC | PRN

## 2014-02-23 MED ORDER — CYCLOBENZAPRINE HCL 10 MG PO TABS
10.0000 mg | ORAL_TABLET | Freq: Once | ORAL | Status: AC
  Administered 2014-02-23: 10 mg via ORAL
  Filled 2014-02-23: qty 1

## 2014-02-23 MED ORDER — CYCLOBENZAPRINE HCL 10 MG PO TABS: 10.0000 mg | ORAL_TABLET | Freq: Three times a day (TID) | ORAL | Status: DC | PRN

## 2014-02-23 NOTE — ED Notes (Addendum)
Patient state she is having back pain, started 3 months ago. States that for the past few days she has been "loosing control of her bowels". She is supposed to see a neurologist but has not been set up with an appt yet. Took 2 percocet without relief PTA.

## 2014-02-23 NOTE — Discharge Instructions (Signed)
Prednisone as prescribed.  Percocet and Flexeril as prescribed as needed for pain.  Follow-up with your neurosurgeon next week.   Back Pain, Adult Back pain is very common. The pain often gets better over time. The cause of back pain is usually not dangerous. Most people can learn to manage their back pain on their own.  HOME CARE   Stay active. Start with short walks on flat ground if you can. Try to walk farther each day.  Do not sit, drive, or stand in one place for more than 30 minutes. Do not stay in bed.  Do not avoid exercise or work. Activity can help your back heal faster.  Be careful when you bend or lift an object. Bend at your knees, keep the object close to you, and do not twist.  Sleep on a firm mattress. Lie on your side, and bend your knees. If you lie on your back, put a pillow under your knees.  Only take medicines as told by your doctor.  Put ice on the injured area.  Put ice in a plastic bag.  Place a towel between your skin and the bag.  Leave the ice on for 15-20 minutes, 03-04 times a day for the first 2 to 3 days. After that, you can switch between ice and heat packs.  Ask your doctor about back exercises or massage.  Avoid feeling anxious or stressed. Find good ways to deal with stress, such as exercise. GET HELP RIGHT AWAY IF:   Your pain does not go away with rest or medicine.  Your pain does not go away in 1 week.  You have new problems.  You do not feel well.  The pain spreads into your legs.  You cannot control when you poop (bowel movement) or pee (urinate).  Your arms or legs feel weak or lose feeling (numbness).  You feel sick to your stomach (nauseous) or throw up (vomit).  You have belly (abdominal) pain.  You feel like you may pass out (faint). MAKE SURE YOU:   Understand these instructions.  Will watch your condition.  Will get help right away if you are not doing well or get worse. Document Released: 10/06/2007  Document Revised: 07/12/2011 Document Reviewed: 08/21/2013 [facility] Patient Information 2015 [place], [place]. This information is not intended to replace advice given to you by your health care provider. Make sure you discuss any questions you have with your health care provider.

## 2014-02-23 NOTE — ED Provider Notes (Signed)
CSN: [id number]     Arrival date & time 02/23/14  1558 History  This chart was scribed for Geoffery Lyons, MD by Richarda Overlie, ED Scribe. This patient was seen in room MH03/MH03 and the patient's care was started 4:34 PM.    Chief Complaint  Patient presents with  . Back Pain    Patient is a 58 y.o. female presenting with back pain. The history is provided by the patient. No language interpreter was used.  Back Pain Radiates to:  R thigh, L thigh, L knee, R knee, L foot and R foot Pain severity:  Moderate Onset quality:  Gradual Duration:  3 months Timing:  Constant Progression:  Waxing and waning Relieved by:  Nothing Ineffective treatments:  Narcotics Associated symptoms: bladder incontinence, leg pain and numbness   Associated symptoms: no fever    HPI Comments: Coretha Creswell is a 58 y.o. female who presents to the Emergency Department complaining of constant back pain that radiates down her legs that started 3 months ago. She describes the pain as dull, achy and a 9/10 currently. Pt reports she had a MRI 3 weeks ago that determined she had spinal stenosis. Her orthopedist administered cortisone shots and epidural treatments but she reports everything has failed to relieve her symptoms. She reports associated nausea and vomiting as symptoms. She reports she had bowel and bladder incontinence yesterday and earlier today as an associated symptom.She states her Percocet prescription has failed to relieve her symptoms as well. Pt says she has been trying to see her neurologist but has not been able to see them.  Pt took 2 percocet without relief PTA. She denies fever.    Past Medical History  Diagnosis Date  . Migraine   . Hypertension   . PONV (postoperative nausea and vomiting)     violent nausea/vomiting  . Hyperlipidemia   . Obesity   . Family history of ischemic heart disease   . Atypical chest pain     a. normal coronary CT 09/18/2013  . Arthritis    Past Surgical History   Procedure Laterality Date  . Back surgery    . Abdominal hysterectomy  2003  . Cholecystectomy N/A 06/27/2012    Procedure: LAPAROSCOPIC CHOLECYSTECTOMY WITH INTRAOPERATIVE CHOLANGIOGRAM;  Surgeon: Currie Paris, MD;  Location: [facility];  Service: General;  Laterality: N/A;  . Cesarean section    . Carpal tunnel release     Family History  Problem Relation Age of Onset  . Leukemia Father   . Cancer Father   . Diabetes Maternal Grandmother   . Heart disease Paternal Grandmother   . Colon cancer Paternal Aunt 55  . Rheum arthritis Mother   . Heart disease Mother     2 coronary stents  . Heart disease Maternal Uncle   . Heart disease Maternal Uncle    History  Substance Use Topics  . Smoking status: Former Smoker    Types: Cigarettes    Quit date: 05/03/1998  . Smokeless tobacco: Never Used  . Alcohol Use: No   OB History   Grav Para Term Preterm Abortions TAB SAB Ect Mult Living                 Review of Systems  Constitutional: Negative for fever.  Gastrointestinal: Positive for nausea, vomiting and diarrhea. Negative for blood in stool.  Genitourinary: Positive for bladder incontinence.  Musculoskeletal: Positive for back pain and myalgias.  Neurological: Positive for numbness.  All other systems reviewed and are  negative.   Allergies  Hydrocodone  Home Medications   Prior to Admission medications   Medication Sig Start Date End Date Taking? Authorizing Provider  oxycodone-acetaminophen (PERCOCET) 2.5-325 MG per tablet Take 2 tablets by mouth every 4 (four) hours as needed for pain.   Yes Historical Provider, MD  aspirin 81 MG tablet Take 81 mg by mouth daily.    Historical Provider, MD  atorvastatin (LIPITOR) 20 MG tablet Take 1 tablet (20 mg total) by mouth every evening. 11/05/13   Gwenlyn Found Copland, MD  citalopram (CELEXA) 20 MG tablet Take 1 tablet (20 mg total) by mouth daily. 11/05/13   Gwenlyn Found Copland, MD  gabapentin (NEURONTIN) 300 MG capsule Take 300  mg by mouth 2 (two) times daily.    Historical Provider, MD  hydrochlorothiazide (HYDRODIURIL) 25 MG tablet Take 1 tablet (25 mg total) by mouth daily. 11/05/13   Gwenlyn Found Copland, MD  loratadine (CLARITIN) 10 MG tablet Take 1 tablet (10 mg total) by mouth daily. 10/23/13   Nelva Nay, PA-C  Methotrexate Sodium, PF, 50 MG/2ML SOLN Inject 60 mg as directed once a week.    Historical Provider, MD  naproxen (NAPROSYN) 500 MG tablet Take 500 mg by mouth 2 (two) times daily with a meal.    Historical Provider, MD  omeprazole (PRILOSEC) 20 MG capsule Take 1 capsule (20 mg total) by mouth daily. For possible acid reflux. 11/05/13   Gwenlyn Found Copland, MD  ondansetron (ZOFRAN-ODT) 4 MG disintegrating tablet DISSOLVE 1 TABLET ON TONGUE EVERY 8 HOURS AS NEEDED FOR NAUSEA OR VOMITING.    Gwenlyn Found Copland, MD  potassium bicarbonate (KLOR-CON/EF) 25 MEQ disintegrating tablet Take 1 tablet (25 mEq total) by mouth daily. 10/04/13   Morrell Riddle, PA-C  propranolol (INDERAL) 80 MG tablet TAKE 1 TABLET BY MOUTH DAILY. 11/05/13   Gwenlyn Found Copland, MD  traMADol (ULTRAM) 50 MG tablet Take 1-2 tablets (50-100 mg total) by mouth at bedtime as needed. 11/05/13   Pearline Cables, MD  traZODone (DESYREL) 100 MG tablet Take 1 -2 tablets every night as needed for sleep. 11/05/13   Gwenlyn Found Copland, MD   BP 114/49  Pulse 65  Temp(Src) 98.1 F (36.7 C) (Oral)  Resp 20  Ht 5\' 2"  (1.575 m)  Wt 210 lb (95.255 kg)  BMI 38.40 kg/m2  SpO2 96%  Physical Exam  Nursing note and vitals reviewed. Constitutional: She is oriented to person, place, and time. She appears well-developed and well-nourished.  HENT:  Head: Normocephalic and atraumatic.  Eyes: EOM are normal. Pupils are equal, round, and reactive to light.  Neck: Normal range of motion. Neck supple.  Cardiovascular: Normal rate, regular rhythm and normal heart sounds.   Pulmonary/Chest: Effort normal and breath sounds normal.  Abdominal: Soft. Bowel sounds are normal.  She exhibits no distension.  Musculoskeletal:  Tenderness to palpation in the soft tissues of the lumbar region.   Neurological: She is alert and oriented to person, place, and time. No cranial nerve deficit. She exhibits normal muscle tone. Coordination normal.  DTRs are 2+ and symmetrical in the bilateral lower extremities. Strength is 5/5 in the bilateral lower extremities. Able to ambulate without difficulty.   Skin: Skin is warm and dry.  Psychiatric: She has a normal mood and affect.    ED Course  Procedures DIAGNOSTIC STUDIES: Oxygen Saturation is 96% on RA, normal by my interpretation.    COORDINATION OF CARE: 4:47 PM Discussed treatment plan with pt at  bedside and pt agreed to plan.   Labs Review Labs Reviewed - No data to display  Imaging Review No results found.   EKG Interpretation None      MDM   Final diagnoses:  None    Patient presents with complaints of low back pain . She has a history of spinal stenosis and bulging disks. She had an MRI that showed this approximately 2 weeks ago. She has been seen by neurosurgery and has had a steroid injection which has not helped. She presents today because she was "tired of the pain". She was given an IM injection of Dilaudid along with Flexeril and Decadron and is now feeling significantly better. She will be discharged with Percocet, prednisone, Flexeril, and follow up with neurosurgery. Her reflexes and strength are symmetrical, there are no bowel or bladder complaints. And I do not feel as though there is an indication for emergent imaging.  I personally performed the services described in this documentation, which was scribed in my presence. The recorded information has been reviewed and is accurate.       Geoffery Lyons, MD 02/23/14 [phone]

## 2014-03-12 ENCOUNTER — Other Ambulatory Visit: Payer: Self-pay | Admitting: Family Medicine

## 2014-03-15 ENCOUNTER — Other Ambulatory Visit: Payer: Self-pay | Admitting: Neurosurgery

## 2014-03-15 DIAGNOSIS — M4807 Spinal stenosis, lumbosacral region: Secondary | ICD-10-CM

## 2014-03-18 ENCOUNTER — Ambulatory Visit (INDEPENDENT_AMBULATORY_CARE_PROVIDER_SITE_OTHER): Payer: Self-pay

## 2014-03-18 ENCOUNTER — Ambulatory Visit (INDEPENDENT_AMBULATORY_CARE_PROVIDER_SITE_OTHER): Payer: BC Managed Care – PPO | Admitting: Neurology

## 2014-03-18 DIAGNOSIS — M5441 Lumbago with sciatica, right side: Secondary | ICD-10-CM

## 2014-03-18 DIAGNOSIS — Z0289 Encounter for other administrative examinations: Secondary | ICD-10-CM

## 2014-03-18 NOTE — Progress Notes (Signed)
  [facility]    Provider:  Dr Lucia Gaskins Referring Provider: Jonita Albee, MD Primary Care Physician:  Tally Due, MD  History:  [name] is a 58 y.o. female here as a referral from Dr. Perrin Maltese for low back pain and radicular symptoms. She reports LBP with radiation into the right buttocks, the back of the thigh and lateral aspect of the right leg. Been going on since June, worsening. Was in the ED 3 weeks ago because the pain was so bad. Worse when walking. Leaning forward helps. Hurts with coughing and valsalva. Focused lower extremity  exam demonstrates right weakness of hip flexion, hamstring and dorsiflexion. Absent achilles DTRs. Decrease to light touch lateral aspect of right leg.   Summary: Nerve conduction studies were performed on the bilateral lower extremities.  Bilateral Peroneal and Tibial motor conductions were within normal limits with normal F Wave latencies.  Bilateral Sural and Peroneal sensory conductions were within normal limits Bilateral H Reflexes were within normal limits  EMG needle study was performed on selected right lower extremity muscles and right paraspinal muscles. The right L5 paraspinal muscle showed moderately increased spontaneous activity. The right  Gluteus Maximus muscle showed reduced motor unit recruitment. The right Vastus Medialis, right Anterior Tibialis, right Medial Gastrocnemius, right Biceps Femoris (long and short heads), right Gluteus Medius muscles were within normal limits.    Conclusion: This is an abnormal study.  The chronic neurogenic changes seen in a right proximal leg muscle that is supplied by the L5/S1 nerve roots in conjunction with right lower lumbar paraspinal spontaneous activity and normal sensory conductions suggest a right L5/S1 radiculopathy.  Clinical correlation recommended.   Naomie Dean, MD  [facility] [address] [place], [place] [address]  Phone  [phone] Fax [phone]

## 2014-03-22 ENCOUNTER — Encounter (HOSPITAL_BASED_OUTPATIENT_CLINIC_OR_DEPARTMENT_OTHER): Payer: Self-pay | Admitting: *Deleted

## 2014-03-22 ENCOUNTER — Emergency Department (HOSPITAL_BASED_OUTPATIENT_CLINIC_OR_DEPARTMENT_OTHER)
Admission: EM | Admit: 2014-03-22 | Discharge: 2014-03-22 | Disposition: A | Discharge status: Home / Self Care | Payer: BC Managed Care – PPO | Attending: Emergency Medicine | Admitting: Emergency Medicine

## 2014-03-22 ENCOUNTER — Ambulatory Visit (INDEPENDENT_AMBULATORY_CARE_PROVIDER_SITE_OTHER): Payer: BC Managed Care – PPO | Admitting: Emergency Medicine

## 2014-03-22 VITALS — BP 140/85 | HR 63 | Temp 98.1°F | Resp 18 | Ht 64.0 in | Wt 218.0 lb

## 2014-03-22 DIAGNOSIS — Z791 Long term (current) use of non-steroidal anti-inflammatories (NSAID): Secondary | ICD-10-CM | POA: Diagnosis not present

## 2014-03-22 DIAGNOSIS — Z7982 Long term (current) use of aspirin: Secondary | ICD-10-CM | POA: Diagnosis not present

## 2014-03-22 DIAGNOSIS — E669 Obesity, unspecified: Secondary | ICD-10-CM | POA: Diagnosis not present

## 2014-03-22 DIAGNOSIS — M1990 Unspecified osteoarthritis, unspecified site: Secondary | ICD-10-CM | POA: Diagnosis not present

## 2014-03-22 DIAGNOSIS — Z9889 Other specified postprocedural states: Secondary | ICD-10-CM | POA: Insufficient documentation

## 2014-03-22 DIAGNOSIS — M5416 Radiculopathy, lumbar region: Secondary | ICD-10-CM

## 2014-03-22 DIAGNOSIS — Z87891 Personal history of nicotine dependence: Secondary | ICD-10-CM | POA: Diagnosis not present

## 2014-03-22 DIAGNOSIS — G43909 Migraine, unspecified, not intractable, without status migrainosus: Secondary | ICD-10-CM | POA: Insufficient documentation

## 2014-03-22 DIAGNOSIS — M545 Low back pain, unspecified: Secondary | ICD-10-CM

## 2014-03-22 DIAGNOSIS — I10 Essential (primary) hypertension: Secondary | ICD-10-CM | POA: Diagnosis not present

## 2014-03-22 DIAGNOSIS — E785 Hyperlipidemia, unspecified: Secondary | ICD-10-CM | POA: Insufficient documentation

## 2014-03-22 DIAGNOSIS — Z7952 Long term (current) use of systemic steroids: Secondary | ICD-10-CM | POA: Diagnosis not present

## 2014-03-22 MED ORDER — DEXAMETHASONE SODIUM PHOSPHATE 10 MG/ML IJ SOLN
10.0000 mg | Freq: Once | INTRAMUSCULAR | Status: AC
  Administered 2014-03-22: 10 mg via INTRAMUSCULAR
  Filled 2014-03-22: qty 1

## 2014-03-22 MED ORDER — HYDROMORPHONE HCL 1 MG/ML IJ SOLN
2.0000 mg | Freq: Once | INTRAMUSCULAR | Status: AC
  Administered 2014-03-22: 2 mg via INTRAMUSCULAR
  Filled 2014-03-22: qty 2

## 2014-03-22 MED ORDER — PREDNISONE 10 MG PO KIT: PACK | ORAL | Status: DC

## 2014-03-22 MED ORDER — OXYCODONE-ACETAMINOPHEN 5-325 MG PO TABS: 1.0000 | ORAL_TABLET | ORAL | Status: DC | PRN

## 2014-03-22 MED ORDER — PREDNISONE 20 MG PO TABS: ORAL_TABLET | ORAL | Status: DC

## 2014-03-22 NOTE — Progress Notes (Addendum)
[facility] [address] [place] [address] [phone]  Date:  03/22/2014   Name:  [name]   DOB:  02/05/1956   MRN:  [id number]  PCP:  Tally Due, MD    Chief Complaint: Back Pain   History of Present Illness:  Modestine Scherzinger is a 58 y.o. very pleasant female patient who presents with the following:  Has pain in low back with right sciatic neuritis.   Failed epidural steroid.  Won't consider another injection. Unable to view her MRI on EPIC but a review in the ER record describes bulging discs and spinal stenosis Had a NCS/EMG significant for L5-S1 right impingement. No pain relief on current regimen  She is refusing to take percocet she has on hand No improvement with over the counter medications or other home remedies. Denies other complaint or health concern today.   Patient Active Problem List   Diagnosis Date Noted  . Fibromyalgia 02/19/2014  . Hypokalemia 09/18/2013  . Family history of ischemic heart disease   . Chest pain at rest 09/17/2013  . Episode of dizziness after NTG while walking to [place] 09/17/2013  . Chest pain 09/17/2013  . Inflammatory arthritis 09/12/2013  . Elevated cholesterol 04/11/2013  . Obesity, Class II, BMI 35-39.9, with comorbidity 03/14/2013  . Elevated liver enzymes 11/23/2012  . Generalized anxiety disorder 08/23/2012  . Insomnia 08/14/2012  . Pain in joint, lower leg 08/14/2012  . HTN (hypertension) 06/16/2012  . Migraine, unspecified, without mention of intractable migraine without mention of status migrainosus 06/16/2012    Past Medical History  Diagnosis Date  . Migraine   . Hypertension   . PONV (postoperative nausea and vomiting)     violent nausea/vomiting  . Hyperlipidemia   . Obesity   . Family history of ischemic heart disease   . Atypical chest pain     a. normal coronary CT 09/18/2013  . Arthritis     Past Surgical History  Procedure Laterality Date  . Back surgery    . Abdominal  hysterectomy  2003  . Cholecystectomy N/A 06/27/2012    Procedure: LAPAROSCOPIC CHOLECYSTECTOMY WITH INTRAOPERATIVE CHOLANGIOGRAM;  Surgeon: Currie Paris, MD;  Location: [facility];  Service: General;  Laterality: N/A;  . Cesarean section    . Carpal tunnel release      History  Substance Use Topics  . Smoking status: Former Smoker    Types: Cigarettes    Quit date: 05/03/1998  . Smokeless tobacco: Never Used  . Alcohol Use: No    Family History  Problem Relation Age of Onset  . Leukemia Father   . Cancer Father   . Diabetes Maternal Grandmother   . Heart disease Paternal Grandmother   . Colon cancer Paternal Aunt 18  . Rheum arthritis Mother   . Heart disease Mother     2 coronary stents  . Heart disease Maternal Uncle   . Heart disease Maternal Uncle     Allergies  Allergen Reactions  . Hydrocodone Itching    Medication list has been reviewed and updated.  Current Outpatient Prescriptions on File Prior to Visit  Medication Sig Dispense Refill  . aspirin 81 MG tablet Take 81 mg by mouth daily.    Marland Kitchen atorvastatin (LIPITOR) 20 MG tablet Take 1 tablet (20 mg total) by mouth every evening. 90 tablet 3  . citalopram (CELEXA) 20 MG tablet Take 1 tablet (20 mg total) by mouth daily. 90 tablet 3  . gabapentin (  NEURONTIN) 300 MG capsule Take 300 mg by mouth 2 (two) times daily.    . hydrochlorothiazide (HYDRODIURIL) 25 MG tablet Take 1 tablet (25 mg total) by mouth daily. 90 tablet 3  . loratadine (CLARITIN) 10 MG tablet Take 1 tablet (10 mg total) by mouth daily. 90 tablet 3  . Methotrexate Sodium, PF, 50 MG/2ML SOLN Inject 60 mg as directed once a week.    . naproxen (NAPROSYN) 500 MG tablet Take 500 mg by mouth 2 (two) times daily with a meal.    . omeprazole (PRILOSEC) 20 MG capsule TAKE 1 CAPSULE BY MOUTH DAILY FOR POSSIBLE ACID REFLUX. 30 capsule 1  . ondansetron (ZOFRAN-ODT) 4 MG disintegrating tablet DISSOLVE 1 TABLET ON TONGUE EVERY 8 HOURS AS NEEDED FOR NAUSEA OR  VOMITING. 30 tablet 4  . oxycodone-acetaminophen (PERCOCET) 2.5-325 MG per tablet Take 2 tablets by mouth every 4 (four) hours as needed for pain.    Marland Kitchen oxyCODONE-acetaminophen (PERCOCET) 5-325 MG per tablet Take 2 tablets by mouth every 4 (four) hours as needed. 20 tablet 0  . potassium bicarbonate (KLOR-CON/EF) 25 MEQ disintegrating tablet Take 1 tablet (25 mEq total) by mouth daily. 30 tablet 3  . predniSONE (DELTASONE) 10 MG tablet Take 2 tablets (20 mg total) by mouth 2 (two) times daily. 20 tablet 0  . propranolol (INDERAL) 80 MG tablet TAKE 1 TABLET BY MOUTH DAILY. 90 tablet 3  . traMADol (ULTRAM) 50 MG tablet Take 1-2 tablets (50-100 mg total) by mouth at bedtime as needed. 90 tablet 1  . cyclobenzaprine (FLEXERIL) 10 MG tablet Take 1 tablet (10 mg total) by mouth 3 (three) times daily as needed for muscle spasms. 15 tablet 0  . traZODone (DESYREL) 100 MG tablet Take 1 -2 tablets every night as needed for sleep. 180 tablet 3   No current facility-administered medications on file prior to visit.    Review of Systems:  As per HPI, otherwise negative.    Physical Examination: Filed Vitals:   03/22/14 1626  BP: 140/85  Pulse: 63  Temp: 98.1 F (36.7 C)  Resp: 18   Filed Vitals:   03/22/14 1626  Height: 5\' 4"  (1.626 m)  Weight: 218 lb (98.884 kg)   Body mass index is 37.4 kg/(m^2). Ideal Body Weight: Weight in (lb) to have BMI = 25: 145.3   GEN: WDWN, NAD, Non-toxic, Alert & Oriented x 3 HEENT: Atraumatic, Normocephalic.  Ears and Nose: No external deformity. EXTR: No clubbing/cyanosis/edema NEURO: Normal gait.  PSYCH: Normally interactive. Conversant. Seems depressed and very anxious.    Assessment and Plan: Sciatic neuritis Percocet Prednisone Follow up with neurosurgery   Signed,  , MD   I spent at least 20 minutes reviewing her records and discussing treatment options.   She insisted that she be given the "same cocktail" of medication she  received in the ER.  I reminded her that we do not stock dilaudid injection in the office nor do we have dexamethasone.  She was insistent that those two medications made her pain free for "two weeks" and refused to take percocet as it made her "stupid' but was fine taking flexeril.  She left the office frustrated and in tears saying she would not take the percocet and saying she had sufficient flexeril

## 2014-03-22 NOTE — ED Notes (Signed)
Pt. Reports the lower back pain started in June and she reports she was told by her Neuro to come to the ED.  Pt. Said she is here due to pain because she is having back pain.  Pt. Reports most pain is in her R lower back and R leg and also causing her to lose control of bladder and bowel.

## 2014-03-22 NOTE — Patient Instructions (Signed)
Sciatica Sciatica is pain, weakness, numbness, or tingling along the path of the sciatic nerve. The nerve starts in the lower back and runs down the back of each leg. The nerve controls the muscles in the lower leg and in the back of the knee, while also providing sensation to the back of the thigh, lower leg, and the sole of your foot. Sciatica is a symptom of another medical condition. For instance, nerve damage or certain conditions, such as a herniated disk or bone spur on the spine, pinch or put pressure on the sciatic nerve. This causes the pain, weakness, or other sensations normally associated with sciatica. Generally, sciatica only affects one side of the body. CAUSES   Herniated or slipped disc.  Degenerative disk disease.  A pain disorder involving the narrow muscle in the buttocks (piriformis syndrome).  Pelvic injury or fracture.  Pregnancy.  Tumor (rare). SYMPTOMS  Symptoms can vary from mild to very severe. The symptoms usually travel from the low back to the buttocks and down the back of the leg. Symptoms can include:  Mild tingling or dull aches in the lower back, leg, or hip.  Numbness in the back of the calf or sole of the foot.  Burning sensations in the lower back, leg, or hip.  Sharp pains in the lower back, leg, or hip.  Leg weakness.  Severe back pain inhibiting movement. These symptoms may get worse with coughing, sneezing, laughing, or prolonged sitting or standing. Also, being overweight may worsen symptoms. DIAGNOSIS  Your caregiver will perform a physical exam to look for common symptoms of sciatica. He or she may ask you to do certain movements or activities that would trigger sciatic nerve pain. Other tests may be performed to find the cause of the sciatica. These may include:  Blood tests.  X-rays.  Imaging tests, such as an MRI or CT scan. TREATMENT  Treatment is directed at the cause of the sciatic pain. Sometimes, treatment is not necessary  and the pain and discomfort goes away on its own. If treatment is needed, your caregiver may suggest:  Over-the-counter medicines to relieve pain.  Prescription medicines, such as anti-inflammatory medicine, muscle relaxants, or narcotics.  Applying heat or ice to the painful area.  Steroid injections to lessen pain, irritation, and inflammation around the nerve.  Reducing activity during periods of pain.  Exercising and stretching to strengthen your abdomen and improve flexibility of your spine. Your caregiver may suggest losing weight if the extra weight makes the back pain worse.  Physical therapy.  Surgery to eliminate what is pressing or pinching the nerve, such as a bone spur or part of a herniated disk. HOME CARE INSTRUCTIONS   Only take over-the-counter or prescription medicines for pain or discomfort as directed by your caregiver.  Apply ice to the affected area for 20 minutes, 3-4 times a day for the first 48-72 hours. Then try heat in the same way.  Exercise, stretch, or perform your usual activities if these do not aggravate your pain.  Attend physical therapy sessions as directed by your caregiver.  Keep all follow-up appointments as directed by your caregiver.  Do not wear high heels or shoes that do not provide proper support.  Check your mattress to see if it is too soft. A firm mattress may lessen your pain and discomfort. SEEK IMMEDIATE MEDICAL CARE IF:   You lose control of your bowel or bladder (incontinence).  You have increasing weakness in the lower back, pelvis, buttocks,   or legs.  You have redness or swelling of your back.  You have a burning sensation when you urinate.  You have pain that gets worse when you lie down or awakens you at night.  Your pain is worse than you have experienced in the past.  Your pain is lasting longer than 4 weeks.  You are suddenly losing weight without reason. MAKE SURE YOU:  Understand these  instructions.  Will watch your condition.  Will get help right away if you are not doing well or get worse. Document Released: 04/13/2001 Document Revised: 10/19/2011 Document Reviewed: 08/29/2011 Exit[employer] Patient Information 2015 [employer]. This information is not intended to replace advice given to you by your health care provider. Make sure you discuss any questions you have with your health care provider.  

## 2014-03-22 NOTE — Addendum Note (Signed)
Addended by: Carmelina Dane on: 03/22/2014 05:36 PM   Modules accepted: Level of Service

## 2014-03-22 NOTE — Discharge Instructions (Signed)
SEEK IMMEDIATE MEDICAL ATTENTION IF: New numbness, tingling, weakness, or problem with the use of your arms or legs.  Severe back pain not relieved with medications.  Change in bowel or bladder control.  Increasing pain in any areas of the body (such as chest or abdominal pain).  Shortness of breath, dizziness or fainting.  Nausea (feeling sick to your stomach), vomiting, fever, or sweats.  

## 2014-03-22 NOTE — ED Provider Notes (Signed)
CSN: [id number]     Arrival date & time 03/22/14  1750 History   First MD Initiated Contact with Patient 03/22/14 2052     Chief Complaint  Patient presents with  . Back Pain     (Consider location/radiation/quality/duration/timing/severity/associated sxs/prior Treatment) HPI 58 year old female with chronic right lumbar radiculopathy with 6 months of weakness and numbness to her right leg with over 3 months of intermittent bladder incontinence 3 episodes including today with MRI in September apparently showing some degenerative changes without cauda equina compression and patient had follow-up abnormal nerve conduction studies with right leg weakness and numbness which have not changed recently, there is been no prior neurosurgery no history of IV drug abuse no history of cancer no fever no trauma no chest pain shortness of breath no abdominal pain she did have one episode of bladder incontinence this morning but was able to urinate during the rest of the day on her own with a sensation of incomplete emptying with some urinary dribbling after finishing voiding but has done that a few times over the last few months as well, she has normal sensation in her perineal region and perianal region. Past Medical History  Diagnosis Date  . Migraine   . Hypertension   . PONV (postoperative nausea and vomiting)     violent nausea/vomiting  . Hyperlipidemia   . Obesity   . Family history of ischemic heart disease   . Atypical chest pain     a. normal coronary CT 09/18/2013  . Arthritis    Past Surgical History  Procedure Laterality Date  . Back surgery    . Abdominal hysterectomy  2003  . Cholecystectomy N/A 06/27/2012    Procedure: LAPAROSCOPIC CHOLECYSTECTOMY WITH INTRAOPERATIVE CHOLANGIOGRAM;  Surgeon: Currie Paris, MD;  Location: [facility];  Service: General;  Laterality: N/A;  . Cesarean section    . Carpal tunnel release     Family History  Problem Relation Age of Onset  . Leukemia  Father   . Cancer Father   . Diabetes Maternal Grandmother   . Heart disease Paternal Grandmother   . Colon cancer Paternal Aunt 10  . Rheum arthritis Mother   . Heart disease Mother     2 coronary stents  . Heart disease Maternal Uncle   . Heart disease Maternal Uncle    History  Substance Use Topics  . Smoking status: Former Smoker    Types: Cigarettes    Quit date: 05/03/1998  . Smokeless tobacco: Never Used  . Alcohol Use: No   OB History    No data available     Review of Systems 10 Systems reviewed and are negative for acute change except as noted in the HPI.   Allergies  Hydrocodone  Home Medications   Prior to Admission medications   Medication Sig Start Date End Date Taking? Authorizing Provider  aspirin 81 MG tablet Take 81 mg by mouth daily.    Historical Provider, MD  atorvastatin (LIPITOR) 20 MG tablet Take 1 tablet (20 mg total) by mouth every evening. 11/05/13   Gwenlyn Found Copland, MD  citalopram (CELEXA) 20 MG tablet Take 1 tablet (20 mg total) by mouth daily. 11/05/13   Gwenlyn Found Copland, MD  cyclobenzaprine (FLEXERIL) 10 MG tablet Take 1 tablet (10 mg total) by mouth 3 (three) times daily as needed for muscle spasms. 02/23/14   Geoffery Lyons, MD  gabapentin (NEURONTIN) 300 MG capsule Take 300 mg by mouth 2 (two) times daily.  Historical Provider, MD  hydrochlorothiazide (HYDRODIURIL) 25 MG tablet Take 1 tablet (25 mg total) by mouth daily. 11/05/13   Gwenlyn Found Copland, MD  loratadine (CLARITIN) 10 MG tablet Take 1 tablet (10 mg total) by mouth daily. 10/23/13   Nelva Nay, PA-C  Methotrexate Sodium, PF, 50 MG/2ML SOLN Inject 60 mg as directed once a week.    Historical Provider, MD  naproxen (NAPROSYN) 500 MG tablet Take 500 mg by mouth 2 (two) times daily with a meal.    Historical Provider, MD  omeprazole (PRILOSEC) 20 MG capsule TAKE 1 CAPSULE BY MOUTH DAILY FOR POSSIBLE ACID REFLUX. 03/13/14   Jonita Albee, MD  ondansetron (ZOFRAN-ODT) 4 MG  disintegrating tablet DISSOLVE 1 TABLET ON TONGUE EVERY 8 HOURS AS NEEDED FOR NAUSEA OR VOMITING.    Gwenlyn Found Copland, MD  oxycodone-acetaminophen (PERCOCET) 2.5-325 MG per tablet Take 2 tablets by mouth every 4 (four) hours as needed for pain.    Historical Provider, MD  oxyCODONE-acetaminophen (PERCOCET) 5-325 MG per tablet Take 1-2 tablets by mouth every 4 (four) hours as needed. 03/22/14   Carmelina Dane, MD  potassium bicarbonate (KLOR-CON/EF) 25 MEQ disintegrating tablet Take 1 tablet (25 mEq total) by mouth daily. 10/04/13   Morrell Riddle, PA-C  predniSONE (DELTASONE) 20 MG tablet 2 tabs daily x 5 days 03/22/14   Hurman Horn, MD  propranolol (INDERAL) 80 MG tablet TAKE 1 TABLET BY MOUTH DAILY. 11/05/13   Gwenlyn Found Copland, MD  traMADol (ULTRAM) 50 MG tablet Take 1-2 tablets (50-100 mg total) by mouth at bedtime as needed. 11/05/13   Pearline Cables, MD  traZODone (DESYREL) 100 MG tablet Take 1 -2 tablets every night as needed for sleep. 11/05/13   Gwenlyn Found Copland, MD   BP 129/78 mmHg  Pulse 65  Temp(Src) 97.6 F (36.4 C) (Oral)  Resp 18  Ht 5\' 3"  (1.6 m)  Wt 215 lb (97.523 kg)  BMI 38.09 kg/m2  SpO2 98% Physical Exam  Constitutional:  Awake, alert, nontoxic appearance with baseline speech.  HENT:  Head: Atraumatic.  Eyes: Pupils are equal, round, and reactive to light. Right eye exhibits no discharge. Left eye exhibits no discharge.  Neck: Neck supple.  Cardiovascular: Normal rate and regular rhythm.   No murmur heard. Pulmonary/Chest: Effort normal and breath sounds normal. No respiratory distress. She has no wheezes. She has no rales. She exhibits no tenderness.  Abdominal: Soft. Bowel sounds are normal. She exhibits no mass. There is no tenderness. There is no rebound.  Musculoskeletal: She exhibits tenderness.       Thoracic back: She exhibits no tenderness.       Lumbar back: She exhibits no tenderness.  Midline lower lumbar tenderness as well as right sacroiliac joint  tenderness without rash or erythema. Bilateral lower extremities non tender without new rashes or color change, baseline ROM with intact DP pulses, CR<2 secs all digits bilaterally, sensation baseline light touch bilaterally for pt with decreased light touch to the patient's right lateral thigh as well as entire right lower leg and entire right foot, DTR's symmetric and intact bilaterally KJ / AJ, motor asymmetric with 5 / 5 left hip flexion, quadriceps, hamstrings, EHL, foot dorsiflexion, foot plantarflexion, with 4 out of 5 strength to right quadriceps hamstrings foot dorsiflexion EHL and foot plantar flexion with gait somewhat antalgic but without apparent new ataxia. Chaperone present for rectal examination with normal perianal sensation normal anal sphincter tone.  Neurological: She is alert.  Mental status baseline for patient.  Upper extremity motor strength and sensation intact and symmetric bilaterally.  Skin: No rash noted.  Psychiatric: She has a normal mood and affect.  Nursing note and vitals reviewed.   ED Course  Procedures (including critical care time) D/w NSurg doubt cauda equina since last MRI after Pt's bladder Sxs began. Labs Review Labs Reviewed - No data to display  Imaging Review No results found.   EKG Interpretation None      MDM   Final diagnoses:  Lumbar radiculopathy    Patient / Family / Caregiver informed of clinical course, understand medical decision-making process, and agree with plan. I doubt any other EMC precluding discharge at this time including, but not necessarily limited to the following:SBI, cauda equina syndrome.    Hurman Horn, MD 03/28/14 [phone]

## 2014-04-04 ENCOUNTER — Ambulatory Visit
Admission: RE | Admit: 2014-04-04 | Discharge: 2014-04-04 | Disposition: A | Discharge status: Home / Self Care | Payer: BC Managed Care – PPO | Source: Ambulatory Visit | Attending: Neurosurgery | Admitting: Neurosurgery

## 2014-04-04 DIAGNOSIS — M4807 Spinal stenosis, lumbosacral region: Secondary | ICD-10-CM

## 2014-04-04 MED ORDER — IOHEXOL 300 MG/ML  SOLN
10.0000 mL | Freq: Once | INTRAMUSCULAR | Status: AC | PRN
  Administered 2014-04-04: 10 mL via INTRATHECAL

## 2014-04-04 MED ORDER — MEPERIDINE HCL 100 MG/ML IJ SOLN
75.0000 mg | Freq: Once | INTRAMUSCULAR | Status: AC
  Administered 2014-04-04: 75 mg via INTRAMUSCULAR

## 2014-04-04 MED ORDER — ONDANSETRON HCL 4 MG/2ML IJ SOLN
4.0000 mg | Freq: Once | INTRAMUSCULAR | Status: AC
  Administered 2014-04-04: 4 mg via INTRAMUSCULAR

## 2014-04-04 MED ORDER — DIAZEPAM 5 MG PO TABS
10.0000 mg | ORAL_TABLET | Freq: Once | ORAL | Status: AC
  Administered 2014-04-04: 10 mg via ORAL

## 2014-04-04 NOTE — Discharge Instructions (Addendum)
Myelogram Discharge Instructions  1. Go home and rest quietly for the next 24 hours.  It is important to lie flat for the next 24 hours.  Get up only to go to the restroom.  You may lie in the bed or on a couch on your back, your stomach, your left side or your right side.  You may have one pillow under your head.  You may have pillows between your knees while you are on your side or under your knees while you are on your back.  2. DO NOT drive today.  Recline the seat as far back as it will go, while still wearing your seat belt, on the way home.  3. You may get up to go to the bathroom as needed.  You may sit up for 10 minutes to eat.  You may resume your normal diet and medications unless otherwise indicated.  Drink plenty of extra fluids today and tomorrow.  4. The incidence of a spinal headache with nausea and/or vomiting is about 5% (one in 20 patients).  If you develop a headache, lie flat and drink plenty of fluids until the headache goes away.  Caffeinated beverages may be helpful.  If you develop severe nausea and vomiting or a headache that does not go away with flat bed rest, call [phone].  5. You may resume normal activities after your 24 hours of bed rest is over; however, do not exert yourself strongly or do any heavy lifting tomorrow.  6. Call your physician for a follow-up appointment.   You may resume Citalopram and Trazodone on Friday, April 05, 2014 after 9:30a.m.

## 2014-04-15 ENCOUNTER — Other Ambulatory Visit: Payer: Self-pay | Admitting: Neurosurgery

## 2014-04-17 NOTE — Pre-Procedure Instructions (Signed)
[name]  04/17/2014   Your procedure is scheduled on:  Tues, Dec 22 @ 4:30 PM  Report to [facility] Entr[facility]  at 1:30 PM.  Call this number if you have problems the morning of surgery: [phone]   Remember:   Do not eat food or drink liquids after midnight.   Take these medicines the morning of surgery with A SIP OF WATER: Celexa(Citalopram),Gabapentin(Neurontin),Claritin(Loratadine),Omeprazole(Prilosec),Pain Pill(if needed),and Propranolol(Inderal)               Stop taking your Methotrexate,Aspirin,and Naproxen. No Goody's,BC's,Ibuprofen,Fish Oil,or any Herbal Medications   Do not wear jewelry, make-up or nail polish.  Do not wear lotions, powders, or perfumes. You may wear deodorant.  Do not shave 48 hours prior to surgery.   Do not bring valuables to the [facility].  [name] is not responsible                  for any belongings or valuables.               Contacts, dentures or bridgework may not be worn into surgery.  Leave suitcase in the car. After surgery it may be brought to your room.  For patients admitted to the [facility], discharge time is determined by your                treatment team.               Patients discharged the day of surgery will not be allowed to drive  home.    Special Instructions:  [name] - Preparing for Surgery  Before surgery, you can play an important role.  Because skin is not sterile, your skin needs to be as free of germs as possible.  You can reduce the number of germs on you skin by washing with CHG (chlorahexidine gluconate) soap before surgery.  CHG is an antiseptic cleaner which kills germs and bonds with the skin to continue killing germs even after washing.  Please DO NOT use if you have an allergy to CHG or antibacterial soaps.  If your skin becomes reddened/irritated stop using the CHG and inform your nurse when you arrive at Short Stay.  Do not shave (including legs and underarms) for at least 48 hours prior to the first  CHG shower.  You may shave your face.  Please follow these instructions carefully:   1.  Shower with CHG Soap the night before surgery and the                                morning of Surgery.  2.  If you choose to wash your hair, wash your hair first as usual with your       normal shampoo.  3.  After you shampoo, rinse your hair and body thoroughly to remove the                      Shampoo.  4.  Use CHG as you would any other liquid soap.  You can apply chg directly       to the skin and wash gently with scrungie or a clean washcloth.  5.  Apply the CHG Soap to your body ONLY FROM THE NECK DOWN.        Do not use on open wounds or open sores.  Avoid contact with your eyes,  ears, mouth and genitals (private parts).  Wash genitals (private parts)       with your normal soap.  6.  Wash thoroughly, paying special attention to the area where your surgery        will be performed.  7.  Thoroughly rinse your body with warm water from the neck down.  8.  DO NOT shower/wash with your normal soap after using and rinsing off       the CHG Soap.  9.  Pat yourself dry with a clean towel.            10.  Wear clean pajamas.            11.  Place clean sheets on your bed the night of your first shower and do not        sleep with pets.  Day of Surgery  Do not apply any lotions/deoderants the morning of surgery.  Please wear clean clothes to the [facility]/surgery center.     Please read over the following fact sheets that you were given: Pain Booklet, Coughing and Deep Breathing, MRSA Information and Surgical Site Infection Prevention

## 2014-04-18 ENCOUNTER — Encounter (HOSPITAL_COMMUNITY)
Admission: RE | Admit: 2014-04-18 | Discharge: 2014-04-18 | Disposition: A | Discharge status: Home / Self Care | Payer: BC Managed Care – PPO | Source: Ambulatory Visit | Attending: Neurosurgery | Admitting: Neurosurgery

## 2014-04-18 ENCOUNTER — Encounter (HOSPITAL_COMMUNITY): Payer: Self-pay

## 2014-04-18 DIAGNOSIS — Z01812 Encounter for preprocedural laboratory examination: Secondary | ICD-10-CM | POA: Diagnosis not present

## 2014-04-18 HISTORY — DX: Effusion, unspecified joint: M25.40

## 2014-04-18 HISTORY — DX: Gastro-esophageal reflux disease without esophagitis: K21.9

## 2014-04-18 HISTORY — DX: Urgency of urination: R39.15

## 2014-04-18 HISTORY — DX: Muscle spasm of back: M62.830

## 2014-04-18 HISTORY — DX: Personal history of other infectious and parasitic diseases: Z86.19

## 2014-04-18 HISTORY — DX: Insomnia, unspecified: G47.00

## 2014-04-18 HISTORY — DX: Pneumonia, unspecified organism: J18.9

## 2014-04-18 HISTORY — DX: Personal history of other diseases of the respiratory system: Z87.09

## 2014-04-18 HISTORY — DX: Pain in unspecified joint: M25.50

## 2014-04-18 HISTORY — DX: Anxiety disorder, unspecified: F41.9

## 2014-04-18 HISTORY — DX: Other seasonal allergic rhinitis: J30.2

## 2014-04-18 HISTORY — DX: Other chronic pain: G89.29

## 2014-04-18 HISTORY — DX: Dorsalgia, unspecified: M54.9

## 2014-04-18 HISTORY — DX: Rheumatoid arthritis, unspecified: M06.9

## 2014-04-18 LAB — BASIC METABOLIC PANEL
Anion gap: 12 (ref 5–15)
BUN: 21 mg/dL (ref 6–23)
CHLORIDE: 102 meq/L (ref 96–112)
CO2: 28 mEq/L (ref 19–32)
CREATININE: 0.73 mg/dL (ref 0.50–1.10)
Calcium: 9.1 mg/dL (ref 8.4–10.5)
GFR calc Af Amer: >90 mL/min (ref >90)
Glucose, Bld: 132 mg/dL — ABNORMAL HIGH (ref 70–99)
POTASSIUM: 3.5 meq/L — AB (ref 3.7–5.3)
Sodium: 142 mEq/L (ref 137–147)

## 2014-04-18 LAB — CBC
HCT: 37.4 % (ref 36.0–46.0)
Hemoglobin: 11.9 g/dL — ABNORMAL LOW (ref 12.0–15.0)
MCH: 29.4 pg (ref 26.0–34.0)
MCHC: 31.8 g/dL (ref 30.0–36.0)
MCV: 92.3 fL (ref 78.0–100.0)
PLATELETS: 313 10*3/uL (ref 150–400)
RBC: 4.05 MIL/uL (ref 3.87–5.11)
RDW: 15.1 % (ref 11.5–15.5)
WBC: 5.9 10*3/uL (ref 4.0–10.5)

## 2014-04-18 LAB — SURGICAL PCR SCREEN
MRSA, PCR: NEGATIVE
STAPHYLOCOCCUS AUREUS: NEGATIVE

## 2014-04-18 NOTE — Progress Notes (Addendum)
Echo report in epic from 2015  EKG in epic from 09-18-13  Cardiologist is Dr.Hilty with last visit in May 2015 and to follow up prn  Denies ever having a stress test/ heart cath  Medical MD is Dr.Chris Perrin Maltese

## 2014-04-19 NOTE — H&P (Signed)
[name] is an 58 y.o. female.   Chief Complaint: right leg pain HPI: patient seen in the emergency service because of right leg pain. Seen in my office a lumbar myelogram was done as well as emg of both legs. Because of worsening of the pain she wants to go ahead with surgery  Past Medical History  Diagnosis Date  . PONV (postoperative nausea and vomiting)     violent nausea/vomiting  . Hyperlipidemia     takes Atorvastatin daily  . Obesity   . Arthritis   . Anxiety     takes Citalopram daily  . Muscle spasm of back     takes Flexeril daily as needed  . Seasonal allergies     takes Claritin daily  . GERD (gastroesophageal reflux disease)     takes Omeprazole daily  . Hypertension     takes HCTZ daily as well as Propranolol  . Pneumonia     hx of-many,many yrs ago  . History of bronchitis     last yr  . Migraine     hx of  . Rheumatoid arthritis   . Joint pain   . Joint swelling   . Chronic back pain     radiculopathy  . Urinary urgency   . Diabetes mellitus without complication     "pre"  . Insomnia     takes trazodone nightly  . History of shingles     Past Surgical History  Procedure Laterality Date  . Back surgery    . Abdominal hysterectomy  2003  . Cholecystectomy N/A 06/27/2012    Procedure: LAPAROSCOPIC CHOLECYSTECTOMY WITH INTRAOPERATIVE CHOLANGIOGRAM;  Surgeon: Haywood Lasso, MD;  Location: [facility];  Service: General;  Laterality: N/A;  . Carpal tunnel release Bilateral   . Neck surgery    . Colonoscopy      Family History  Problem Relation Age of Onset  . Leukemia Father   . Cancer Father   . Diabetes Maternal Grandmother   . Heart disease Paternal Grandmother   . Colon cancer Paternal Aunt 91  . Rheum arthritis Mother   . Heart disease Mother     2 coronary stents  . Heart disease Maternal Uncle   . Heart disease Maternal Uncle    Social History:  reports that she has quit smoking. Her smoking use included Cigarettes. She smoked 0.00  packs per day. She has never used smokeless tobacco. She reports that she does not drink alcohol or use illicit drugs.  Allergies:  Allergies  Allergen Reactions  . Hydrocodone Itching    No prescriptions prior to admission    Results for orders placed or performed during the hospital encounter of 04/18/14 (from the past 48 hour(s))  Surgical pcr screen     Status: None   Collection Time: 04/18/14 12:46 PM  Result Value Ref Range   MRSA, PCR NEGATIVE NEGATIVE   Staphylococcus aureus NEGATIVE NEGATIVE    Comment:        The Xpert SA Assay (FDA approved for NASAL specimens in patients over 54 years of age), is one component of a comprehensive surveillance program.  Test performance has been validated by [employer] for patients greater than or equal to 28 year old. It is not intended to diagnose infection nor to guide or monitor treatment.   Basic metabolic panel     Status: Abnormal   Collection Time: 04/18/14 12:46 PM  Result Value Ref Range   Sodium 142 137 - 147 mEq/L  Potassium 3.5 (L) 3.7 - 5.3 mEq/L   Chloride 102 96 - 112 mEq/L   CO2 28 19 - 32 mEq/L   Glucose, Bld 132 (H) 70 - 99 mg/dL   BUN 21 6 - 23 mg/dL   Creatinine, Ser 0.73 0.50 - 1.10 mg/dL   Calcium 9.1 8.4 - 10.5 mg/dL   GFR calc non Af Amer >90 >90 mL/min   GFR calc Af Amer >90 >90 mL/min    Comment: (NOTE) The eGFR has been calculated using the CKD EPI equation. This calculation has not been validated in all clinical situations. eGFR's persistently <90 mL/min signify possible Chronic Kidney Disease.    Anion gap 12 5 - 15  CBC     Status: Abnormal   Collection Time: 04/18/14 12:46 PM  Result Value Ref Range   WBC 5.9 4.0 - 10.5 K/uL   RBC 4.05 3.87 - 5.11 MIL/uL   Hemoglobin 11.9 (L) 12.0 - 15.0 g/dL   HCT 37.4 36.0 - 46.0 %   MCV 92.3 78.0 - 100.0 fL   MCH 29.4 26.0 - 34.0 pg   MCHC 31.8 30.0 - 36.0 g/dL   RDW 15.1 11.5 - 15.5 %   Platelets 313 150 - 400 K/uL   No results  found.  Review of Systems  Constitutional: Negative.   Respiratory: Negative.   Cardiovascular: Negative.   Gastrointestinal: Negative.   Genitourinary: Negative.   Musculoskeletal: Positive for back pain and neck pain.  Skin: Negative.   Neurological: Positive for sensory change and focal weakness.  Psychiatric/Behavioral: Negative.     There were no vitals taken for this visit. Physical Exam hent, nl. Neck, anterior scar. Cv, nl. Lungs, clear. Abdomen, soft. Extremities, nl. NEURO WEKANES OF PLANTIFLEXION and dorsiflexion right foot. SLR positive at 30 degrees on the rigt. EMG?NCV shows right l5s1 neuronal compromise. Myelo hows stenosis at right l5s1  Assessment/Plan Patient to go ahead with laminotomy and foraminotomy at l5s1. She is aware of risks and benefis  Lauri Till M 04/19/2014, 6:33 PM

## 2014-04-22 MED ORDER — CEFAZOLIN SODIUM-DEXTROSE 2-3 GM-% IV SOLR
2.0000 g | INTRAVENOUS | Status: AC
  Administered 2014-04-23: 2 g via INTRAVENOUS
  Filled 2014-04-22: qty 50

## 2014-04-23 ENCOUNTER — Ambulatory Visit (HOSPITAL_COMMUNITY): Payer: BC Managed Care – PPO | Admitting: Anesthesiology

## 2014-04-23 ENCOUNTER — Encounter (HOSPITAL_COMMUNITY): Payer: Self-pay | Admitting: *Deleted

## 2014-04-23 ENCOUNTER — Ambulatory Visit (HOSPITAL_COMMUNITY): Payer: BC Managed Care – PPO

## 2014-04-23 ENCOUNTER — Encounter (HOSPITAL_COMMUNITY)
Admission: RE | Disposition: A | Discharge status: Home / Self Care | Payer: Self-pay | Source: Ambulatory Visit | Attending: Neurosurgery

## 2014-04-23 ENCOUNTER — Inpatient Hospital Stay (HOSPITAL_COMMUNITY)
Admission: RE | Admit: 2014-04-23 | Discharge: 2014-04-24 | DRG: 517 | Disposition: A | Discharge status: Home / Self Care | Payer: BC Managed Care – PPO | Source: Ambulatory Visit | Attending: Neurosurgery | Admitting: Neurosurgery

## 2014-04-23 DIAGNOSIS — Z8249 Family history of ischemic heart disease and other diseases of the circulatory system: Secondary | ICD-10-CM

## 2014-04-23 DIAGNOSIS — M069 Rheumatoid arthritis, unspecified: Secondary | ICD-10-CM | POA: Diagnosis present

## 2014-04-23 DIAGNOSIS — E669 Obesity, unspecified: Secondary | ICD-10-CM | POA: Diagnosis present

## 2014-04-23 DIAGNOSIS — F419 Anxiety disorder, unspecified: Secondary | ICD-10-CM | POA: Diagnosis present

## 2014-04-23 DIAGNOSIS — M4807 Spinal stenosis, lumbosacral region: Principal | ICD-10-CM | POA: Diagnosis present

## 2014-04-23 DIAGNOSIS — Z885 Allergy status to narcotic agent status: Secondary | ICD-10-CM | POA: Diagnosis not present

## 2014-04-23 DIAGNOSIS — E119 Type 2 diabetes mellitus without complications: Secondary | ICD-10-CM | POA: Diagnosis present

## 2014-04-23 DIAGNOSIS — Z8619 Personal history of other infectious and parasitic diseases: Secondary | ICD-10-CM | POA: Diagnosis not present

## 2014-04-23 DIAGNOSIS — G4700 Insomnia, unspecified: Secondary | ICD-10-CM | POA: Diagnosis present

## 2014-04-23 DIAGNOSIS — Z6838 Body mass index (BMI) 38.0-38.9, adult: Secondary | ICD-10-CM

## 2014-04-23 DIAGNOSIS — M1990 Unspecified osteoarthritis, unspecified site: Secondary | ICD-10-CM | POA: Diagnosis present

## 2014-04-23 DIAGNOSIS — G8929 Other chronic pain: Secondary | ICD-10-CM | POA: Diagnosis present

## 2014-04-23 DIAGNOSIS — M4806 Spinal stenosis, lumbar region: Secondary | ICD-10-CM | POA: Diagnosis present

## 2014-04-23 DIAGNOSIS — Z419 Encounter for procedure for purposes other than remedying health state, unspecified: Secondary | ICD-10-CM

## 2014-04-23 DIAGNOSIS — M5417 Radiculopathy, lumbosacral region: Secondary | ICD-10-CM | POA: Diagnosis present

## 2014-04-23 DIAGNOSIS — I10 Essential (primary) hypertension: Secondary | ICD-10-CM | POA: Diagnosis present

## 2014-04-23 DIAGNOSIS — J302 Other seasonal allergic rhinitis: Secondary | ICD-10-CM | POA: Diagnosis present

## 2014-04-23 DIAGNOSIS — Z90710 Acquired absence of both cervix and uterus: Secondary | ICD-10-CM | POA: Diagnosis not present

## 2014-04-23 DIAGNOSIS — Z9049 Acquired absence of other specified parts of digestive tract: Secondary | ICD-10-CM | POA: Diagnosis present

## 2014-04-23 DIAGNOSIS — Z833 Family history of diabetes mellitus: Secondary | ICD-10-CM | POA: Diagnosis not present

## 2014-04-23 DIAGNOSIS — K219 Gastro-esophageal reflux disease without esophagitis: Secondary | ICD-10-CM | POA: Diagnosis present

## 2014-04-23 DIAGNOSIS — E785 Hyperlipidemia, unspecified: Secondary | ICD-10-CM | POA: Diagnosis present

## 2014-04-23 DIAGNOSIS — M62830 Muscle spasm of back: Secondary | ICD-10-CM | POA: Diagnosis present

## 2014-04-23 DIAGNOSIS — G43909 Migraine, unspecified, not intractable, without status migrainosus: Secondary | ICD-10-CM | POA: Diagnosis present

## 2014-04-23 DIAGNOSIS — M48061 Spinal stenosis, lumbar region without neurogenic claudication: Secondary | ICD-10-CM | POA: Diagnosis present

## 2014-04-23 HISTORY — PX: LUMBAR LAMINECTOMY/DECOMPRESSION MICRODISCECTOMY: SHX5026

## 2014-04-23 LAB — GLUCOSE, CAPILLARY
Glucose-Capillary: 94 mg/dL (ref 70–99)
Glucose-Capillary: 97 mg/dL (ref 70–99)

## 2014-04-23 SURGERY — LUMBAR LAMINECTOMY/DECOMPRESSION MICRODISCECTOMY 1 LEVEL
Anesthesia: General | Site: Back | Laterality: Right

## 2014-04-23 MED ORDER — SCOPOLAMINE 1 MG/3DAYS TD PT72
MEDICATED_PATCH | TRANSDERMAL | Status: AC
  Filled 2014-04-23: qty 1

## 2014-04-23 MED ORDER — SCOPOLAMINE 1 MG/3DAYS TD PT72
1.0000 | MEDICATED_PATCH | TRANSDERMAL | Status: DC
  Administered 2014-04-23: 1.5 mg via TRANSDERMAL

## 2014-04-23 MED ORDER — NEOSTIGMINE METHYLSULFATE 10 MG/10ML IV SOLN
INTRAVENOUS | Status: AC
  Filled 2014-04-23: qty 1

## 2014-04-23 MED ORDER — DIAZEPAM 5 MG PO TABS
5.0000 mg | ORAL_TABLET | Freq: Four times a day (QID) | ORAL | Status: DC | PRN
  Administered 2014-04-24: 5 mg via ORAL
  Filled 2014-04-23: qty 1

## 2014-04-23 MED ORDER — ACETAMINOPHEN 325 MG PO TABS
650.0000 mg | ORAL_TABLET | ORAL | Status: DC | PRN
  Administered 2014-04-24: 650 mg via ORAL
  Filled 2014-04-23: qty 2

## 2014-04-23 MED ORDER — FENTANYL CITRATE 0.05 MG/ML IJ SOLN
INTRAMUSCULAR | Status: AC
  Filled 2014-04-23: qty 5

## 2014-04-23 MED ORDER — EPHEDRINE SULFATE 50 MG/ML IJ SOLN
INTRAMUSCULAR | Status: DC | PRN
  Administered 2014-04-23: 10 mg via INTRAVENOUS

## 2014-04-23 MED ORDER — HYDROMORPHONE HCL 1 MG/ML IJ SOLN
INTRAMUSCULAR | Status: AC
  Filled 2014-04-23: qty 1

## 2014-04-23 MED ORDER — PROMETHAZINE HCL 25 MG/ML IJ SOLN: 6.2500 mg | INTRAMUSCULAR | Status: DC | PRN

## 2014-04-23 MED ORDER — SODIUM CHLORIDE 0.9 % IV SOLN
INTRAVENOUS | Status: DC
  Administered 2014-04-24: 01:00:00 via INTRAVENOUS

## 2014-04-23 MED ORDER — ZOLPIDEM TARTRATE 5 MG PO TABS: 5.0000 mg | ORAL_TABLET | Freq: Every evening | ORAL | Status: DC | PRN

## 2014-04-23 MED ORDER — ASPIRIN 81 MG PO CHEW
81.0000 mg | CHEWABLE_TABLET | Freq: Every day | ORAL | Status: DC
  Administered 2014-04-24 (×2): 81 mg via ORAL
  Filled 2014-04-23 (×3): qty 1

## 2014-04-23 MED ORDER — PHENOL 1.4 % MT LIQD: 1.0000 | OROMUCOSAL | Status: DC | PRN

## 2014-04-23 MED ORDER — PROPOFOL 10 MG/ML IV BOLUS
INTRAVENOUS | Status: AC
  Filled 2014-04-23: qty 20

## 2014-04-23 MED ORDER — THROMBIN 20000 UNITS EX SOLR
CUTANEOUS | Status: DC | PRN
  Administered 2014-04-23: 20 mL via TOPICAL

## 2014-04-23 MED ORDER — MENTHOL 3 MG MT LOZG
1.0000 | LOZENGE | OROMUCOSAL | Status: DC | PRN
  Administered 2014-04-24: 3 mg via ORAL
  Filled 2014-04-23: qty 9

## 2014-04-23 MED ORDER — SODIUM CHLORIDE 0.9 % IJ SOLN
3.0000 mL | Freq: Two times a day (BID) | INTRAMUSCULAR | Status: DC
  Administered 2014-04-24 (×2): 3 mL via INTRAVENOUS

## 2014-04-23 MED ORDER — LACTATED RINGERS IV SOLN
INTRAVENOUS | Status: DC
  Administered 2014-04-23: 13:00:00 via INTRAVENOUS

## 2014-04-23 MED ORDER — GABAPENTIN 300 MG PO CAPS
300.0000 mg | ORAL_CAPSULE | Freq: Two times a day (BID) | ORAL | Status: DC
  Administered 2014-04-24 (×2): 300 mg via ORAL
  Filled 2014-04-23 (×2): qty 1

## 2014-04-23 MED ORDER — SCOPOLAMINE 1 MG/3DAYS TD PT72
MEDICATED_PATCH | TRANSDERMAL | Status: DC | PRN
  Administered 2014-04-23: 1 via TRANSDERMAL

## 2014-04-23 MED ORDER — FENTANYL CITRATE 0.05 MG/ML IJ SOLN
INTRAMUSCULAR | Status: AC
  Filled 2014-04-23: qty 2

## 2014-04-23 MED ORDER — MORPHINE SULFATE 2 MG/ML IJ SOLN
1.0000 mg | INTRAMUSCULAR | Status: DC | PRN
  Administered 2014-04-24: 2 mg via INTRAVENOUS
  Filled 2014-04-23 (×2): qty 1

## 2014-04-23 MED ORDER — ONDANSETRON HCL 4 MG/2ML IJ SOLN: 4.0000 mg | INTRAMUSCULAR | Status: DC | PRN

## 2014-04-23 MED ORDER — ONDANSETRON HCL 4 MG/2ML IJ SOLN
INTRAMUSCULAR | Status: DC | PRN
  Administered 2014-04-23: 4 mg via INTRAVENOUS

## 2014-04-23 MED ORDER — MIDAZOLAM HCL 5 MG/5ML IJ SOLN
INTRAMUSCULAR | Status: DC | PRN
  Administered 2014-04-23: 2 mg via INTRAVENOUS

## 2014-04-23 MED ORDER — BUPIVACAINE LIPOSOME 1.3 % IJ SUSP
20.0000 mL | INTRAMUSCULAR | Status: AC
  Filled 2014-04-23: qty 20

## 2014-04-23 MED ORDER — METOCLOPRAMIDE HCL 5 MG/ML IJ SOLN
INTRAMUSCULAR | Status: DC | PRN
  Administered 2014-04-23: 10 mg via INTRAVENOUS

## 2014-04-23 MED ORDER — PROPRANOLOL HCL 10 MG PO TABS: 10.0000 mg | ORAL_TABLET | Freq: Two times a day (BID) | ORAL | Status: DC

## 2014-04-23 MED ORDER — PREDNISONE 1 MG PO TABS
1.0000 mg | ORAL_TABLET | Freq: Two times a day (BID) | ORAL | Status: DC
  Administered 2014-04-24: 1 mg via ORAL
  Filled 2014-04-23 (×4): qty 1

## 2014-04-23 MED ORDER — DIPHENHYDRAMINE HCL 50 MG/ML IJ SOLN
INTRAMUSCULAR | Status: AC
  Filled 2014-04-23: qty 1

## 2014-04-23 MED ORDER — ATORVASTATIN CALCIUM 20 MG PO TABS
20.0000 mg | ORAL_TABLET | Freq: Every evening | ORAL | Status: DC
  Administered 2014-04-24: 20 mg via ORAL
  Filled 2014-04-23 (×2): qty 1
  Filled 2014-04-23: qty 2

## 2014-04-23 MED ORDER — PROPRANOLOL HCL 80 MG PO TABS
80.0000 mg | ORAL_TABLET | Freq: Every day | ORAL | Status: DC
  Administered 2014-04-24: 80 mg via ORAL
  Filled 2014-04-23: qty 1

## 2014-04-23 MED ORDER — METHYLPREDNISOLONE ACETATE 80 MG/ML IJ SUSP
INTRAMUSCULAR | Status: DC | PRN
  Administered 2014-04-23: 80 mg

## 2014-04-23 MED ORDER — BUPIVACAINE LIPOSOME 1.3 % IJ SUSP
INTRAMUSCULAR | Status: DC | PRN
  Administered 2014-04-23: 20 mL

## 2014-04-23 MED ORDER — NEOSTIGMINE METHYLSULFATE 10 MG/10ML IV SOLN
INTRAVENOUS | Status: DC | PRN
  Administered 2014-04-23: 3 mg via INTRAVENOUS

## 2014-04-23 MED ORDER — SODIUM CHLORIDE 0.9 % IV SOLN: 250.0000 mL | INTRAVENOUS | Status: DC

## 2014-04-23 MED ORDER — OXYCODONE-ACETAMINOPHEN 5-325 MG PO TABS
ORAL_TABLET | ORAL | Status: AC
  Filled 2014-04-23: qty 2

## 2014-04-23 MED ORDER — HYDROMORPHONE HCL 1 MG/ML IJ SOLN
0.2500 mg | INTRAMUSCULAR | Status: DC | PRN
  Administered 2014-04-23: 0.25 mg via INTRAVENOUS

## 2014-04-23 MED ORDER — DEXAMETHASONE SODIUM PHOSPHATE 4 MG/ML IJ SOLN
INTRAMUSCULAR | Status: DC | PRN
  Administered 2014-04-23: 4 mg via INTRAVENOUS

## 2014-04-23 MED ORDER — CITALOPRAM HYDROBROMIDE 20 MG PO TABS
20.0000 mg | ORAL_TABLET | Freq: Every day | ORAL | Status: DC
  Administered 2014-04-24: 20 mg via ORAL
  Filled 2014-04-23: qty 2
  Filled 2014-04-23: qty 1

## 2014-04-23 MED ORDER — SODIUM CHLORIDE 0.9 % IJ SOLN: 3.0000 mL | INTRAMUSCULAR | Status: DC | PRN

## 2014-04-23 MED ORDER — HYDROCHLOROTHIAZIDE 25 MG PO TABS
25.0000 mg | ORAL_TABLET | Freq: Every day | ORAL | Status: DC
  Administered 2014-04-24: 25 mg via ORAL
  Filled 2014-04-23: qty 1

## 2014-04-23 MED ORDER — DIPHENHYDRAMINE HCL 50 MG/ML IJ SOLN
INTRAMUSCULAR | Status: DC | PRN
Start: 2014-04-23 — End: 2014-04-23
  Administered 2014-04-23: 12.5 mg via INTRAVENOUS

## 2014-04-23 MED ORDER — LACTATED RINGERS IV SOLN
INTRAVENOUS | Status: DC | PRN
  Administered 2014-04-23 (×2): via INTRAVENOUS

## 2014-04-23 MED ORDER — ACETAMINOPHEN 650 MG RE SUPP: 650.0000 mg | RECTAL | Status: DC | PRN

## 2014-04-23 MED ORDER — CEFAZOLIN SODIUM 1-5 GM-% IV SOLN
1.0000 g | Freq: Three times a day (TID) | INTRAVENOUS | Status: DC
  Administered 2014-04-24: 1 g via INTRAVENOUS
  Filled 2014-04-23 (×2): qty 50

## 2014-04-23 MED ORDER — METOCLOPRAMIDE HCL 5 MG/ML IJ SOLN
INTRAMUSCULAR | Status: AC
  Filled 2014-04-23: qty 2

## 2014-04-23 MED ORDER — OXYCODONE-ACETAMINOPHEN 5-325 MG PO TABS
2.0000 | ORAL_TABLET | ORAL | Status: DC | PRN
  Administered 2014-04-23: 2 via ORAL

## 2014-04-23 MED ORDER — FENTANYL CITRATE 0.05 MG/ML IJ SOLN
INTRAMUSCULAR | Status: DC | PRN
  Administered 2014-04-23: 100 ug via INTRAVENOUS

## 2014-04-23 MED ORDER — LORATADINE 10 MG PO TABS
10.0000 mg | ORAL_TABLET | Freq: Every day | ORAL | Status: DC
  Administered 2014-04-24: 10 mg via ORAL
  Filled 2014-04-23: qty 1

## 2014-04-23 MED ORDER — GABAPENTIN 300 MG PO CAPS: 300.0000 mg | ORAL_CAPSULE | Freq: Three times a day (TID) | ORAL | Status: DC

## 2014-04-23 MED ORDER — DEXAMETHASONE SODIUM PHOSPHATE 4 MG/ML IJ SOLN
INTRAMUSCULAR | Status: AC
  Filled 2014-04-23: qty 1

## 2014-04-23 MED ORDER — GLYCOPYRROLATE 0.2 MG/ML IJ SOLN
INTRAMUSCULAR | Status: AC
  Filled 2014-04-23: qty 2

## 2014-04-23 MED ORDER — OXYCODONE-ACETAMINOPHEN 5-325 MG PO TABS: 1.0000 | ORAL_TABLET | ORAL | Status: DC | PRN

## 2014-04-23 MED ORDER — GLYCOPYRROLATE 0.2 MG/ML IJ SOLN
INTRAMUSCULAR | Status: DC | PRN
  Administered 2014-04-23: 0.4 mg via INTRAVENOUS

## 2014-04-23 MED ORDER — ONDANSETRON HCL 4 MG/2ML IJ SOLN
INTRAMUSCULAR | Status: AC
  Filled 2014-04-23: qty 2

## 2014-04-23 MED ORDER — PANTOPRAZOLE SODIUM 40 MG PO TBEC
40.0000 mg | DELAYED_RELEASE_TABLET | Freq: Every day | ORAL | Status: DC
  Administered 2014-04-24 (×2): 40 mg via ORAL
  Filled 2014-04-23 (×2): qty 1

## 2014-04-23 MED ORDER — EPHEDRINE SULFATE 50 MG/ML IJ SOLN
INTRAMUSCULAR | Status: AC
  Filled 2014-04-23: qty 1

## 2014-04-23 MED ORDER — MIDAZOLAM HCL 2 MG/2ML IJ SOLN
INTRAMUSCULAR | Status: AC
  Filled 2014-04-23: qty 2

## 2014-04-23 MED ORDER — 0.9 % SODIUM CHLORIDE (POUR BTL) OPTIME
TOPICAL | Status: DC | PRN
  Administered 2014-04-23: 1000 mL

## 2014-04-23 SURGICAL SUPPLY — 57 items
BENZOIN TINCTURE PRP APPL 2/3 (GAUZE/BANDAGES/DRESSINGS) ×2 IMPLANT
BLADE CLIPPER SURG (BLADE) IMPLANT
BUR ACORN 6.0 (BURR) ×2 IMPLANT
BUR MATCHSTICK NEURO 3.0 LAGG (BURR) IMPLANT
CANISTER SUCT 3000ML (MISCELLANEOUS) ×2 IMPLANT
CONT SPEC 4OZ CLIKSEAL STRL BL (MISCELLANEOUS) ×2 IMPLANT
DRAPE LAPAROTOMY 100X72X124 (DRAPES) ×2 IMPLANT
DRAPE MICROSCOPE LEICA (MISCELLANEOUS) ×2 IMPLANT
DRAPE POUCH INSTRU U-SHP 10X18 (DRAPES) ×2 IMPLANT
DRSG OPSITE POSTOP 3X4 (GAUZE/BANDAGES/DRESSINGS) ×2 IMPLANT
DRSG PAD ABDOMINAL 8X10 ST (GAUZE/BANDAGES/DRESSINGS) IMPLANT
DURAPREP 26ML APPLICATOR (WOUND CARE) ×2 IMPLANT
ELECT REM PT RETURN 9FT ADLT (ELECTROSURGICAL) ×2
ELECTRODE REM PT RTRN 9FT ADLT (ELECTROSURGICAL) ×1 IMPLANT
GAUZE SPONGE 4X4 12PLY STRL (GAUZE/BANDAGES/DRESSINGS) ×2 IMPLANT
GAUZE SPONGE 4X4 16PLY XRAY LF (GAUZE/BANDAGES/DRESSINGS) IMPLANT
GLOVE BIO SURGEON STRL SZ 6.5 (GLOVE) ×4 IMPLANT
GLOVE BIOGEL M 8.0 STRL (GLOVE) ×2 IMPLANT
GLOVE ECLIPSE 8.5 STRL (GLOVE) ×2 IMPLANT
GLOVE EXAM NITRILE LRG STRL (GLOVE) IMPLANT
GLOVE EXAM NITRILE MD LF STRL (GLOVE) IMPLANT
GLOVE EXAM NITRILE XL STR (GLOVE) IMPLANT
GLOVE EXAM NITRILE XS STR PU (GLOVE) IMPLANT
GLOVE INDICATOR 6.5 STRL GRN (GLOVE) ×2 IMPLANT
GLOVE INDICATOR 8.0 STRL GRN (GLOVE) ×2 IMPLANT
GOWN STRL REUS W/ TWL LRG LVL3 (GOWN DISPOSABLE) ×2 IMPLANT
GOWN STRL REUS W/ TWL XL LVL3 (GOWN DISPOSABLE) ×1 IMPLANT
GOWN STRL REUS W/TWL 2XL LVL3 (GOWN DISPOSABLE) IMPLANT
GOWN STRL REUS W/TWL LRG LVL3 (GOWN DISPOSABLE) ×2
GOWN STRL REUS W/TWL XL LVL3 (GOWN DISPOSABLE) ×1
KIT BASIN OR (CUSTOM PROCEDURE TRAY) ×2 IMPLANT
KIT ROOM TURNOVER OR (KITS) ×2 IMPLANT
NEEDLE BLUNT 18X1 FOR OR ONLY (NEEDLE) ×2 IMPLANT
NEEDLE HYPO 18GX1.5 BLUNT FILL (NEEDLE) IMPLANT
NEEDLE HYPO 21X1.5 SAFETY (NEEDLE) ×2 IMPLANT
NEEDLE HYPO 25X1 1.5 SAFETY (NEEDLE) IMPLANT
NEEDLE SPNL 20GX3.5 QUINCKE YW (NEEDLE) IMPLANT
NS IRRIG 1000ML POUR BTL (IV SOLUTION) ×2 IMPLANT
PACK LAMINECTOMY NEURO (CUSTOM PROCEDURE TRAY) ×2 IMPLANT
PAD ARMBOARD 7.5X6 YLW CONV (MISCELLANEOUS) ×6 IMPLANT
PATTIES SURGICAL .5 X1 (DISPOSABLE) ×2 IMPLANT
RUBBERBAND STERILE (MISCELLANEOUS) ×4 IMPLANT
SPONGE LAP 4X18 X RAY DECT (DISPOSABLE) IMPLANT
SPONGE SURGIFOAM ABS GEL SZ50 (HEMOSTASIS) ×2 IMPLANT
STRIP CLOSURE SKIN 1/2X4 (GAUZE/BANDAGES/DRESSINGS) ×2 IMPLANT
SUT VIC AB 0 CT1 18XCR BRD8 (SUTURE) ×1 IMPLANT
SUT VIC AB 0 CT1 8-18 (SUTURE) ×1
SUT VIC AB 2-0 CP2 18 (SUTURE) ×2 IMPLANT
SUT VIC AB 3-0 SH 8-18 (SUTURE) ×2 IMPLANT
SYR 20CC LL (SYRINGE) ×2 IMPLANT
SYR 20ML ECCENTRIC (SYRINGE) ×2 IMPLANT
SYR 3ML LL SCALE MARK (SYRINGE) ×2 IMPLANT
SYR 5ML LL (SYRINGE) IMPLANT
TAPE STRIPS DRAPE STRL (GAUZE/BANDAGES/DRESSINGS) ×2 IMPLANT
TOWEL OR 17X24 6PK STRL BLUE (TOWEL DISPOSABLE) ×2 IMPLANT
TOWEL OR 17X26 10 PK STRL BLUE (TOWEL DISPOSABLE) ×2 IMPLANT
WATER STERILE IRR 1000ML POUR (IV SOLUTION) ×2 IMPLANT

## 2014-04-23 NOTE — Anesthesia Preprocedure Evaluation (Signed)
Anesthesia Evaluation  Patient identified by MRN, date of birth, ID band Patient awake    Reviewed: Allergy & Precautions, H&P , NPO status , Patient's Chart, lab work & pertinent test results  History of Anesthesia Complications (+) PONV and history of anesthetic complications  Airway Mallampati: II  TM Distance: >3 FB Neck ROM: Full    Dental  (+) Caps, Dental Advisory Given   Pulmonary former smoker,  breath sounds clear to auscultation  Pulmonary exam normal       Cardiovascular hypertension, Pt. on medications Rhythm:Regular Rate:Normal     Neuro/Psych PSYCHIATRIC DISORDERS Anxiety negative psych ROS   GI/Hepatic Neg liver ROS, GERD-  ,  Endo/Other  negative endocrine ROSdiabetes  Renal/GU negative Renal ROS  negative genitourinary   Musculoskeletal negative musculoskeletal ROS (+) Arthritis -,   Abdominal   Peds negative pediatric ROS (+)  Hematology negative hematology ROS (+)   Anesthesia Other Findings   Reproductive/Obstetrics negative OB ROS                             Anesthesia Physical  Anesthesia Plan  ASA: II  Anesthesia Plan: General   Post-op Pain Management:    Induction: Intravenous  Airway Management Planned: Oral ETT  Additional Equipment:   Intra-op Plan:   Post-operative Plan: Extubation in OR  Informed Consent: I have reviewed the patients History and Physical, chart, labs and discussed the procedure including the risks, benefits and alternatives for the proposed anesthesia with the patient or authorized representative who has indicated his/her understanding and acceptance.   Dental advisory given  Plan Discussed with: CRNA and Surgeon  Anesthesia Plan Comments:         Anesthesia Quick Evaluation

## 2014-04-23 NOTE — Anesthesia Postprocedure Evaluation (Signed)
Anesthesia Post Note  Patient: [name]  Procedure(s) Performed: Procedure(s) (LRB): Right L5-S1 Laminectomy/Foraminotomy (Right)  Anesthesia type: general  Patient location: PACU  Post pain: Pain level controlled  Post assessment: Patient's Cardiovascular Status Stable  Last Vitals:  Filed Vitals:   04/23/14 2130  BP: 125/60  Pulse: 59  Temp:   Resp: 27    Post vital signs: Reviewed and stable  Level of consciousness: sedated  Complications: No apparent anesthesia complications

## 2014-04-23 NOTE — Transfer of Care (Signed)
Immediate Anesthesia Transfer of Care Note  Patient: [name]  Procedure(s) Performed: Procedure(s) with comments: Right L5-S1 Laminectomy/Foraminotomy (Right) - Right L5-S1 Laminectomy/Foraminotomy  Patient Location: PACU  Anesthesia Type:General  Level of Consciousness: awake  Airway & Oxygen Therapy: Patient Spontanous Breathing and Patient connected to nasal cannula oxygen  Post-op Assessment: Report given to PACU RN and Post -op Vital signs reviewed and stable  Post vital signs: Reviewed and stable  Complications: No apparent anesthesia complications

## 2014-04-24 ENCOUNTER — Encounter (HOSPITAL_COMMUNITY): Payer: Self-pay | Admitting: Neurosurgery

## 2014-04-24 NOTE — Discharge Summary (Signed)
Physician Discharge Summary  Patient ID: [name] MRN: [id number] DOB/AGE: Aug 31, 1955 58 y.o.  Admit date: 04/23/2014 Discharge date: 04/24/2014  Admission Diagnoses:lumbar radiculopathy  stenosis  Discharge Diagnoses:  Active Problems:   Foraminal stenosis of lumbar region   Discharged Condition:no pain  Hospital Course: surgery  Consults: none  Significant Diagnostic Studies: myelogram  Treatments: right l5s1 foraminotomy  Discharge Exam: Blood pressure 108/62, pulse 60, temperature 97.7 F (36.5 C), temperature source Oral, resp. rate 16, height 5\' 3"  (1.6 m), weight 98.884 kg (218 lb), SpO2 98 %. No weakness  Disposition:home     Medication List    ASK your doctor about these medications        aspirin 81 MG tablet  Take 81 mg by mouth daily.     atorvastatin 20 MG tablet  Commonly known as:  LIPITOR  Take 1 tablet (20 mg total) by mouth every evening.     B-D TB SYRINGE 1CC/27GX1/2" 27G X 1/2" 1 ML Misc  Generic drug:  TUBERCULIN SYR 1CC/27GX1/2"     citalopram 20 MG tablet  Commonly known as:  CELEXA  Take 1 tablet (20 mg total) by mouth daily.     cyclobenzaprine 10 MG tablet  Commonly known as:  FLEXERIL  Take 1 tablet (10 mg total) by mouth 3 (three) times daily as needed for muscle spasms.     folic acid 1 MG tablet  Commonly known as:  FOLVITE  Take 3 mg by mouth daily.     gabapentin 300 MG capsule  Commonly known as:  NEURONTIN  Take 300 mg by mouth 2 (two) times daily.     gabapentin 100 MG capsule  Commonly known as:  NEURONTIN     hydrochlorothiazide 25 MG tablet  Commonly known as:  HYDRODIURIL  Take 1 tablet (25 mg total) by mouth daily.     loratadine 10 MG tablet  Commonly known as:  CLARITIN  Take 1 tablet (10 mg total) by mouth daily.     Methotrexate Sodium (PF) 50 MG/2ML Soln  Inject 60 mg as directed once a week.     naproxen 500 MG tablet  Commonly known as:  NAPROSYN  Take 500 mg by mouth 2 (two) times daily  with a meal.     NAPROXEN DR 500 MG EC tablet  Generic drug:  naproxen     omeprazole 20 MG capsule  Commonly known as:  PRILOSEC  Take 20 mg by mouth daily.     omeprazole 20 MG capsule  Commonly known as:  PRILOSEC  TAKE 1 CAPSULE BY MOUTH DAILY FOR POSSIBLE ACID REFLUX.     ondansetron 4 MG disintegrating tablet  Commonly known as:  ZOFRAN-ODT  DISSOLVE 1 TABLET ON TONGUE EVERY 8 HOURS AS NEEDED FOR NAUSEA OR VOMITING.     oxycodone-acetaminophen 2.5-325 MG per tablet  Commonly known as:  PERCOCET  Take 2 tablets by mouth every 4 (four) hours as needed for pain.     oxyCODONE-acetaminophen 5-325 MG per tablet  Commonly known as:  PERCOCET  Take 1-2 tablets by mouth every 4 (four) hours as needed.     potassium bicarbonate 25 MEQ disintegrating tablet  Commonly known as:  KLOR-CON/EF  Take 1 tablet (25 mEq total) by mouth daily.     predniSONE 20 MG tablet  Commonly known as:  DELTASONE  2 tabs daily x 5 days     propranolol 80 MG tablet  Commonly known as:  INDERAL  Take 80 mg  by mouth daily.     propranolol 80 MG tablet  Commonly known as:  INDERAL  TAKE 1 TABLET BY MOUTH DAILY.     traMADol 50 MG tablet  Commonly known as:  ULTRAM  Take 1-2 tablets (50-100 mg total) by mouth at bedtime as needed.     traZODone 100 MG tablet  Commonly known as:  DESYREL  Take 1 -2 tablets every night as needed for sleep.         Signed: Karn Cassis 04/24/2014, 10:00 AM

## 2014-04-24 NOTE — Progress Notes (Signed)
Patient arrived to 4N08 at 0000 alert and oriented from PACU.

## 2014-04-24 NOTE — Progress Notes (Signed)
Patient d/c home, d/c instruction given and patient verbalized understanding.

## 2014-04-24 NOTE — Evaluation (Signed)
Physical Therapy Evaluation Patient Details Name: [name] MRN: [id number] DOB: 20-Dec-1955 Today's Date: 04/24/2014   History of Present Illness  Patient is a 58 y/o female s/p Right L5-S1 laminotomy and foraminotomy.  Decompression of L5 and S1 nerve root.  Clinical Impression  Patient presents with pain and mild balance deficits limiting mobility. Tolerated ambulation and transfers at supervision level. Education provided on back precautions. Pt would benefit from skilled PT to improve overall functional mobility so pt can return to PLOF.    Follow Up Recommendations No PT follow up;Supervision - Intermittent    Equipment Recommendations  None recommended by PT    Recommendations for Other Services       Precautions / Restrictions Precautions Precautions: None;Back Precaution Booklet Issued: No Precaution Comments: Educated on 3/3 back precautions. Restrictions Weight Bearing Restrictions: No      Mobility  Bed Mobility Overal bed mobility: Modified Independent             General bed mobility comments: HOB flat, no rails to simulate home environment.  Transfers Overall transfer level: Needs assistance Equipment used: None Transfers: Sit to/from Stand Sit to Stand: Supervision         General transfer comment: Supervision for safety. Cues to adhere to back precautions. Stood from [employer], from toilet x1. transferred to recliner.  Ambulation/Gait Ambulation/Gait assistance: Supervision Ambulation Distance (Feet): 200 Feet Assistive device: None Gait Pattern/deviations: Step-through pattern;Decreased stride length   Gait velocity interpretation: Below normal speed for age/gender General Gait Details: Pt with slow and steady gait. Cues to adhere to back precautions.  Stairs Stairs: Yes Stairs assistance: Supervision Stair Management: Two rails Number of Stairs: 3 (+2 steps x2 bouts) General stair comments: Supervision for safety.   Wheelchair  Mobility    Modified Rankin (Stroke Patients Only)       Balance Overall balance assessment: Needs assistance Sitting-balance support: Feet supported;No upper extremity supported Sitting balance-Leahy Scale: Good     Standing balance support: During functional activity Standing balance-Leahy Scale: Good                               Pertinent Vitals/Pain Pain Assessment: 0-10 Pain Score: 5  Pain Location: back at surgical site Pain Descriptors / Indicators: Sore;Aching Pain Intervention(s): Monitored during session;Repositioned    Home Living Family/patient expects to be discharged to:: Private residence Living Arrangements: Non-relatives/Friends Available Help at Discharge: Friend(s);Available PRN/intermittently Type of Home: House Home Access: Stairs to enter Entrance Stairs-Rails: Right Entrance Stairs-Number of Steps: 2+4+5 Home Layout: One level Home Equipment: Walker - 2 wheels      Prior Function Level of Independence: Independent         Comments: Pt works as IT consultant. Independent with all care. Drives.     Hand Dominance        Extremity/Trunk Assessment   Upper Extremity Assessment: Overall WFL for tasks assessed;Defer to OT evaluation           Lower Extremity Assessment: Overall WFL for tasks assessed      Cervical / Trunk Assessment: Normal  Communication   Communication: No difficulties  Cognition Arousal/Alertness: Awake/alert Behavior During Therapy: WFL for tasks assessed/performed Overall Cognitive Status: Within Functional Limits for tasks assessed       Memory:  (Able to recall 3/3 back precautions at end of session.)              General Comments  Exercises        Assessment/Plan    PT Assessment Patient needs continued PT services  PT Diagnosis Acute pain   PT Problem List Decreased strength;Pain;Decreased knowledge of precautions;Decreased mobility  PT Treatment Interventions Balance  training;Gait training;Stair training;Patient/family education;Therapeutic exercise;Therapeutic activities;Functional mobility training   PT Goals (Current goals can be found in the Care Plan section) Acute Rehab PT Goals Patient Stated Goal: to return home for xmas PT Goal Formulation: With patient Time For Goal Achievement: 05/08/14 Potential to Achieve Goals: Good    Frequency Min 5X/week   Barriers to discharge        Co-evaluation               End of Session Equipment Utilized During Treatment: Gait belt Activity Tolerance: Patient tolerated treatment well Patient left: in chair;with call bell/phone within reach Nurse Communication: Mobility status         Time: 4562-5638 PT Time Calculation (min) (ACUTE ONLY): 15 min   Charges:   PT Evaluation $Initial PT Evaluation Tier I: 1 Procedure PT Treatments $Gait Training: 8-22 mins   PT G CodesAlvie Heidelberg A 04/24/2014, 9:54 AM  Alvie Heidelberg, PT, DPT [phone]

## 2014-04-24 NOTE — Op Note (Signed)
NAMECASSANDA, [name]                  ACCOUNT NO.:  [id number]  MEDICAL RECORD NO.:  [id number]  LOCATION:  4N08C                        FACILITY:  [facility]  PHYSICIAN:  Hilda Lias, M.D.   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  POSTOPERATIVE DIAGNOSIS:  Right L5-S1 foraminal stenosis secondary to hypertrophied facet with chronic radiculopathy.  PROCEDURE:  Right L5-S1 laminotomy and foraminotomy.  Decompression of the L5 and S1 nerve root.  Microscope.  SURGEON:  Hilda Lias, M.D.  ASSISTANT:  Stefani Dama, M.D.  CLINICAL HISTORY:  The patient had been complaining of back pain worsened to the right leg.  She had been in the emergency room on several occasions.  The medication has been of no help.  She has an MRI back in September and myelogram recently.  EMG showed radiculopathy affecting the right L5-S1.  The myelogram showed that she has severe stenosis secondary to facet hypertrophy at L5-S1 to the right.  She has extraforaminal osteophyte but there is no compromise of the L5 nerve root that we can see.  The patient is no any better with medication and we agreed because of the findings in the myelogram and the EMG to proceed with surgery.  She knew the risk and the benefits of surgery knowing that there is a possibility she required more.  PROCEDURE:  The patient was taken to the OR, and after intubation, she was positioned in a prone manner.  The back was cleaned with DuraPrep and Betadine.  Drapes were applied.  The x-rays showed that we were right at the L5-S1.  Incision was made in the skin through the subcutaneous tissue, through a thick adipose tissue straight down to the lumbar spine.  This attached the muscle in the right side and the second x-ray showed that  indeed we were right at the level 5-1.  We brought the microscope with the drill.  We drilled the lower lamina of L5, the upper S1, and the medial facet.  The yellow ligament was also excised.  What we found immediately that the patient has quite a bit of hypertrophy of the facet with quite a bit of compromising of the S1 nerve root.  Using the 1, 2, and 3 mm Kerrison punch, we did foraminotomy to decompress the S1 nerve root.  Then, we went down to the L5 area and we did a foraminotomy to decompress the L5.  The foramen was open for the L5.  At the end, we had plenty of space for the thecal sac both at the L5 and S1 nerve root.  The area was irrigated.  Valsalva maneuver was negative.  Fentanyl and Depo-Medrol were left in the epidural space and the wound was closed with Vicryl and Steri-Strips.          ______________________________ Hilda Lias, M.D.     EB/MEDQ  D:  04/23/2014  T:  04/24/2014  Job:  [id number]

## 2014-05-01 ENCOUNTER — Ambulatory Visit (HOSPITAL_COMMUNITY): Payer: BLUE CROSS/BLUE SHIELD | Admitting: Anesthesiology

## 2014-05-01 ENCOUNTER — Ambulatory Visit (HOSPITAL_COMMUNITY): Payer: BLUE CROSS/BLUE SHIELD

## 2014-05-01 ENCOUNTER — Inpatient Hospital Stay (HOSPITAL_COMMUNITY)
Admission: AD | Admit: 2014-05-01 | Discharge: 2014-05-06 | DRG: 982 | Disposition: A | Discharge status: Home / Self Care | Payer: BLUE CROSS/BLUE SHIELD | Source: Ambulatory Visit | Attending: Neurosurgery | Admitting: Neurosurgery

## 2014-05-01 ENCOUNTER — Other Ambulatory Visit: Payer: Self-pay | Admitting: Neurosurgery

## 2014-05-01 ENCOUNTER — Encounter (HOSPITAL_COMMUNITY)
Admission: AD | Disposition: A | Discharge status: Home / Self Care | Payer: Self-pay | Source: Ambulatory Visit | Attending: Neurosurgery

## 2014-05-01 ENCOUNTER — Encounter (HOSPITAL_COMMUNITY): Payer: Self-pay | Admitting: *Deleted

## 2014-05-01 DIAGNOSIS — Z90710 Acquired absence of both cervix and uterus: Secondary | ICD-10-CM

## 2014-05-01 DIAGNOSIS — Z7982 Long term (current) use of aspirin: Secondary | ICD-10-CM

## 2014-05-01 DIAGNOSIS — Z9049 Acquired absence of other specified parts of digestive tract: Secondary | ICD-10-CM | POA: Diagnosis present

## 2014-05-01 DIAGNOSIS — M4806 Spinal stenosis, lumbar region: Secondary | ICD-10-CM | POA: Diagnosis present

## 2014-05-01 DIAGNOSIS — Z6838 Body mass index (BMI) 38.0-38.9, adult: Secondary | ICD-10-CM | POA: Diagnosis not present

## 2014-05-01 DIAGNOSIS — Z885 Allergy status to narcotic agent status: Secondary | ICD-10-CM | POA: Diagnosis not present

## 2014-05-01 DIAGNOSIS — F419 Anxiety disorder, unspecified: Secondary | ICD-10-CM | POA: Diagnosis present

## 2014-05-01 DIAGNOSIS — R51 Headache: Secondary | ICD-10-CM | POA: Diagnosis present

## 2014-05-01 DIAGNOSIS — G9619 Other disorders of meninges, not elsewhere classified: Principal | ICD-10-CM | POA: Diagnosis present

## 2014-05-01 DIAGNOSIS — G4700 Insomnia, unspecified: Secondary | ICD-10-CM | POA: Diagnosis present

## 2014-05-01 DIAGNOSIS — M069 Rheumatoid arthritis, unspecified: Secondary | ICD-10-CM | POA: Diagnosis present

## 2014-05-01 DIAGNOSIS — E785 Hyperlipidemia, unspecified: Secondary | ICD-10-CM | POA: Diagnosis present

## 2014-05-01 DIAGNOSIS — Z01818 Encounter for other preprocedural examination: Secondary | ICD-10-CM

## 2014-05-01 DIAGNOSIS — M5410 Radiculopathy, site unspecified: Secondary | ICD-10-CM | POA: Diagnosis present

## 2014-05-01 DIAGNOSIS — G43909 Migraine, unspecified, not intractable, without status migrainosus: Secondary | ICD-10-CM | POA: Diagnosis present

## 2014-05-01 DIAGNOSIS — E669 Obesity, unspecified: Secondary | ICD-10-CM | POA: Diagnosis present

## 2014-05-01 DIAGNOSIS — R519 Headache, unspecified: Secondary | ICD-10-CM

## 2014-05-01 DIAGNOSIS — I10 Essential (primary) hypertension: Secondary | ICD-10-CM | POA: Diagnosis present

## 2014-05-01 DIAGNOSIS — G960 Cerebrospinal fluid leak: Secondary | ICD-10-CM | POA: Diagnosis present

## 2014-05-01 DIAGNOSIS — G9782 Other postprocedural complications and disorders of nervous system: Secondary | ICD-10-CM | POA: Diagnosis present

## 2014-05-01 DIAGNOSIS — E876 Hypokalemia: Secondary | ICD-10-CM

## 2014-05-01 DIAGNOSIS — Z87891 Personal history of nicotine dependence: Secondary | ICD-10-CM

## 2014-05-01 DIAGNOSIS — J302 Other seasonal allergic rhinitis: Secondary | ICD-10-CM | POA: Diagnosis present

## 2014-05-01 DIAGNOSIS — K219 Gastro-esophageal reflux disease without esophagitis: Secondary | ICD-10-CM | POA: Diagnosis present

## 2014-05-01 HISTORY — PX: LUMBAR WOUND DEBRIDEMENT: SHX1988

## 2014-05-01 LAB — CBC
HCT: 40.9 % (ref 36.0–46.0)
Hemoglobin: 13.3 g/dL (ref 12.0–15.0)
MCH: 30.3 pg (ref 26.0–34.0)
MCHC: 32.5 g/dL (ref 30.0–36.0)
MCV: 93.2 fL (ref 78.0–100.0)
Platelets: 359 10*3/uL (ref 150–400)
RBC: 4.39 MIL/uL (ref 3.87–5.11)
RDW: 14.8 % (ref 11.5–15.5)
WBC: 9.3 10*3/uL (ref 4.0–10.5)

## 2014-05-01 LAB — BASIC METABOLIC PANEL
Anion gap: 8 (ref 5–15)
BUN: 15 mg/dL (ref 6–23)
CALCIUM: 9 mg/dL (ref 8.4–10.5)
CHLORIDE: 100 meq/L (ref 96–112)
CO2: 30 mmol/L (ref 19–32)
CREATININE: 0.81 mg/dL (ref 0.50–1.10)
GFR calc non Af Amer: 77 mL/min — ABNORMAL LOW (ref >90)
GFR, EST AFRICAN AMERICAN: 90 mL/min — AB (ref >90)
Glucose, Bld: 88 mg/dL (ref 70–99)
Potassium: 3.2 mmol/L — ABNORMAL LOW (ref 3.5–5.1)
Sodium: 138 mmol/L (ref 135–145)

## 2014-05-01 LAB — GLUCOSE, CAPILLARY: Glucose-Capillary: 111 mg/dL — ABNORMAL HIGH (ref 70–99)

## 2014-05-01 SURGERY — LUMBAR WOUND DEBRIDEMENT
Anesthesia: General | Site: Spine Lumbar

## 2014-05-01 MED ORDER — ACETAMINOPHEN 650 MG RE SUPP: 650.0000 mg | RECTAL | Status: DC | PRN

## 2014-05-01 MED ORDER — MORPHINE SULFATE 2 MG/ML IJ SOLN
1.0000 mg | INTRAMUSCULAR | Status: DC | PRN
  Administered 2014-05-01 – 2014-05-02 (×2): 2 mg via INTRAVENOUS
  Filled 2014-05-01 (×2): qty 1

## 2014-05-01 MED ORDER — SUCCINYLCHOLINE CHLORIDE 20 MG/ML IJ SOLN
INTRAMUSCULAR | Status: AC
  Filled 2014-05-01: qty 1

## 2014-05-01 MED ORDER — PHENYLEPHRINE HCL 10 MG/ML IJ SOLN
INTRAMUSCULAR | Status: DC | PRN
  Administered 2014-05-01: 120 ug via INTRAVENOUS

## 2014-05-01 MED ORDER — DIAZEPAM 5 MG PO TABS
ORAL_TABLET | ORAL | Status: AC
  Filled 2014-05-01: qty 2

## 2014-05-01 MED ORDER — PROPOFOL 10 MG/ML IV BOLUS
INTRAVENOUS | Status: AC
  Filled 2014-05-01: qty 20

## 2014-05-01 MED ORDER — SODIUM CHLORIDE 0.9 % IV SOLN
INTRAVENOUS | Status: DC
  Administered 2014-05-01: 20:00:00 via INTRAVENOUS

## 2014-05-01 MED ORDER — FENTANYL CITRATE 0.05 MG/ML IJ SOLN
INTRAMUSCULAR | Status: AC
  Filled 2014-05-01: qty 5

## 2014-05-01 MED ORDER — OXYCODONE-ACETAMINOPHEN 5-325 MG PO TABS
1.0000 | ORAL_TABLET | ORAL | Status: DC | PRN
  Administered 2014-05-02 (×2): 2 via ORAL
  Filled 2014-05-01 (×2): qty 2

## 2014-05-01 MED ORDER — PHENOL 1.4 % MT LIQD
1.0000 | OROMUCOSAL | Status: DC | PRN
  Administered 2014-05-03: 1 via OROMUCOSAL
  Filled 2014-05-01: qty 177

## 2014-05-01 MED ORDER — HYDROMORPHONE HCL 1 MG/ML IJ SOLN
0.2500 mg | INTRAMUSCULAR | Status: DC | PRN
  Administered 2014-05-01: 0.5 mg via INTRAVENOUS

## 2014-05-01 MED ORDER — LACTATED RINGERS IV SOLN
INTRAVENOUS | Status: DC
  Administered 2014-05-01: 14:00:00 via INTRAVENOUS

## 2014-05-01 MED ORDER — DIAZEPAM 5 MG PO TABS
10.0000 mg | ORAL_TABLET | Freq: Four times a day (QID) | ORAL | Status: DC | PRN
  Administered 2014-05-01 – 2014-05-04 (×4): 10 mg via ORAL
  Filled 2014-05-01 (×3): qty 2

## 2014-05-01 MED ORDER — SUCCINYLCHOLINE CHLORIDE 20 MG/ML IJ SOLN
INTRAMUSCULAR | Status: DC | PRN
  Administered 2014-05-01: 100 mg via INTRAVENOUS

## 2014-05-01 MED ORDER — PROPRANOLOL HCL 80 MG PO TABS: 80.0000 mg | ORAL_TABLET | Freq: Two times a day (BID) | ORAL | Status: DC

## 2014-05-01 MED ORDER — ROCURONIUM BROMIDE 50 MG/5ML IV SOLN
INTRAVENOUS | Status: AC
  Filled 2014-05-01: qty 1

## 2014-05-01 MED ORDER — TRAZODONE HCL 100 MG PO TABS
100.0000 mg | ORAL_TABLET | Freq: Every day | ORAL | Status: DC
  Administered 2014-05-01 – 2014-05-05 (×5): 100 mg via ORAL
  Filled 2014-05-01 (×5): qty 1

## 2014-05-01 MED ORDER — SCOPOLAMINE 1 MG/3DAYS TD PT72
MEDICATED_PATCH | TRANSDERMAL | Status: AC
  Administered 2014-05-01: 1 via TRANSDERMAL
  Filled 2014-05-01: qty 1

## 2014-05-01 MED ORDER — ACETAMINOPHEN 325 MG PO TABS: 650.0000 mg | ORAL_TABLET | ORAL | Status: DC | PRN

## 2014-05-01 MED ORDER — HEMOSTATIC AGENTS (NO CHARGE) OPTIME
TOPICAL | Status: DC | PRN
  Administered 2014-05-01: 1 via TOPICAL

## 2014-05-01 MED ORDER — LIDOCAINE HCL (CARDIAC) 20 MG/ML IV SOLN
INTRAVENOUS | Status: AC
  Filled 2014-05-01: qty 5

## 2014-05-01 MED ORDER — ASPIRIN EC 81 MG PO TBEC
81.0000 mg | DELAYED_RELEASE_TABLET | Freq: Every day | ORAL | Status: DC
  Administered 2014-05-02 – 2014-05-05 (×4): 81 mg via ORAL
  Filled 2014-05-01 (×9): qty 1

## 2014-05-01 MED ORDER — SODIUM CHLORIDE 0.9 % IJ SOLN: 3.0000 mL | INTRAMUSCULAR | Status: DC | PRN

## 2014-05-01 MED ORDER — ONDANSETRON HCL 4 MG/2ML IJ SOLN
INTRAMUSCULAR | Status: DC | PRN
  Administered 2014-05-01: 4 mg via INTRAVENOUS

## 2014-05-01 MED ORDER — THROMBIN 5000 UNITS EX SOLR
CUTANEOUS | Status: DC | PRN
  Administered 2014-05-01 (×2): 5000 [IU] via TOPICAL

## 2014-05-01 MED ORDER — SODIUM CHLORIDE 0.9 % IV SOLN: 250.0000 mL | INTRAVENOUS | Status: DC

## 2014-05-01 MED ORDER — LACTATED RINGERS IV SOLN
INTRAVENOUS | Status: DC | PRN
  Administered 2014-05-01: 16:00:00 via INTRAVENOUS

## 2014-05-01 MED ORDER — ONDANSETRON HCL 4 MG/2ML IJ SOLN: 4.0000 mg | INTRAMUSCULAR | Status: DC | PRN

## 2014-05-01 MED ORDER — ONDANSETRON HCL 4 MG/2ML IJ SOLN
INTRAMUSCULAR | Status: AC
  Filled 2014-05-01: qty 2

## 2014-05-01 MED ORDER — PROPRANOLOL HCL 80 MG PO TABS
80.0000 mg | ORAL_TABLET | Freq: Every day | ORAL | Status: DC
  Administered 2014-05-02: 80 mg via ORAL
  Filled 2014-05-01 (×2): qty 1

## 2014-05-01 MED ORDER — DEXAMETHASONE SODIUM PHOSPHATE 10 MG/ML IJ SOLN
INTRAMUSCULAR | Status: DC | PRN
  Administered 2014-05-01: 10 mg via INTRAVENOUS

## 2014-05-01 MED ORDER — 0.9 % SODIUM CHLORIDE (POUR BTL) OPTIME
TOPICAL | Status: DC | PRN
  Administered 2014-05-01: 1000 mL

## 2014-05-01 MED ORDER — PREDNISONE 20 MG PO TABS
20.0000 mg | ORAL_TABLET | Freq: Every day | ORAL | Status: DC
  Administered 2014-05-02 – 2014-05-06 (×5): 20 mg via ORAL
  Filled 2014-05-01 (×5): qty 1

## 2014-05-01 MED ORDER — GABAPENTIN 300 MG PO CAPS
300.0000 mg | ORAL_CAPSULE | Freq: Two times a day (BID) | ORAL | Status: DC
  Administered 2014-05-01 – 2014-05-05 (×9): 300 mg via ORAL
  Filled 2014-05-01 (×9): qty 1

## 2014-05-01 MED ORDER — LIDOCAINE HCL (CARDIAC) 20 MG/ML IV SOLN
INTRAVENOUS | Status: DC | PRN
  Administered 2014-05-01: 60 mg via INTRAVENOUS

## 2014-05-01 MED ORDER — CEFAZOLIN SODIUM 1-5 GM-% IV SOLN
1.0000 g | Freq: Three times a day (TID) | INTRAVENOUS | Status: AC
  Administered 2014-05-02 (×2): 1 g via INTRAVENOUS
  Filled 2014-05-01 (×2): qty 50

## 2014-05-01 MED ORDER — EPHEDRINE SULFATE 50 MG/ML IJ SOLN
INTRAMUSCULAR | Status: DC | PRN
  Administered 2014-05-01: 5 mg via INTRAVENOUS
  Administered 2014-05-01 (×2): 10 mg via INTRAVENOUS

## 2014-05-01 MED ORDER — SODIUM CHLORIDE 0.9 % IJ SOLN
3.0000 mL | Freq: Two times a day (BID) | INTRAMUSCULAR | Status: DC
  Administered 2014-05-01 – 2014-05-04 (×3): 3 mL via INTRAVENOUS

## 2014-05-01 MED ORDER — CEFAZOLIN SODIUM-DEXTROSE 2-3 GM-% IV SOLR
INTRAVENOUS | Status: DC | PRN
  Administered 2014-05-01: 2 g via INTRAVENOUS

## 2014-05-01 MED ORDER — HYDROCHLOROTHIAZIDE 25 MG PO TABS
25.0000 mg | ORAL_TABLET | Freq: Every day | ORAL | Status: DC
  Administered 2014-05-02 – 2014-05-05 (×4): 25 mg via ORAL
  Filled 2014-05-01 (×4): qty 1

## 2014-05-01 MED ORDER — MIDAZOLAM HCL 2 MG/2ML IJ SOLN
INTRAMUSCULAR | Status: AC
  Filled 2014-05-01: qty 2

## 2014-05-01 MED ORDER — GLYCOPYRROLATE 0.2 MG/ML IJ SOLN
INTRAMUSCULAR | Status: DC | PRN
  Administered 2014-05-01: 0.2 mg via INTRAVENOUS

## 2014-05-01 MED ORDER — CITALOPRAM HYDROBROMIDE 20 MG PO TABS
20.0000 mg | ORAL_TABLET | Freq: Every day | ORAL | Status: DC
  Administered 2014-05-02 – 2014-05-05 (×4): 20 mg via ORAL
  Filled 2014-05-01: qty 2
  Filled 2014-05-01 (×2): qty 1
  Filled 2014-05-01 (×3): qty 2
  Filled 2014-05-01 (×3): qty 1

## 2014-05-01 MED ORDER — ATORVASTATIN CALCIUM 20 MG PO TABS
20.0000 mg | ORAL_TABLET | Freq: Every evening | ORAL | Status: DC
  Administered 2014-05-02 – 2014-05-05 (×4): 20 mg via ORAL
  Filled 2014-05-01: qty 2
  Filled 2014-05-01: qty 1
  Filled 2014-05-01: qty 2
  Filled 2014-05-01 (×2): qty 1
  Filled 2014-05-01: qty 2
  Filled 2014-05-01: qty 1
  Filled 2014-05-01: qty 2
  Filled 2014-05-01 (×2): qty 1

## 2014-05-01 MED ORDER — METHOTREXATE SODIUM CHEMO INJECTION (PF) 50 MG/2ML: 60.0000 mg | INTRAMUSCULAR | Status: DC

## 2014-05-01 MED ORDER — HYDROMORPHONE HCL 1 MG/ML IJ SOLN
INTRAMUSCULAR | Status: AC
  Filled 2014-05-01: qty 1

## 2014-05-01 MED ORDER — FENTANYL CITRATE 0.05 MG/ML IJ SOLN
INTRAMUSCULAR | Status: DC | PRN
  Administered 2014-05-01: 100 ug via INTRAVENOUS

## 2014-05-01 MED ORDER — MENTHOL 3 MG MT LOZG
1.0000 | LOZENGE | OROMUCOSAL | Status: DC | PRN
  Administered 2014-05-02: 3 mg via ORAL
  Filled 2014-05-01: qty 9

## 2014-05-01 MED ORDER — PROPOFOL 10 MG/ML IV BOLUS
INTRAVENOUS | Status: DC | PRN
  Administered 2014-05-01: 200 mg via INTRAVENOUS

## 2014-05-01 SURGICAL SUPPLY — 43 items
BENZOIN TINCTURE PRP APPL 2/3 (GAUZE/BANDAGES/DRESSINGS) IMPLANT
BLADE CLIPPER SURG (BLADE) IMPLANT
CANISTER SUCT 3000ML (MISCELLANEOUS) ×2 IMPLANT
DRAPE LAPAROTOMY 100X72X124 (DRAPES) ×2 IMPLANT
DRAPE POUCH INSTRU U-SHP 10X18 (DRAPES) ×2 IMPLANT
DRSG OPSITE POSTOP 4X6 (GAUZE/BANDAGES/DRESSINGS) ×2 IMPLANT
DURAPREP 26ML APPLICATOR (WOUND CARE) ×2 IMPLANT
DURASEAL SPINE SEALANT 3ML (MISCELLANEOUS) ×2 IMPLANT
ELECT REM PT RETURN 9FT ADLT (ELECTROSURGICAL) ×2
ELECTRODE REM PT RTRN 9FT ADLT (ELECTROSURGICAL) ×1 IMPLANT
GAUZE SPONGE 4X4 12PLY STRL (GAUZE/BANDAGES/DRESSINGS) ×2 IMPLANT
GAUZE SPONGE 4X4 16PLY XRAY LF (GAUZE/BANDAGES/DRESSINGS) IMPLANT
GLOVE BIO SURGEON STRL SZ 6.5 (GLOVE) ×6 IMPLANT
GLOVE BIOGEL M 8.0 STRL (GLOVE) ×2 IMPLANT
GLOVE BIOGEL PI IND STRL 7.0 (GLOVE) ×1 IMPLANT
GLOVE BIOGEL PI INDICATOR 7.0 (GLOVE) ×1
GLOVE EXAM NITRILE LRG STRL (GLOVE) IMPLANT
GLOVE EXAM NITRILE MD LF STRL (GLOVE) IMPLANT
GLOVE EXAM NITRILE XL STR (GLOVE) IMPLANT
GOWN STRL REUS W/ TWL LRG LVL3 (GOWN DISPOSABLE) ×2 IMPLANT
GOWN STRL REUS W/ TWL XL LVL3 (GOWN DISPOSABLE) IMPLANT
GOWN STRL REUS W/TWL 2XL LVL3 (GOWN DISPOSABLE) IMPLANT
GOWN STRL REUS W/TWL LRG LVL3 (GOWN DISPOSABLE) ×2
GOWN STRL REUS W/TWL XL LVL3 (GOWN DISPOSABLE)
KIT BASIN OR (CUSTOM PROCEDURE TRAY) ×2 IMPLANT
KIT ROOM TURNOVER OR (KITS) ×2 IMPLANT
NS IRRIG 1000ML POUR BTL (IV SOLUTION) ×2 IMPLANT
PACK LAMINECTOMY NEURO (CUSTOM PROCEDURE TRAY) ×2 IMPLANT
PAD ARMBOARD 7.5X6 YLW CONV (MISCELLANEOUS) ×6 IMPLANT
SPONGE SURGIFOAM ABS GEL SZ50 (HEMOSTASIS) ×2 IMPLANT
STAPLER VISISTAT 35W (STAPLE) ×2 IMPLANT
STRIP CLOSURE SKIN 1/2X4 (GAUZE/BANDAGES/DRESSINGS) ×2 IMPLANT
SUT PROLENE 6 0 BV (SUTURE) ×2 IMPLANT
SUT VIC AB 0 CT1 18XCR BRD8 (SUTURE) ×1 IMPLANT
SUT VIC AB 0 CT1 8-18 (SUTURE) ×1
SUT VIC AB 2-0 CP2 18 (SUTURE) ×2 IMPLANT
SUT VIC AB 3-0 SH 8-18 (SUTURE) ×2 IMPLANT
SWAB COLLECTION DEVICE MRSA (MISCELLANEOUS) ×2 IMPLANT
SYR 20ML ECCENTRIC (SYRINGE) ×2 IMPLANT
TOWEL OR 17X24 6PK STRL BLUE (TOWEL DISPOSABLE) ×2 IMPLANT
TOWEL OR 17X26 10 PK STRL BLUE (TOWEL DISPOSABLE) ×2 IMPLANT
TUBE ANAEROBIC SPECIMEN COL (MISCELLANEOUS) ×2 IMPLANT
WATER STERILE IRR 1000ML POUR (IV SOLUTION) ×2 IMPLANT

## 2014-05-01 NOTE — H&P (Signed)
[name] is an 58 y.o. female.   Chief Complaint: headache HPI: patient who underwent lumbar foraminotomy few days back secondary to stenosis . The sciatica pain went away but for 2 days she is complaining of positional heacache with the wound normal.  Past Medical History  Diagnosis Date  . PONV (postoperative nausea and vomiting)     violent nausea/vomiting  . Hyperlipidemia     takes Atorvastatin daily  . Obesity   . Arthritis   . Anxiety     takes Citalopram daily  . Muscle spasm of back     takes Flexeril daily as needed  . Seasonal allergies     takes Claritin daily  . GERD (gastroesophageal reflux disease)     takes Omeprazole daily  . Hypertension     takes HCTZ daily as well as Propranolol  . Pneumonia     hx of-many,many yrs ago  . History of bronchitis     last yr  . Migraine     hx of  . Rheumatoid arthritis   . Joint pain   . Joint swelling   . Chronic back pain     radiculopathy  . Urinary urgency   . Diabetes mellitus without complication     "pre"  . Insomnia     takes trazodone nightly  . History of shingles     Past Surgical History  Procedure Laterality Date  . Back surgery    . Abdominal hysterectomy  2003  . Cholecystectomy N/A 06/27/2012    Procedure: LAPAROSCOPIC CHOLECYSTECTOMY WITH INTRAOPERATIVE CHOLANGIOGRAM;  Surgeon: Currie Paris, MD;  Location: [facility];  Service: General;  Laterality: N/A;  . Carpal tunnel release Bilateral   . Neck surgery    . Colonoscopy    . Lumbar laminectomy/decompression microdiscectomy Right 04/23/2014    Procedure: Right L5-S1 Laminectomy/Foraminotomy;  Surgeon: Karn Cassis, MD;  Location: [facility];  Service: Neurosurgery;  Laterality: Right;  Right L5-S1 Laminectomy/Foraminotomy    Family History  Problem Relation Age of Onset  . Leukemia Father   . Cancer Father   . Diabetes Maternal Grandmother   . Heart disease Paternal Grandmother   . Colon cancer Paternal Aunt 17  . Rheum  arthritis Mother   . Heart disease Mother     2 coronary stents  . Heart disease Maternal Uncle   . Heart disease Maternal Uncle    Social History:  reports that she has quit smoking. Her smoking use included Cigarettes. She smoked 0.00 packs per day. She has never used smokeless tobacco. She reports that she does not drink alcohol or use illicit drugs.  Allergies:  Allergies  Allergen Reactions  . Hydrocodone Itching    Medications Prior to Admission  Medication Sig Dispense Refill  . atorvastatin (LIPITOR) 20 MG tablet Take 1 tablet (20 mg total) by mouth every evening. 90 tablet 3  . citalopram (CELEXA) 20 MG tablet Take 1 tablet (20 mg total) by mouth daily. 90 tablet 3  . folic acid (FOLVITE) 1 MG tablet Take 3 mg by mouth daily.   1  . hydrochlorothiazide (HYDRODIURIL) 25 MG tablet Take 1 tablet (25 mg total) by mouth daily. 90 tablet 3  . loratadine (CLARITIN) 10 MG tablet Take 1 tablet (10 mg total) by mouth daily. 90 tablet 3  . Methotrexate Sodium, PF, 50 MG/2ML SOLN Inject 60 mg as directed once a week.    Marland Kitchen omeprazole (PRILOSEC) 20 MG capsule TAKE 1 CAPSULE BY MOUTH DAILY  FOR POSSIBLE ACID REFLUX. 30 capsule 1  . oxycodone-acetaminophen (PERCOCET) 2.5-325 MG per tablet Take 2 tablets by mouth every 4 (four) hours as needed for pain.    . potassium bicarbonate (KLOR-CON/EF) 25 MEQ disintegrating tablet Take 1 tablet (25 mEq total) by mouth daily. 30 tablet 3  . propranolol (INDERAL) 80 MG tablet Take 80 mg by mouth daily.    . traZODone (DESYREL) 100 MG tablet Take 1 -2 tablets every night as needed for sleep. 180 tablet 3  . aspirin 81 MG tablet Take 81 mg by mouth daily.    . B-D TB SYRINGE 1CC/27GX1/2" 27G X 1/2" 1 ML MISC   3  . cyclobenzaprine (FLEXERIL) 10 MG tablet Take 1 tablet (10 mg total) by mouth 3 (three) times daily as needed for muscle spasms. 15 tablet 0  . gabapentin (NEURONTIN) 100 MG capsule   3  . gabapentin (NEURONTIN) 300 MG capsule Take 300 mg by mouth  2 (two) times daily.    . naproxen (NAPROSYN) 500 MG tablet Take 500 mg by mouth 2 (two) times daily with a meal.    . NAPROXEN DR 500 MG EC tablet   2  . omeprazole (PRILOSEC) 20 MG capsule Take 20 mg by mouth daily.    . ondansetron (ZOFRAN-ODT) 4 MG disintegrating tablet DISSOLVE 1 TABLET ON TONGUE EVERY 8 HOURS AS NEEDED FOR NAUSEA OR VOMITING. 30 tablet 4  . oxyCODONE-acetaminophen (PERCOCET) 5-325 MG per tablet Take 1-2 tablets by mouth every 4 (four) hours as needed. 40 tablet 0  . predniSONE (DELTASONE) 20 MG tablet 2 tabs daily x 5 days 10 tablet 0  . propranolol (INDERAL) 80 MG tablet TAKE 1 TABLET BY MOUTH DAILY. 90 tablet 3  . traMADol (ULTRAM) 50 MG tablet Take 1-2 tablets (50-100 mg total) by mouth at bedtime as needed. 90 tablet 1    No results found for this or any previous visit (from the past 48 hour(s)). No results found.  Review of Systems  Constitutional: Negative.   HENT:       Positional headache  Cardiovascular: Negative.   Gastrointestinal: Negative.   Genitourinary: Negative.   Musculoskeletal: Positive for back pain.  Skin: Negative.   Neurological: Positive for headaches.  Endo/Heme/Allergies: Negative.   Psychiatric/Behavioral: Negative.     Blood pressure 134/61, pulse 52, temperature 97.7 F (36.5 C), temperature source Oral, resp. rate 16, height 5\' 3"  (1.6 m), weight 98.884 kg (218 lb), SpO2 97 %. Physical Exam hean, nl. Neck, no stiffness. Cv, nl. Lugs, clear. Abdomen, nl. Extremitiesd, nl. NEURO NORMAL. WOUND FLAT   Assessment/Plan   Exploration of lumbar wound , looking for source of csf leak. To have a ct lumbar prior to surgery Lumbar ct show epidural small fluid collection  Dylin Breeden M 05/01/2014, 1:35 PM

## 2014-05-01 NOTE — Progress Notes (Signed)
Add-on surgical patient for Dr. Jeral Fruit.   No orders in computers.

## 2014-05-01 NOTE — Anesthesia Procedure Notes (Signed)
Procedure Name: Intubation Date/Time: 05/01/2014 4:38 PM Performed by: Adonis Housekeeper Pre-anesthesia Checklist: Patient identified, Emergency Drugs available, Suction available and Patient being monitored Patient Re-evaluated:Patient Re-evaluated prior to inductionOxygen Delivery Method: Circle system utilized Preoxygenation: Pre-oxygenation with 100% oxygen Intubation Type: IV induction Ventilation: Mask ventilation without difficulty Laryngoscope Size: Miller and 3 Grade View: Grade II Tube type: Oral Tube size: 7.0 mm Number of attempts: 1 Airway Equipment and Method: Stylet Placement Confirmation: ETT inserted through vocal cords under direct vision,  positive ETCO2 and breath sounds checked- equal and bilateral Secured at: 21 cm Tube secured with: Tape Dental Injury: Teeth and Oropharynx as per pre-operative assessment

## 2014-05-01 NOTE — Progress Notes (Signed)
Received patient from PACU at 1905, admitted to 4N05. Report given by Holland Commons, RN. Post exploration of lumbar wound, looking for sourse of csf leak. Vital signs stable,moving all extremities.

## 2014-05-01 NOTE — Anesthesia Preprocedure Evaluation (Addendum)
Anesthesia Evaluation  Patient identified by MRN, date of birth, ID band Patient awake    Reviewed: Allergy & Precautions, H&P , NPO status , Patient's Chart, lab work & pertinent test results, reviewed documented beta blocker date and time   History of Anesthesia Complications (+) PONV  Airway Mallampati: II  TM Distance: >3 FB Neck ROM: Full    Dental no notable dental hx. (+) Teeth Intact, Dental Advisory Given   Pulmonary neg pulmonary ROS, former smoker,    Pulmonary exam normal       Cardiovascular hypertension, Pt. on medications and Pt. on home beta blockers     Neuro/Psych  Headaches, PSYCHIATRIC DISORDERS Anxiety negative psych ROS   GI/Hepatic Neg liver ROS, GERD-  Medicated and Controlled,  Endo/Other  diabetesMorbid obesity  Renal/GU negative Renal ROS  negative genitourinary   Musculoskeletal  (+) Arthritis -, Rheumatoid disorders,  Fibromyalgia -  Abdominal   Peds  Hematology negative hematology ROS (+)   Anesthesia Other Findings   Reproductive/Obstetrics negative OB ROS                            Anesthesia Physical Anesthesia Plan  ASA: III  Anesthesia Plan: General   Post-op Pain Management:    Induction: Intravenous  Airway Management Planned: Oral ETT  Additional Equipment:   Intra-op Plan:   Post-operative Plan: Extubation in OR  Informed Consent: I have reviewed the patients History and Physical, chart, labs and discussed the procedure including the risks, benefits and alternatives for the proposed anesthesia with the patient or authorized representative who has indicated his/her understanding and acceptance.   Dental advisory given  Plan Discussed with: CRNA  Anesthesia Plan Comments:         Anesthesia Quick Evaluation

## 2014-05-01 NOTE — Transfer of Care (Signed)
Immediate Anesthesia Transfer of Care Note  Patient: [name]  Procedure(s) Performed: Procedure(s) with comments: EXPLORATION OF LUMBAR WOUND (N/A) - EXPLORATION OF LUMBAR WOUND  Patient Location: PACU  Anesthesia Type:General  Level of Consciousness: awake, alert , oriented and patient cooperative  Airway & Oxygen Therapy: Patient Spontanous Breathing and Patient connected to nasal cannula oxygen  Post-op Assessment: Report given to PACU RN, Post -op Vital signs reviewed and stable, Patient moving all extremities and Patient moving all extremities X 4  Post vital signs: Reviewed and stable  Complications: No apparent anesthesia complications

## 2014-05-02 ENCOUNTER — Telehealth: Payer: Self-pay | Admitting: *Deleted

## 2014-05-02 ENCOUNTER — Other Ambulatory Visit: Payer: Self-pay | Admitting: Family Medicine

## 2014-05-02 DIAGNOSIS — I10 Essential (primary) hypertension: Secondary | ICD-10-CM

## 2014-05-02 MED ORDER — FOLIC ACID 1 MG PO TABS
3.0000 mg | ORAL_TABLET | Freq: Every day | ORAL | Status: DC
  Administered 2014-05-02 – 2014-05-05 (×4): 3 mg via ORAL
  Filled 2014-05-02 (×4): qty 3

## 2014-05-02 MED ORDER — PROPRANOLOL HCL ER 80 MG PO CP24
80.0000 mg | ORAL_CAPSULE | Freq: Every day | ORAL | Status: DC
  Administered 2014-05-03 – 2014-05-05 (×3): 80 mg via ORAL
  Filled 2014-05-02 (×5): qty 1

## 2014-05-02 MED ORDER — OXYCODONE HCL 5 MG PO TABS
10.0000 mg | ORAL_TABLET | ORAL | Status: DC | PRN
  Administered 2014-05-02 – 2014-05-05 (×12): 10 mg via ORAL
  Filled 2014-05-02 (×12): qty 2

## 2014-05-02 MED ORDER — HEPARIN SODIUM (PORCINE) 5000 UNIT/ML IJ SOLN
5000.0000 [IU] | Freq: Three times a day (TID) | INTRAMUSCULAR | Status: DC
  Administered 2014-05-02 – 2014-05-06 (×12): 5000 [IU] via SUBCUTANEOUS
  Filled 2014-05-02 (×12): qty 1

## 2014-05-02 MED ORDER — PNEUMOCOCCAL VAC POLYVALENT 25 MCG/0.5ML IJ INJ: 0.5000 mL | INJECTION | INTRAMUSCULAR | Status: DC

## 2014-05-02 MED ORDER — METHOTREXATE SODIUM CHEMO INJECTION 25 MG/ML: 25.0000 mg | INTRAMUSCULAR | Status: DC

## 2014-05-02 NOTE — Progress Notes (Signed)
Pt found flat with head on a pillow, hands behind her head watching television. Pt noncompliant with Md instructions regarding positioning even after being reeducated. Pt stated, "I have to watch this game, Im going to put my head or Im going home." Pt reeducated again on importance of positioning of 10 degrees down with a pillow. She denies pain or discomfort at this time. Surgical site dry and intact no noted drainage. Md paged to notify.

## 2014-05-02 NOTE — Op Note (Signed)
[name], CAI                  ACCOUNT NO.:  [id number]  MEDICAL RECORD NO.:  [id number]  LOCATION:  4N05C                        FACILITY:  [facility]  PHYSICIAN:  Hilda Lias, M.D.   DATE OF BIRTH:  03-Sep-1955  DATE OF PROCEDURE:  05/01/2014 DATE OF DISCHARGE:                              OPERATIVE REPORT   PREOPERATIVE DIAGNOSES:  Status post right lumbar laminotomy and foraminotomy.  Post surgical headache.  POSTOPERATIVE DIAGNOSES:  Status post right lumbar laminotomy and foraminotomy. Post surgical headache.  PROCEDURE:  Exploration of the lumbar wound, laminotomy S1, closure of pinpoint opening in the thecal sac.  Microscope.  SURGEON:  Hilda Lias, MD  CLINICAL HISTORY:  [name] has lumbar laminotomy and foraminotomy because of chronic pain going to the right leg.  Prior to that, she had epidural injections elsewhere.  After surgery, she did well.  No pain, no weakness.  Two days ago, she had a headache.  She denies falling down.  We did a CT scan which showed that she has epidural fluid collection.  Because of the headache which was mostly positional we decided to explore the area.  DESCRIPTION OF PROCEDURE:  The patient was taken to the OR.  After intubation, she was positioned in a prone manner.  Immediately after close examination, she had ecchymosis in both sides of the paralumbar area.  The skin was cleaned with DuraPrep and drapes were applied.  The previous stitches were removed.  We went straight down to the epidural area.  We brought the microscope.  Indeed, there was some CSF coming in the operative site but there was no anything basilar.  Because of that, I used the Kerrison punch, and did more laminotomy of S1.  Indeed about quarter of an inch from the operative site there was a pinhole in the dura mater.  A single stitch with 6-0 Prolene was done followed by soft tissue.  Valsalva up to 40 x2 was negative.  Then, surgical glue was applied to the  area, and the wound was closed with Vicryl and staples. The patient did well.          ______________________________ Hilda Lias, M.D.     EB/MEDQ  D:  05/01/2014  T:  05/02/2014  Job:  [id number]

## 2014-05-02 NOTE — Progress Notes (Signed)
CARE MANAGEMENT NOTE 05/02/2014  Patient:  [name],[name]   Account Number:  [id number]  Date Initiated:  05/02/2014  Documentation initiated by:  Jiles Crocker  Subjective/Objective Assessment:   ADMITTED FOR SURGERY     Action/Plan:   CM FOLLOWING FOR DCP   Anticipated DC Date:  05/09/2014   Anticipated DC Plan:  AWAITING FOR PT/OT EVALS FOR DISPOSITION NEEDS    DC Planning Services  CM consult           Status of service:  In process, will continue to follow Medicare Important Message given?   (If response is "NO", the following Medicare IM given date fields will be blank)  Per UR Regulation:  Reviewed for med. necessity/level of care/duration of stay  Comments:  12/31/2015Abelino Derrick RN,BSN,MHA [phone]

## 2014-05-02 NOTE — Telephone Encounter (Signed)
Pt is currently in the hospital. The pharmacy is calling to find out about propranolol medication. Pt was prescribed immediate release propranolol and not the long acting. Pharmacy is questioning this because pt only takes medication once a day and they think it is suppose to be the Inderal LA. Please advise.   [phone] Victorino Dike

## 2014-05-02 NOTE — Anesthesia Postprocedure Evaluation (Signed)
  Anesthesia Post-op Note  Patient: [name]  Procedure(s) Performed: Procedure(s) with comments: EXPLORATION OF LUMBAR WOUND (N/A) - EXPLORATION OF LUMBAR WOUND  Patient Location: PACU  Anesthesia Type: General   Level of Consciousness: awake, alert  and oriented  Airway and Oxygen Therapy: Patient Spontanous Breathing  Post-op Pain: mild  Post-op Assessment: Post-op Vital signs reviewed  Post-op Vital Signs: Reviewed  Last Vitals:  Filed Vitals:   05/02/14 1340  BP: 120/73  Pulse: 56  Temp: 36.7 C  Resp: 18    Complications: No apparent anesthesia complications

## 2014-05-02 NOTE — Progress Notes (Signed)
Chaplain initiated visit with pt, "Donnie." Pt explained her health situation. Pt is being supported by a church family. Pt describes herself as a "stong" woman and believe that this quality and her relationship with God are getting her through. Chaplain offered prayer and emotional support. Chaplain informed pt of her services should pt need them before follow up. Chaplain will continue to follow.    05/02/14 1000  Clinical Encounter Type  Visited With Patient  Visit Type Initial;Spiritual support  Spiritual Encounters  Spiritual Needs Prayer;Emotional  Stress Factors  Patient Stress Factors Health changes  Jaevian Shean, Mayer Masker, Chaplain 05/02/2014 10:15 AM

## 2014-05-02 NOTE — Progress Notes (Signed)
Pt repositions self while in bed. Pt noted on her side with a pillow. Pt reeducated on importance of 10 degrees down position per Md instruction. Call bell within reach. Foley intact draining clear, yellow urine. Pt stated that it was hard for her to stay still in bed. She was able to rest after receiving pain medication she stated that she did get some relief but the headache was still there just not as bad. Will continue to monitor.  Requested regular diet stating, "Im hungry and I want some meat."

## 2014-05-02 NOTE — Telephone Encounter (Signed)
Spoke with Anderson Malta, PharmD.  [name] has been on inderal IR 80 once daily- she probably is supposed to be on the LA.  I'm not sure exactly what her original intended rx might have been- she has been on the 80 IR since I met her and I did refill it for her without realizing the problem .  Will change her to the 80 LA- will send this rx to her drug store

## 2014-05-02 NOTE — Progress Notes (Signed)
Pt c/o of severe sharp headache not resolved by anything that she has taken thus far. Surgical site assessed no drainage, honeycomb dressing clean dry and intact. MD notified, he stated that he would be to see pt in 10 minutes.

## 2014-05-02 NOTE — Progress Notes (Signed)
Patient ID: [name], female   DOB: 1955-05-18, 58 y.o.   MRN: [id number] Found the patient flat with pillow. Should be 10 degrees DOWN with a pillow. No weakness. Spoke with her about the reason to stay with head down. To change to pac morphine

## 2014-05-03 MED ORDER — PROPRANOLOL HCL ER 80 MG PO CP24: 80.0000 mg | ORAL_CAPSULE | Freq: Every day | ORAL | Status: DC

## 2014-05-03 NOTE — Progress Notes (Signed)
Patient ID: [name], female   DOB: Aug 21, 1955, 59 y.o.   MRN: [id number] Subjective:  The patient is alert and pleasant. She "wants to sit up". She denies headaches.  Objective: Vital signs in last 24 hours: Temp:  [97.6 F (36.4 C)-98.2 F (36.8 C)] 97.9 F (36.6 C) (01/01 1046) Pulse Rate:  [50-64] 54 (01/01 1046) Resp:  [16-20] 20 (01/01 1046) BP: (106-123)/(57-73) 122/63 mmHg (01/01 1046) SpO2:  [95 %-99 %] 99 % (01/01 1046)  Intake/Output from previous day: 12/31 0701 - 01/01 0700 In: -  Out: 2350 [Urine:2350] Intake/Output this shift:    Physical exam the patient is alert and oriented 3. Her strength is normal in her lower extremities. Her dressing is clean and dry.  Lab Results:  Recent Labs  05/01/14 1318  WBC 9.3  HGB 13.3  HCT 40.9  PLT 359   BMET  Recent Labs  05/01/14 1318  NA 138  K 3.2*  CL 100  CO2 30  GLUCOSE 88  BUN 15  CREATININE 0.81  CALCIUM 9.0    Studies/Results: Ct Lumbar Spine Wo Contrast  05/01/2014   CLINICAL DATA:  Headache.  Recent lumbar spine surgery.  EXAM: CT LUMBAR SPINE WITHOUT CONTRAST  TECHNIQUE: Multidetector CT imaging of the lumbar spine was performed without intravenous contrast administration. Multiplanar CT image reconstructions were also generated.  COMPARISON:  Lumbar myelogram 04/04/2014  FINDINGS: The patient is status post right laminectomy at L5. There is an extra-axial fluid or soft tissue collection at the laminectomy site measuring 17 x 8 mm on image 111 of series 2 a to. The fluid collection is noted within the subcutaneous fat measuring 6.8 x 3.2 cm on the sagittal images. There may be a connection to the laminectomy site. This appears to be along the course of the surgery.  Endplate changes on the right at L5-S1 are stable. Left-sided endplate changes are again noted at L3-4 with a vacuum disc. Vacuum disc is present at L2-3 as well. The lumbar spine is otherwise stable. Facet degenerative changes again noted  L3-4 and L4-5.  Limited imaging of the abdomen and pelvis is unremarkable.  IMPRESSION: 1. 17 x 8 mm fluid or soft tissue collection at the site of the right laminectomy. Is CSF leak is not excluded. However the CT could represent atypical postoperative seroma. It is unclear if there is mass effect on the thecal sac. MRI would be more sensitive and specific for the evaluation of an extra-axial fluid collection. 2. A more superficial fluid collection in the subcutaneous fat measures 6.8 x 3.2 cm in the sagittal images. 3. 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   Electronically Signed   By: Gennette Pac M.D.   On: 05/01/2014 15:03    Assessment/Plan: Postop day #2 with a CSF leak: As per Dr. Cassandria Santee instructions she will remain flat until later this evening, i.e. for 48 hours status post surgery. We will then begin to raise the head of her bed and hopefully mobilize her tomorrow. I have answered all her questions.  LOS: 2 days     Jemari Hallum D 05/03/2014, 10:56 AM

## 2014-05-04 NOTE — Progress Notes (Signed)
Pt got up to the chair for 1 hr. She reports slight neck soreness and a slight headache, but nothing severe. Otherwise, pt tolerated mobility well. Will continue to monitor.

## 2014-05-04 NOTE — Progress Notes (Signed)
Patient ID: [name], female   DOB: April 20, 1956, 59 y.o.   MRN: [id number] Patient seems to be doing fairly well now status post wound repair for CSF leak lying flat  Strength out of 5 wound clean dry and intact  Slowly raise her head of bed and mobilized today

## 2014-05-05 MED ORDER — CYCLOBENZAPRINE HCL 10 MG PO TABS: 10.0000 mg | ORAL_TABLET | Freq: Three times a day (TID) | ORAL | Status: DC | PRN

## 2014-05-05 MED ORDER — POLYETHYLENE GLYCOL 3350 17 G PO PACK
17.0000 g | PACK | Freq: Every day | ORAL | Status: DC
  Administered 2014-05-05: 17 g via ORAL
  Filled 2014-05-05: qty 1

## 2014-05-05 MED ORDER — OXYCODONE-ACETAMINOPHEN 5-325 MG PO TABS: 1.0000 | ORAL_TABLET | ORAL | Status: DC | PRN

## 2014-05-05 NOTE — Progress Notes (Signed)
Pt attempted ambulation again, to the bathroom and back to bed. Pt had a headache rated 5/10, which subsides when she lies down. Will continue to monitor.

## 2014-05-05 NOTE — Progress Notes (Signed)
Pt ambulated in the hall, about 100 feet. She had a headache of 4/10, but no other complaints. Will continue to monitor.

## 2014-05-05 NOTE — Progress Notes (Signed)
Pt ambulated in the hall for about 50 feet. She reported feeling lightheaded, nauseated, and had a headache rated 5/10. Pt back in bed. Will continue to monitor.

## 2014-05-05 NOTE — Progress Notes (Signed)
Pt ambulated down the hall. She did start having a headache, rated 6/10, at the end of the walk. Pt back in bed, sitting up. Will notify MD. Will continue to monitor pt.

## 2014-05-05 NOTE — Discharge Summary (Signed)
Physician Discharge Summary  Patient ID: [name] MRN: [id number] DOB/AGE: 61-Feb-1957 61 y.o.  Admit date: 05/01/2014 Discharge date: 05/05/2014  Admission Diagnoses: Pseudomeningocele    Discharge Diagnoses: Same   Discharged Condition: good  Hospital Course: The patient was admitted on 05/01/2014 and taken to the operating room where the patient underwent lumbar reexploration with repair of pseudomeningocele. The patient tolerated the procedure well and was taken to the recovery room and then to the floor in stable condition. The hospital course was routine. There were no complications. She remained at flat bedrest for 48 hours. The wound remained clean dry and intact. Pt had appropriate back soreness. No complaints of leg pain or new N/T/W. The patient remained afebrile with stable vital signs, and tolerated a regular diet. The patient continued to increase activities, and pain was well controlled with oral pain medications. She had no significant headache with mobilization. Her wound remained flat and dry.  Consults: None  Significant Diagnostic Studies:  Results for orders placed or performed during the hospital encounter of 05/01/14  CBC  Result Value Ref Range   WBC 9.3 4.0 - 10.5 K/uL   RBC 4.39 3.87 - 5.11 MIL/uL   Hemoglobin 13.3 12.0 - 15.0 g/dL   HCT 71.2 45.8 - 09.9 %   MCV 93.2 78.0 - 100.0 fL   MCH 30.3 26.0 - 34.0 pg   MCHC 32.5 30.0 - 36.0 g/dL   RDW 83.3 82.5 - 05.3 %   Platelets 359 150 - 400 K/uL  Basic metabolic panel  Result Value Ref Range   Sodium 138 135 - 145 mmol/L   Potassium 3.2 (L) 3.5 - 5.1 mmol/L   Chloride 100 96 - 112 mEq/L   CO2 30 19 - 32 mmol/L   Glucose, Bld 88 70 - 99 mg/dL   BUN 15 6 - 23 mg/dL   Creatinine, Ser 9.76 0.50 - 1.10 mg/dL   Calcium 9.0 8.4 - 73.4 mg/dL   GFR calc non Af Amer 77 (L) >90 mL/min   GFR calc Af Amer 90 (L) >90 mL/min   Anion gap 8 5 - 15  Glucose, capillary  Result Value Ref Range   Glucose-Capillary 111  (H) 70 - 99 mg/dL    Dg Lumbar Spine 2-3 Views  04/23/2014   CLINICAL DATA:  There 59 year old female undergoing L5-S1 laminectomy and foraminotomy  EXAM: LUMBAR SPINE - 2-3 VIEW  COMPARISON:  Preoperative CT scan 04/04/2014  FINDINGS: Two cross-table lateral images are submitted. On the first image, a metallic marker projects over the L5 spinous process. On the second image, soft tissue spreaders are noted posteriorly at the L5-S1 level. A metallic probe is present at the L5-S1 facet.  IMPRESSION: Intraoperative localization radiographs as above.   Electronically Signed   By: Malachy Moan M.D.   On: 04/23/2014 20:26   Ct Lumbar Spine Wo Contrast  05/01/2014   CLINICAL DATA:  Headache.  Recent lumbar spine surgery.  EXAM: CT LUMBAR SPINE WITHOUT CONTRAST  TECHNIQUE: Multidetector CT imaging of the lumbar spine was performed without intravenous contrast administration. Multiplanar CT image reconstructions were also generated.  COMPARISON:  Lumbar myelogram 04/04/2014  FINDINGS: The patient is status post right laminectomy at L5. There is an extra-axial fluid or soft tissue collection at the laminectomy site measuring 17 x 8 mm on image 111 of series 2 a to. The fluid collection is noted within the subcutaneous fat measuring 6.8 x 3.2 cm on the sagittal images. There may be a  connection to the laminectomy site. This appears to be along the course of the surgery.  Endplate changes on the right at L5-S1 are stable. Left-sided endplate changes are again noted at L3-4 with a vacuum disc. Vacuum disc is present at L2-3 as well. The lumbar spine is otherwise stable. Facet degenerative changes again noted L3-4 and L4-5.  Limited imaging of the abdomen and pelvis is unremarkable.  IMPRESSION: 1. 17 x 8 mm fluid or soft tissue collection at the site of the right laminectomy. Is CSF leak is not excluded. However the CT could represent atypical postoperative seroma. It is unclear if there is mass effect on the  thecal sac. MRI would be more sensitive and specific for the evaluation of an extra-axial fluid collection. 2. A more superficial fluid collection in the subcutaneous fat measures 6.8 x 3.2 cm in the sagittal images. 3. 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   Electronically Signed   By: Gennette Pac M.D.   On: 05/01/2014 15:03    Antibiotics:  Anti-infectives    Start     Dose/Rate Route Frequency Ordered Stop   05/02/14 0100  ceFAZolin (ANCEF) IVPB 1 g/50 mL premix     1 g100 mL/hr over 30 Minutes Intravenous Every 8 hours 05/01/14 1913 05/02/14 1018      Discharge Exam: Blood pressure 114/63, pulse 53, temperature 97.9 F (36.6 C), temperature source Oral, resp. rate 20, height 5\' 3"  (1.6 m), weight 218 lb (98.884 kg), SpO2 100 %. Neurologic: Grossly normal Incision clean dry and intact with dry dressing  Discharge Medications:     Medication List    STOP taking these medications        predniSONE 20 MG tablet  Commonly known as:  DELTASONE      TAKE these medications        aspirin EC 81 MG tablet  Take 81 mg by mouth daily.     atorvastatin 20 MG tablet  Commonly known as:  LIPITOR  Take 1 tablet (20 mg total) by mouth every evening.     B-D TB SYRINGE 1CC/27GX1/2" 27G X 1/2" 1 ML Misc  Generic drug:  TUBERCULIN SYR 1CC/27GX1/2"     citalopram 20 MG tablet  Commonly known as:  CELEXA  Take 1 tablet (20 mg total) by mouth daily.     cyclobenzaprine 10 MG tablet  Commonly known as:  FLEXERIL  Take 1 tablet (10 mg total) by mouth 3 (three) times daily as needed for muscle spasms.     folic acid 1 MG tablet  Commonly known as:  FOLVITE  Take 3 mg by mouth daily.     gabapentin 300 MG capsule  Commonly known as:  NEURONTIN  Take 300 mg by mouth 2 (two) times daily.     hydrochlorothiazide 25 MG tablet  Commonly known as:  HYDRODIURIL  Take 1 tablet (25 mg total) by mouth daily.     loratadine 10 MG  tablet  Commonly known as:  CLARITIN  Take 1 tablet (10 mg total) by mouth daily.     Methotrexate Sodium (PF) 50 MG/2ML Soln  Inject 25 mg into the skin every Friday.     naproxen 500 MG tablet  Commonly known as:  NAPROSYN  Take 500 mg by mouth 2 (two) times daily with a meal.     omeprazole 20 MG capsule  Commonly known as:  PRILOSEC  TAKE 1 CAPSULE BY MOUTH  DAILY FOR POSSIBLE ACID REFLUX.     ondansetron 4 MG disintegrating tablet  Commonly known as:  ZOFRAN-ODT  DISSOLVE 1 TABLET ON TONGUE EVERY 8 HOURS AS NEEDED FOR NAUSEA OR VOMITING.     oxyCODONE-acetaminophen 5-325 MG per tablet  Commonly known as:  PERCOCET  Take 1-2 tablets by mouth every 4 (four) hours as needed.     potassium bicarbonate 25 MEQ disintegrating tablet  Commonly known as:  KLOR-CON/EF  Take 1 tablet (25 mEq total) by mouth daily.     propranolol ER 80 MG 24 hr capsule  Commonly known as:  INDERAL LA  Take 1 capsule (80 mg total) by mouth daily.     traMADol 50 MG tablet  Commonly known as:  ULTRAM  Take 1-2 tablets (50-100 mg total) by mouth at bedtime as needed.     traZODone 100 MG tablet  Commonly known as:  DESYREL  Take 1 -2 tablets every night as needed for sleep.        Disposition: Home   Final Dx: Pseudomeningocele repair      Discharge Instructions    Call MD for:  difficulty breathing, headache or visual disturbances    Complete by:  As directed      Call MD for:  persistant nausea and vomiting    Complete by:  As directed      Call MD for:  redness, tenderness, or signs of infection (pain, swelling, redness, odor or green/yellow discharge around incision site)    Complete by:  As directed      Call MD for:  severe uncontrolled pain    Complete by:  As directed      Call MD for:  temperature >100.4    Complete by:  As directed      Diet - low sodium heart healthy    Complete by:  As directed      Discharge instructions    Complete by:  As directed   No strenuous  activity, no lifting or bending, may shower.     Increase activity slowly    Complete by:  As directed      Remove dressing in 24 hours    Complete by:  As directed            Follow-up Information    Follow up with Karn Cassis, MD. Schedule an appointment as soon as possible for a visit in 1 week.   Specialty:  Neurosurgery   Contact information:   [address] [place] [address] [phone]        Signed: Tia Alert 05/05/2014, 9:47 AM

## 2014-05-06 ENCOUNTER — Encounter (HOSPITAL_COMMUNITY): Payer: Self-pay | Admitting: Neurosurgery

## 2014-05-06 NOTE — Progress Notes (Signed)
Pt is being discharged home. Discharge instructions were given to patient and family 

## 2014-05-06 NOTE — Progress Notes (Signed)
Doing well, ambulating, no headache. Wound flat and dry. Wants to go home

## 2014-05-07 ENCOUNTER — Inpatient Hospital Stay (HOSPITAL_COMMUNITY)
Admission: EM | Admit: 2014-05-07 | Discharge: 2014-05-13 | DRG: 029 | Disposition: A | Discharge status: Home / Self Care | Payer: BLUE CROSS/BLUE SHIELD | Attending: Neurosurgery | Admitting: Neurosurgery

## 2014-05-07 ENCOUNTER — Encounter (HOSPITAL_COMMUNITY): Payer: Self-pay | Admitting: *Deleted

## 2014-05-07 DIAGNOSIS — F419 Anxiety disorder, unspecified: Secondary | ICD-10-CM | POA: Diagnosis present

## 2014-05-07 DIAGNOSIS — K219 Gastro-esophageal reflux disease without esophagitis: Secondary | ICD-10-CM | POA: Diagnosis present

## 2014-05-07 DIAGNOSIS — I10 Essential (primary) hypertension: Secondary | ICD-10-CM | POA: Diagnosis present

## 2014-05-07 DIAGNOSIS — G4700 Insomnia, unspecified: Secondary | ICD-10-CM | POA: Diagnosis present

## 2014-05-07 DIAGNOSIS — R51 Headache: Secondary | ICD-10-CM | POA: Diagnosis not present

## 2014-05-07 DIAGNOSIS — Y838 Other surgical procedures as the cause of abnormal reaction of the patient, or of later complication, without mention of misadventure at the time of the procedure: Secondary | ICD-10-CM | POA: Diagnosis present

## 2014-05-07 DIAGNOSIS — G9600 Cerebrospinal fluid leak, unspecified: Secondary | ICD-10-CM | POA: Diagnosis present

## 2014-05-07 DIAGNOSIS — G9782 Other postprocedural complications and disorders of nervous system: Secondary | ICD-10-CM | POA: Diagnosis not present

## 2014-05-07 DIAGNOSIS — Z79899 Other long term (current) drug therapy: Secondary | ICD-10-CM

## 2014-05-07 DIAGNOSIS — Z87891 Personal history of nicotine dependence: Secondary | ICD-10-CM

## 2014-05-07 DIAGNOSIS — E785 Hyperlipidemia, unspecified: Secondary | ICD-10-CM | POA: Diagnosis present

## 2014-05-07 DIAGNOSIS — Z7982 Long term (current) use of aspirin: Secondary | ICD-10-CM

## 2014-05-07 DIAGNOSIS — J302 Other seasonal allergic rhinitis: Secondary | ICD-10-CM | POA: Diagnosis present

## 2014-05-07 DIAGNOSIS — M069 Rheumatoid arthritis, unspecified: Secondary | ICD-10-CM | POA: Diagnosis present

## 2014-05-07 LAB — I-STAT CHEM 8, ED
BUN: 29 mg/dL — ABNORMAL HIGH (ref 6–23)
CALCIUM ION: 1.08 mmol/L — AB (ref 1.13–1.30)
CREATININE: 0.9 mg/dL (ref 0.50–1.10)
Chloride: 96 mEq/L (ref 96–112)
Glucose, Bld: 107 mg/dL — ABNORMAL HIGH (ref 70–99)
HCT: 41 % (ref 36.0–46.0)
Hemoglobin: 13.9 g/dL (ref 12.0–15.0)
Potassium: 3.1 mmol/L — ABNORMAL LOW (ref 3.5–5.1)
SODIUM: 137 mmol/L (ref 135–145)
TCO2: 26 mmol/L (ref 0–100)

## 2014-05-07 LAB — CBC WITH DIFFERENTIAL/PLATELET
BASOS ABS: 0.1 10*3/uL (ref 0.0–0.1)
BASOS PCT: 1 % (ref 0–1)
EOS PCT: 3 % (ref 0–5)
Eosinophils Absolute: 0.3 10*3/uL (ref 0.0–0.7)
HEMATOCRIT: 37.1 % (ref 36.0–46.0)
Hemoglobin: 12.2 g/dL (ref 12.0–15.0)
Lymphocytes Relative: 25 % (ref 12–46)
Lymphs Abs: 2.5 10*3/uL (ref 0.7–4.0)
MCH: 30.1 pg (ref 26.0–34.0)
MCHC: 32.9 g/dL (ref 30.0–36.0)
MCV: 91.6 fL (ref 78.0–100.0)
Monocytes Absolute: 0.8 10*3/uL (ref 0.1–1.0)
Monocytes Relative: 8 % (ref 3–12)
Neutro Abs: 6.1 10*3/uL (ref 1.7–7.7)
Neutrophils Relative %: 63 % (ref 43–77)
PLATELETS: 274 10*3/uL (ref 150–400)
RBC: 4.05 MIL/uL (ref 3.87–5.11)
RDW: 15.2 % (ref 11.5–15.5)
WBC: 9.7 10*3/uL (ref 4.0–10.5)

## 2014-05-07 MED ORDER — POTASSIUM CHLORIDE CRYS ER 20 MEQ PO TBCR
20.0000 meq | EXTENDED_RELEASE_TABLET | Freq: Every day | ORAL | Status: DC
  Administered 2014-05-07 – 2014-05-13 (×7): 20 meq via ORAL
  Filled 2014-05-07 (×7): qty 1

## 2014-05-07 MED ORDER — TRAMADOL HCL 50 MG PO TABS: 50.0000 mg | ORAL_TABLET | Freq: Every evening | ORAL | Status: DC | PRN

## 2014-05-07 MED ORDER — SENNA 8.6 MG PO TABS
1.0000 | ORAL_TABLET | Freq: Two times a day (BID) | ORAL | Status: DC
  Administered 2014-05-07 – 2014-05-13 (×9): 8.6 mg via ORAL
  Filled 2014-05-07 (×11): qty 1

## 2014-05-07 MED ORDER — PANTOPRAZOLE SODIUM 40 MG PO TBEC
40.0000 mg | DELAYED_RELEASE_TABLET | Freq: Every day | ORAL | Status: DC
  Administered 2014-05-08 – 2014-05-13 (×6): 40 mg via ORAL
  Filled 2014-05-07 (×6): qty 1

## 2014-05-07 MED ORDER — CITALOPRAM HYDROBROMIDE 10 MG PO TABS: 20.0000 mg | ORAL_TABLET | Freq: Every day | ORAL | Status: DC

## 2014-05-07 MED ORDER — OXYCODONE HCL 5 MG PO TABS
5.0000 mg | ORAL_TABLET | ORAL | Status: DC | PRN
  Administered 2014-05-09 (×2): 5 mg via ORAL
  Filled 2014-05-07 (×2): qty 1

## 2014-05-07 MED ORDER — HYDROCHLOROTHIAZIDE 25 MG PO TABS
25.0000 mg | ORAL_TABLET | Freq: Every day | ORAL | Status: DC
  Administered 2014-05-10 – 2014-05-13 (×2): 25 mg via ORAL
  Filled 2014-05-07 (×4): qty 1

## 2014-05-07 MED ORDER — PROPRANOLOL HCL ER 80 MG PO CP24
80.0000 mg | ORAL_CAPSULE | Freq: Every day | ORAL | Status: DC
  Administered 2014-05-10 – 2014-05-13 (×2): 80 mg via ORAL
  Filled 2014-05-07 (×6): qty 1

## 2014-05-07 MED ORDER — GABAPENTIN 300 MG PO CAPS
300.0000 mg | ORAL_CAPSULE | Freq: Two times a day (BID) | ORAL | Status: DC
  Administered 2014-05-07 – 2014-05-13 (×11): 300 mg via ORAL
  Filled 2014-05-07 (×11): qty 1

## 2014-05-07 MED ORDER — DOCUSATE SODIUM 100 MG PO CAPS
100.0000 mg | ORAL_CAPSULE | Freq: Two times a day (BID) | ORAL | Status: DC
  Administered 2014-05-07 – 2014-05-13 (×10): 100 mg via ORAL
  Filled 2014-05-07 (×11): qty 1

## 2014-05-07 MED ORDER — ATORVASTATIN CALCIUM 20 MG PO TABS
20.0000 mg | ORAL_TABLET | Freq: Every evening | ORAL | Status: DC
  Administered 2014-05-07 – 2014-05-12 (×5): 20 mg via ORAL
  Filled 2014-05-07: qty 1
  Filled 2014-05-07: qty 2
  Filled 2014-05-07 (×3): qty 1
  Filled 2014-05-07 (×2): qty 2
  Filled 2014-05-07 (×3): qty 1
  Filled 2014-05-07: qty 2

## 2014-05-07 MED ORDER — TRAZODONE HCL 50 MG PO TABS
50.0000 mg | ORAL_TABLET | Freq: Every evening | ORAL | Status: DC | PRN
  Administered 2014-05-07 – 2014-05-12 (×4): 50 mg via ORAL
  Filled 2014-05-07 (×5): qty 1

## 2014-05-07 MED ORDER — FOLIC ACID 1 MG PO TABS: 3.0000 mg | ORAL_TABLET | Freq: Every day | ORAL | Status: DC

## 2014-05-07 MED ORDER — SODIUM CHLORIDE 0.9 % IV SOLN: INTRAVENOUS | Status: DC

## 2014-05-07 MED ORDER — HYDROCHLOROTHIAZIDE 25 MG PO TABS
25.0000 mg | ORAL_TABLET | Freq: Every day | ORAL | Status: DC
Start: 2014-05-07 — End: 2014-05-07

## 2014-05-07 MED ORDER — PANTOPRAZOLE SODIUM 40 MG PO TBEC: 40.0000 mg | DELAYED_RELEASE_TABLET | Freq: Every day | ORAL | Status: DC

## 2014-05-07 MED ORDER — POTASSIUM CHLORIDE CRYS ER 20 MEQ PO TBCR: 20.0000 meq | EXTENDED_RELEASE_TABLET | Freq: Every day | ORAL | Status: DC

## 2014-05-07 MED ORDER — NAPROXEN 250 MG PO TABS
500.0000 mg | ORAL_TABLET | Freq: Two times a day (BID) | ORAL | Status: DC
  Administered 2014-05-08: 500 mg via ORAL
  Filled 2014-05-07: qty 2

## 2014-05-07 MED ORDER — CYCLOBENZAPRINE HCL 10 MG PO TABS: 10.0000 mg | ORAL_TABLET | Freq: Three times a day (TID) | ORAL | Status: DC | PRN

## 2014-05-07 MED ORDER — GABAPENTIN 300 MG PO CAPS: 300.0000 mg | ORAL_CAPSULE | Freq: Two times a day (BID) | ORAL | Status: DC

## 2014-05-07 MED ORDER — LORATADINE 10 MG PO TABS
10.0000 mg | ORAL_TABLET | Freq: Every day | ORAL | Status: DC
  Administered 2014-05-08 – 2014-05-13 (×6): 10 mg via ORAL
  Filled 2014-05-07 (×6): qty 1

## 2014-05-07 MED ORDER — FOLIC ACID 1 MG PO TABS
3.0000 mg | ORAL_TABLET | Freq: Every day | ORAL | Status: DC
  Administered 2014-05-08 – 2014-05-13 (×6): 3 mg via ORAL
  Filled 2014-05-07 (×6): qty 3

## 2014-05-07 MED ORDER — ONDANSETRON HCL 4 MG/2ML IJ SOLN: 4.0000 mg | Freq: Four times a day (QID) | INTRAMUSCULAR | Status: DC | PRN

## 2014-05-07 MED ORDER — SODIUM CHLORIDE 0.9 % IV SOLN
Freq: Once | INTRAVENOUS | Status: AC
  Administered 2014-05-07: 22:00:00 via INTRAVENOUS

## 2014-05-07 MED ORDER — CITALOPRAM HYDROBROMIDE 10 MG PO TABS
20.0000 mg | ORAL_TABLET | Freq: Every day | ORAL | Status: DC
  Administered 2014-05-08 – 2014-05-13 (×6): 20 mg via ORAL
  Filled 2014-05-07 (×6): qty 2

## 2014-05-07 MED ORDER — ONDANSETRON HCL 4 MG PO TABS: 4.0000 mg | ORAL_TABLET | Freq: Four times a day (QID) | ORAL | Status: DC | PRN

## 2014-05-07 NOTE — ED Notes (Signed)
Report attempted 

## 2014-05-07 NOTE — ED Notes (Signed)
Pt arrives via [facility] from home. Pt had spinal surgery last Tuesday and experienced complications and was admitted and had surgery again Friday. Pt was discharged yesterday and is experiencing discharge from operative site. Pt also c/o frontal headache  9/10.

## 2014-05-07 NOTE — ED Provider Notes (Signed)
CSN: [id number]     Arrival date & time 05/07/14  1958 History   First MD Initiated Contact with Patient 05/07/14 2045     Chief Complaint  Patient presents with  . Post-op Problem  . Headache     (Consider location/radiation/quality/duration/timing/severity/associated sxs/prior Treatment) HPI Comments: Patient had a spinal surgery on December 22,2015 has had several readmissions to the hospital for fluid leak and headache.  She was recently admitted to the hospital and discharged after 5 days being headache free with new spondylitic she's been home less than 24 hours.  She then developed headache and fluid leaking from the surgical site. She denies any fever, visual disturbances.  She states she only has a headache when she is upright.  She has had to change the dressing on her surgical site twice as it has become saturated with clear yellow tinged fluid  Patient is a 59 y.o. female presenting with headaches. The history is provided by the patient.  Headache Pain location:  Generalized Quality:  Unable to specify Radiates to:  Does not radiate Severity currently:  6/10 Severity at highest:  7/10 Onset quality:  Gradual Duration:  8 hours Timing:  Constant Progression:  Unchanged Chronicity:  Recurrent Similar to prior headaches: yes   Relieved by:  None tried Exacerbated by: Being upright. Ineffective treatments:  None tried Associated symptoms: no cough, no dizziness and no fever     Past Medical History  Diagnosis Date  . PONV (postoperative nausea and vomiting)     violent nausea/vomiting  . Hyperlipidemia     takes Atorvastatin daily  . Obesity   . Arthritis   . Anxiety     takes Citalopram daily  . Muscle spasm of back     takes Flexeril daily as needed  . Seasonal allergies     takes Claritin daily  . GERD (gastroesophageal reflux disease)     takes Omeprazole daily  . Hypertension     takes HCTZ daily as well as Propranolol  . Pneumonia     hx of-many,many  yrs ago  . History of bronchitis     last yr  . Migraine     hx of  . Rheumatoid arthritis   . Joint pain   . Joint swelling   . Chronic back pain     radiculopathy  . Urinary urgency   . Diabetes mellitus without complication     "pre"  . Insomnia     takes trazodone nightly  . History of shingles    Past Surgical History  Procedure Laterality Date  . Back surgery    . Abdominal hysterectomy  2003  . Cholecystectomy N/A 06/27/2012    Procedure: LAPAROSCOPIC CHOLECYSTECTOMY WITH INTRAOPERATIVE CHOLANGIOGRAM;  Surgeon: Currie Paris, MD;  Location: [facility];  Service: General;  Laterality: N/A;  . Carpal tunnel release Bilateral   . Neck surgery    . Colonoscopy    . Lumbar laminectomy/decompression microdiscectomy Right 04/23/2014    Procedure: Right L5-S1 Laminectomy/Foraminotomy;  Surgeon: Karn Cassis, MD;  Location: [facility];  Service: Neurosurgery;  Laterality: Right;  Right L5-S1 Laminectomy/Foraminotomy  . Lumbar wound debridement N/A 05/01/2014    Procedure: EXPLORATION OF LUMBAR WOUND;  Surgeon: Karn Cassis, MD;  Location: [facility];  Service: Neurosurgery;  Laterality: N/A;  EXPLORATION OF LUMBAR WOUND   Family History  Problem Relation Age of Onset  . Leukemia Father   . Cancer Father   . Diabetes Maternal Grandmother   .  Heart disease Paternal Grandmother   . Colon cancer Paternal Aunt 53  . Rheum arthritis Mother   . Heart disease Mother     2 coronary stents  . Heart disease Maternal Uncle   . Heart disease Maternal Uncle    History  Substance Use Topics  . Smoking status: Former Smoker    Types: Cigarettes  . Smokeless tobacco: Never Used     Comment: quit smoking 67yrs ago  . Alcohol Use: No   OB History    No data available     Review of Systems  Constitutional: Negative for fever.  Respiratory: Negative for cough.   Skin: Positive for wound.  Neurological: Positive for headaches. Negative for dizziness and weakness.  All  other systems reviewed and are negative.     Allergies  Hydrocodone  Home Medications   Prior to Admission medications   Medication Sig Start Date End Date Taking? Authorizing Provider  aspirin EC 81 MG tablet Take 81 mg by mouth daily.    Historical Provider, MD  atorvastatin (LIPITOR) 20 MG tablet Take 1 tablet (20 mg total) by mouth every evening. 11/05/13   Pearline Cables, MD  B-D TB SYRINGE 1CC/27GX1/2" 27G X 1/2" 1 ML MISC  12/28/13   Historical Provider, MD  citalopram (CELEXA) 20 MG tablet Take 1 tablet (20 mg total) by mouth daily. 11/05/13   Gwenlyn Found Copland, MD  cyclobenzaprine (FLEXERIL) 10 MG tablet Take 1 tablet (10 mg total) by mouth 3 (three) times daily as needed for muscle spasms. 05/05/14   Tia Alert, MD  folic acid (FOLVITE) 1 MG tablet Take 3 mg by mouth daily.  03/01/14   Historical Provider, MD  gabapentin (NEURONTIN) 300 MG capsule Take 300 mg by mouth 2 (two) times daily.    Historical Provider, MD  hydrochlorothiazide (HYDRODIURIL) 25 MG tablet Take 1 tablet (25 mg total) by mouth daily. 11/05/13   Gwenlyn Found Copland, MD  loratadine (CLARITIN) 10 MG tablet Take 1 tablet (10 mg total) by mouth daily. 10/23/13   Nelva Nay, PA-C  Methotrexate Sodium, PF, 50 MG/2ML SOLN Inject 25 mg into the skin every Friday.     Historical Provider, MD  naproxen (NAPROSYN) 500 MG tablet Take 500 mg by mouth 2 (two) times daily with a meal.    Historical Provider, MD  omeprazole (PRILOSEC) 20 MG capsule TAKE 1 CAPSULE BY MOUTH DAILY FOR POSSIBLE ACID REFLUX. 03/13/14   Jonita Albee, MD  ondansetron (ZOFRAN-ODT) 4 MG disintegrating tablet DISSOLVE 1 TABLET ON TONGUE EVERY 8 HOURS AS NEEDED FOR NAUSEA OR VOMITING. Patient not taking: Reported on 05/01/2014    Pearline Cables, MD  oxyCODONE-acetaminophen (PERCOCET) 5-325 MG per tablet Take 1-2 tablets by mouth every 4 (four) hours as needed. 05/05/14   Tia Alert, MD  potassium bicarbonate (KLOR-CON/EF) 25 MEQ disintegrating  tablet Take 1 tablet (25 mEq total) by mouth daily. Patient taking differently: Take 25 mEq by mouth at bedtime. DISSOLVE IN WATER 10/04/13   Morrell Riddle, PA-C  propranolol ER (INDERAL LA) 80 MG 24 hr capsule Take 1 capsule (80 mg total) by mouth daily. 05/03/14   Gwenlyn Found Copland, MD  traMADol (ULTRAM) 50 MG tablet Take 1-2 tablets (50-100 mg total) by mouth at bedtime as needed. Patient taking differently: Take 50-100 mg by mouth at bedtime.  11/05/13   Pearline Cables, MD  traZODone (DESYREL) 100 MG tablet Take 1 -2 tablets every night as needed for  sleep. 11/05/13   Gwenlyn Found Copland, MD   BP 110/67 mmHg  Pulse 56  Temp(Src) 97.7 F (36.5 C) (Oral)  Resp 12  Ht 5\' 3"  (1.6 m)  Wt 218 lb (98.884 kg)  BMI 38.63 kg/m2  SpO2 96% Physical Exam  Constitutional: She is oriented to person, place, and time. She appears well-developed and well-nourished.  HENT:  Head: Normocephalic.  Eyes: Pupils are equal, round, and reactive to light.  Cardiovascular: Normal rate and regular rhythm.   Pulmonary/Chest: Effort normal.  Neurological: She is alert and oriented to person, place, and time.  Skin: Skin is warm. No erythema.     Nursing note and vitals reviewed.   ED Course  Procedures (including critical care time) Labs Review Labs Reviewed  CBC WITH DIFFERENTIAL  I-STAT CHEM 8, ED    Imaging Review No results found.   EKG Interpretation None      MDM   Final diagnoses:  Postoperative CSF leak         , NP 05/07/14 2114  2115, MD 05/10/14 [phone]

## 2014-05-07 NOTE — Progress Notes (Signed)
Patient arrived around 2230 lower back incision about 3 inches long closed with staples open to air appearing dry. Patient pleasant and resting comfortably. Will continue to monitor.

## 2014-05-08 ENCOUNTER — Observation Stay (HOSPITAL_COMMUNITY): Payer: BLUE CROSS/BLUE SHIELD

## 2014-05-08 ENCOUNTER — Inpatient Hospital Stay (HOSPITAL_COMMUNITY): Payer: BLUE CROSS/BLUE SHIELD | Admitting: Certified Registered Nurse Anesthetist

## 2014-05-08 ENCOUNTER — Encounter (HOSPITAL_COMMUNITY)
Admission: EM | Disposition: A | Discharge status: Home / Self Care | Payer: Self-pay | Source: Home / Self Care | Attending: Neurosurgery

## 2014-05-08 ENCOUNTER — Encounter (HOSPITAL_COMMUNITY): Payer: Self-pay | Admitting: Certified Registered Nurse Anesthetist

## 2014-05-08 DIAGNOSIS — J302 Other seasonal allergic rhinitis: Secondary | ICD-10-CM | POA: Diagnosis present

## 2014-05-08 DIAGNOSIS — M069 Rheumatoid arthritis, unspecified: Secondary | ICD-10-CM | POA: Diagnosis present

## 2014-05-08 DIAGNOSIS — Y838 Other surgical procedures as the cause of abnormal reaction of the patient, or of later complication, without mention of misadventure at the time of the procedure: Secondary | ICD-10-CM | POA: Diagnosis present

## 2014-05-08 DIAGNOSIS — G4700 Insomnia, unspecified: Secondary | ICD-10-CM | POA: Diagnosis present

## 2014-05-08 DIAGNOSIS — Z79899 Other long term (current) drug therapy: Secondary | ICD-10-CM | POA: Diagnosis not present

## 2014-05-08 DIAGNOSIS — F419 Anxiety disorder, unspecified: Secondary | ICD-10-CM | POA: Diagnosis present

## 2014-05-08 DIAGNOSIS — Z7982 Long term (current) use of aspirin: Secondary | ICD-10-CM | POA: Diagnosis not present

## 2014-05-08 DIAGNOSIS — G960 Cerebrospinal fluid leak: Secondary | ICD-10-CM | POA: Diagnosis present

## 2014-05-08 DIAGNOSIS — I10 Essential (primary) hypertension: Secondary | ICD-10-CM | POA: Diagnosis present

## 2014-05-08 DIAGNOSIS — Z87891 Personal history of nicotine dependence: Secondary | ICD-10-CM | POA: Diagnosis not present

## 2014-05-08 DIAGNOSIS — R51 Headache: Secondary | ICD-10-CM | POA: Diagnosis present

## 2014-05-08 DIAGNOSIS — K219 Gastro-esophageal reflux disease without esophagitis: Secondary | ICD-10-CM | POA: Diagnosis present

## 2014-05-08 DIAGNOSIS — E785 Hyperlipidemia, unspecified: Secondary | ICD-10-CM | POA: Diagnosis present

## 2014-05-08 DIAGNOSIS — G9782 Other postprocedural complications and disorders of nervous system: Secondary | ICD-10-CM | POA: Diagnosis present

## 2014-05-08 HISTORY — PX: LUMBAR WOUND DEBRIDEMENT: SHX1988

## 2014-05-08 LAB — GLUCOSE, CAPILLARY: Glucose-Capillary: 92 mg/dL (ref 70–99)

## 2014-05-08 SURGERY — LUMBAR WOUND DEBRIDEMENT
Anesthesia: General | Site: Back

## 2014-05-08 MED ORDER — MORPHINE SULFATE 2 MG/ML IJ SOLN
1.0000 mg | INTRAMUSCULAR | Status: DC | PRN
  Administered 2014-05-09 (×2): 2 mg via INTRAVENOUS
  Filled 2014-05-08 (×2): qty 1

## 2014-05-08 MED ORDER — ROCURONIUM BROMIDE 100 MG/10ML IV SOLN
INTRAVENOUS | Status: DC | PRN
  Administered 2014-05-08: 25 mg via INTRAVENOUS
  Administered 2014-05-08: 15 mg via INTRAVENOUS

## 2014-05-08 MED ORDER — OXYCODONE-ACETAMINOPHEN 5-325 MG PO TABS
2.0000 | ORAL_TABLET | ORAL | Status: DC | PRN
  Administered 2014-05-09 – 2014-05-12 (×13): 2 via ORAL
  Administered 2014-05-12: 1 via ORAL
  Administered 2014-05-12 – 2014-05-13 (×2): 2 via ORAL
  Filled 2014-05-08 (×16): qty 2

## 2014-05-08 MED ORDER — DIAZEPAM 5 MG PO TABS
10.0000 mg | ORAL_TABLET | Freq: Four times a day (QID) | ORAL | Status: DC | PRN
  Administered 2014-05-09 – 2014-05-10 (×2): 10 mg via ORAL
  Filled 2014-05-08 (×2): qty 2

## 2014-05-08 MED ORDER — ACETAMINOPHEN 650 MG RE SUPP: 650.0000 mg | RECTAL | Status: DC | PRN

## 2014-05-08 MED ORDER — ACETAMINOPHEN 325 MG PO TABS
650.0000 mg | ORAL_TABLET | ORAL | Status: DC | PRN
  Administered 2014-05-11: 650 mg via ORAL
  Filled 2014-05-08: qty 2

## 2014-05-08 MED ORDER — ONDANSETRON HCL 4 MG/2ML IJ SOLN
4.0000 mg | INTRAMUSCULAR | Status: DC | PRN
  Administered 2014-05-09: 4 mg via INTRAVENOUS
  Filled 2014-05-08: qty 2

## 2014-05-08 MED ORDER — SODIUM CHLORIDE 0.9 % IV SOLN
INTRAVENOUS | Status: DC
  Administered 2014-05-09: 1000 mL via INTRAVENOUS

## 2014-05-08 MED ORDER — THROMBIN 20000 UNITS EX SOLR
CUTANEOUS | Status: DC | PRN
  Administered 2014-05-08: 20 mL via TOPICAL

## 2014-05-08 MED ORDER — 0.9 % SODIUM CHLORIDE (POUR BTL) OPTIME
TOPICAL | Status: DC | PRN
  Administered 2014-05-08: 1000 mL

## 2014-05-08 MED ORDER — GLYCOPYRROLATE 0.2 MG/ML IJ SOLN
INTRAMUSCULAR | Status: DC | PRN
  Administered 2014-05-08: .6 mg via INTRAVENOUS

## 2014-05-08 MED ORDER — HYDROMORPHONE HCL 1 MG/ML IJ SOLN
INTRAMUSCULAR | Status: AC
  Filled 2014-05-08: qty 1

## 2014-05-08 MED ORDER — DEXAMETHASONE SODIUM PHOSPHATE 4 MG/ML IJ SOLN
INTRAMUSCULAR | Status: DC | PRN
  Administered 2014-05-08: 8 mg via INTRAVENOUS

## 2014-05-08 MED ORDER — SODIUM CHLORIDE 0.9 % IV SOLN: 250.0000 mL | INTRAVENOUS | Status: DC

## 2014-05-08 MED ORDER — ARTIFICIAL TEARS OP OINT
TOPICAL_OINTMENT | OPHTHALMIC | Status: DC | PRN
  Administered 2014-05-08: 1 via OPHTHALMIC

## 2014-05-08 MED ORDER — MIDAZOLAM HCL 5 MG/5ML IJ SOLN
INTRAMUSCULAR | Status: DC | PRN
  Administered 2014-05-08: 2 mg via INTRAVENOUS

## 2014-05-08 MED ORDER — ONDANSETRON HCL 4 MG/2ML IJ SOLN
INTRAMUSCULAR | Status: DC | PRN
  Administered 2014-05-08 (×2): 4 mg via INTRAVENOUS

## 2014-05-08 MED ORDER — SCOPOLAMINE 1 MG/3DAYS TD PT72
MEDICATED_PATCH | TRANSDERMAL | Status: DC | PRN
  Administered 2014-05-08: 1 via TRANSDERMAL

## 2014-05-08 MED ORDER — MENTHOL 3 MG MT LOZG: 1.0000 | LOZENGE | OROMUCOSAL | Status: DC | PRN

## 2014-05-08 MED ORDER — PROMETHAZINE HCL 25 MG/ML IJ SOLN: 6.2500 mg | INTRAMUSCULAR | Status: DC | PRN

## 2014-05-08 MED ORDER — FENTANYL CITRATE 0.05 MG/ML IJ SOLN
INTRAMUSCULAR | Status: DC | PRN
  Administered 2014-05-08 (×2): 50 ug via INTRAVENOUS
  Administered 2014-05-08: 100 ug via INTRAVENOUS
  Administered 2014-05-08: 50 ug via INTRAVENOUS

## 2014-05-08 MED ORDER — NEOSTIGMINE METHYLSULFATE 10 MG/10ML IV SOLN
INTRAVENOUS | Status: DC | PRN
  Administered 2014-05-08: 4 mg via INTRAVENOUS

## 2014-05-08 MED ORDER — HYDROMORPHONE HCL 1 MG/ML IJ SOLN: 0.2500 mg | INTRAMUSCULAR | Status: DC | PRN

## 2014-05-08 MED ORDER — HYDROMORPHONE HCL 1 MG/ML IJ SOLN
0.2500 mg | INTRAMUSCULAR | Status: DC | PRN
  Administered 2014-05-08: 0.25 mg via INTRAVENOUS
  Administered 2014-05-08 (×2): 0.5 mg via INTRAVENOUS
  Administered 2014-05-08: 0.25 mg via INTRAVENOUS

## 2014-05-08 MED ORDER — SODIUM CHLORIDE 0.9 % IJ SOLN
3.0000 mL | Freq: Two times a day (BID) | INTRAMUSCULAR | Status: DC
  Administered 2014-05-09 – 2014-05-10 (×2): 3 mL via INTRAVENOUS

## 2014-05-08 MED ORDER — LIDOCAINE HCL (CARDIAC) 20 MG/ML IV SOLN
INTRAVENOUS | Status: DC | PRN
  Administered 2014-05-08: 70 mg via INTRAVENOUS

## 2014-05-08 MED ORDER — SUCCINYLCHOLINE CHLORIDE 20 MG/ML IJ SOLN
INTRAMUSCULAR | Status: DC | PRN
  Administered 2014-05-08: 90 mg via INTRAVENOUS

## 2014-05-08 MED ORDER — ZOLPIDEM TARTRATE 5 MG PO TABS: 5.0000 mg | ORAL_TABLET | Freq: Every evening | ORAL | Status: DC | PRN

## 2014-05-08 MED ORDER — PHENOL 1.4 % MT LIQD
1.0000 | OROMUCOSAL | Status: DC | PRN
  Filled 2014-05-08: qty 177

## 2014-05-08 MED ORDER — LACTATED RINGERS IV SOLN
INTRAVENOUS | Status: DC | PRN
  Administered 2014-05-08 (×2): via INTRAVENOUS

## 2014-05-08 MED ORDER — CEFAZOLIN SODIUM 1-5 GM-% IV SOLN
1.0000 g | Freq: Three times a day (TID) | INTRAVENOUS | Status: AC
  Administered 2014-05-09 (×2): 1 g via INTRAVENOUS
  Filled 2014-05-08 (×2): qty 50

## 2014-05-08 MED ORDER — SODIUM CHLORIDE 0.9 % IJ SOLN: 3.0000 mL | INTRAMUSCULAR | Status: DC | PRN

## 2014-05-08 MED ORDER — PROPOFOL 10 MG/ML IV BOLUS
INTRAVENOUS | Status: DC | PRN
Start: 2014-05-08 — End: 2014-05-08
  Administered 2014-05-08: 160 mg via INTRAVENOUS

## 2014-05-08 SURGICAL SUPPLY — 60 items
3.0X3.8 NEURO DRILL ×2 IMPLANT
BENZOIN TINCTURE PRP APPL 2/3 (GAUZE/BANDAGES/DRESSINGS) IMPLANT
BLADE CLIPPER SURG (BLADE) IMPLANT
BUR ACORN 6.0 (BURR) ×2 IMPLANT
BUR MATCHSTICK NEURO 3.0 LAGG (BURR) ×2 IMPLANT
CANISTER SUCT 3000ML (MISCELLANEOUS) ×2 IMPLANT
DRAPE LAPAROTOMY 100X72X124 (DRAPES) ×2 IMPLANT
DRAPE POUCH INSTRU U-SHP 10X18 (DRAPES) ×2 IMPLANT
DRSG OPSITE POSTOP 4X6 (GAUZE/BANDAGES/DRESSINGS) ×2 IMPLANT
DURAPREP 26ML APPLICATOR (WOUND CARE) IMPLANT
ELECT REM PT RETURN 9FT ADLT (ELECTROSURGICAL) ×2
ELECTRODE REM PT RTRN 9FT ADLT (ELECTROSURGICAL) ×1 IMPLANT
GAUZE SPONGE 4X4 12PLY STRL (GAUZE/BANDAGES/DRESSINGS) ×2 IMPLANT
GAUZE SPONGE 4X4 16PLY XRAY LF (GAUZE/BANDAGES/DRESSINGS) IMPLANT
GLOVE BIO SURGEON STRL SZ 6.5 (GLOVE) ×2 IMPLANT
GLOVE BIO SURGEON STRL SZ7 (GLOVE) ×2 IMPLANT
GLOVE BIO SURGEON STRL SZ7.5 (GLOVE) IMPLANT
GLOVE BIO SURGEON STRL SZ8 (GLOVE) ×2 IMPLANT
GLOVE BIO SURGEON STRL SZ8.5 (GLOVE) IMPLANT
GLOVE BIOGEL M 8.0 STRL (GLOVE) ×4 IMPLANT
GLOVE ECLIPSE 6.5 STRL STRAW (GLOVE) IMPLANT
GLOVE ECLIPSE 7.0 STRL STRAW (GLOVE) IMPLANT
GLOVE ECLIPSE 7.5 STRL STRAW (GLOVE) IMPLANT
GLOVE ECLIPSE 8.0 STRL XLNG CF (GLOVE) IMPLANT
GLOVE ECLIPSE 8.5 STRL (GLOVE) IMPLANT
GLOVE EXAM NITRILE LRG STRL (GLOVE) IMPLANT
GLOVE EXAM NITRILE MD LF STRL (GLOVE) IMPLANT
GLOVE EXAM NITRILE XL STR (GLOVE) IMPLANT
GLOVE EXAM NITRILE XS STR PU (GLOVE) IMPLANT
GLOVE INDICATOR 6.5 STRL GRN (GLOVE) ×2 IMPLANT
GLOVE INDICATOR 7.0 STRL GRN (GLOVE) IMPLANT
GLOVE INDICATOR 7.5 STRL GRN (GLOVE) ×2 IMPLANT
GLOVE INDICATOR 8.0 STRL GRN (GLOVE) IMPLANT
GLOVE INDICATOR 8.5 STRL (GLOVE) ×2 IMPLANT
GLOVE OPTIFIT SS 8.0 STRL (GLOVE) IMPLANT
GLOVE SURG SS PI 6.5 STRL IVOR (GLOVE) IMPLANT
GOWN STRL REUS W/ TWL LRG LVL3 (GOWN DISPOSABLE) ×3 IMPLANT
GOWN STRL REUS W/ TWL XL LVL3 (GOWN DISPOSABLE) IMPLANT
GOWN STRL REUS W/TWL 2XL LVL3 (GOWN DISPOSABLE) IMPLANT
GOWN STRL REUS W/TWL LRG LVL3 (GOWN DISPOSABLE) ×3
GOWN STRL REUS W/TWL XL LVL3 (GOWN DISPOSABLE)
KIT BASIN OR (CUSTOM PROCEDURE TRAY) ×2 IMPLANT
KIT ROOM TURNOVER OR (KITS) ×2 IMPLANT
NS IRRIG 1000ML POUR BTL (IV SOLUTION) ×2 IMPLANT
PACK LAMINECTOMY NEURO (CUSTOM PROCEDURE TRAY) ×2 IMPLANT
PAD ARMBOARD 7.5X6 YLW CONV (MISCELLANEOUS) ×6 IMPLANT
SPONGE SURGIFOAM ABS GEL SZ50 (HEMOSTASIS) ×2 IMPLANT
STAPLER VISISTAT 35W (STAPLE) ×2 IMPLANT
STRIP CLOSURE SKIN 1/2X4 (GAUZE/BANDAGES/DRESSINGS) IMPLANT
SUT PROLENE 6 0 BV (SUTURE) ×4 IMPLANT
SUT VIC AB 0 CT1 18XCR BRD8 (SUTURE) ×1 IMPLANT
SUT VIC AB 0 CT1 8-18 (SUTURE) ×1
SUT VIC AB 2-0 CP2 18 (SUTURE) ×2 IMPLANT
SUT VIC AB 3-0 SH 8-18 (SUTURE) ×2 IMPLANT
SWAB COLLECTION DEVICE MRSA (MISCELLANEOUS) ×2 IMPLANT
SYR 20ML ECCENTRIC (SYRINGE) ×2 IMPLANT
TOWEL OR 17X24 6PK STRL BLUE (TOWEL DISPOSABLE) ×2 IMPLANT
TOWEL OR 17X26 10 PK STRL BLUE (TOWEL DISPOSABLE) ×2 IMPLANT
TUBE ANAEROBIC SPECIMEN COL (MISCELLANEOUS) ×2 IMPLANT
WATER STERILE IRR 1000ML POUR (IV SOLUTION) ×2 IMPLANT

## 2014-05-08 NOTE — Anesthesia Procedure Notes (Signed)
Procedure Name: MAC Date/Time: 05/08/2014 8:09 PM Performed by: Julianne Rice Z Pre-anesthesia Checklist: Patient identified, Patient being monitored, Emergency Drugs available, Timeout performed and Suction available Patient Re-evaluated:Patient Re-evaluated prior to inductionOxygen Delivery Method: Circle system utilized Preoxygenation: Pre-oxygenation with 100% oxygen Intubation Type: IV induction Ventilation: Mask ventilation without difficulty Laryngoscope Size: Mac and 3 Grade View: Grade I Tube type: Oral Tube size: 7.5 mm Number of attempts: 1 Airway Equipment and Method: Stylet Placement Confirmation: ETT inserted through vocal cords under direct vision,  breath sounds checked- equal and bilateral and positive ETCO2 Secured at: 22 cm Tube secured with: Tape Dental Injury: Teeth and Oropharynx as per pre-operative assessment

## 2014-05-08 NOTE — Transfer of Care (Signed)
Immediate Anesthesia Transfer of Care Note  Patient: [name]  Procedure(s) Performed: Procedure(s): LUMBAR WOUND Exploration (N/A)  Patient Location: PACU  Anesthesia Type:General  Level of Consciousness: awake, alert  and patient cooperative  Airway & Oxygen Therapy: Patient Spontanous Breathing and Patient connected to face mask oxygen  Post-op Assessment: Report given to PACU RN and Post -op Vital signs reviewed and stable  Post vital signs: Reviewed and stable  Complications: No apparent anesthesia complications

## 2014-05-08 NOTE — Progress Notes (Signed)
Terra from radiology called in regards to myelogram this AM that some of patient's medication taken from this AM contraindicated with anesthetics agent therefore myelogram possibly canceled. Babs Sciara will call and notify Dr. Jeral Fruit. Patient is updated.  Sim Boast, RN

## 2014-05-08 NOTE — Progress Notes (Signed)
Patient ID: [name], female   DOB: 04/21/1956, 59 y.o.   MRN: [id number] Re-admitted with csf leak. i did talk to her about doing a myelogram to find the source

## 2014-05-08 NOTE — Progress Notes (Signed)
Per Babs Sciara in Radiology, patient will have the myelogram on Friday morning and to be NPO on Thursday Night. Medication to be held at this time is celexa, ultram, and trazadone. Left message with Dr. Jeral Fruit per Babs Sciara. Patient informed.   Sim Boast, RN

## 2014-05-08 NOTE — Anesthesia Postprocedure Evaluation (Signed)
  Anesthesia Post-op Note  Patient: [name]  Procedure(s) Performed: Procedure(s): LUMBAR WOUND Exploration (N/A)  Patient Location: PACU  Anesthesia Type:General  Level of Consciousness: awake and alert   Airway and Oxygen Therapy: Patient Spontanous Breathing  Post-op Pain: mild  Post-op Assessment: Post-op Vital signs reviewed  Post-op Vital Signs: stable  Last Vitals:  Filed Vitals:   05/08/14 2245  BP: 110/66  Pulse: 68  Temp: 36.5 C  Resp: 12    Complications: No apparent anesthesia complications

## 2014-05-08 NOTE — Anesthesia Preprocedure Evaluation (Signed)
Anesthesia Evaluation  Patient identified by MRN, date of birth, ID band Patient awake    Reviewed: Allergy & Precautions, H&P , NPO status , Patient's Chart, lab work & pertinent test results  History of Anesthesia Complications (+) PONV and history of anesthetic complications  Airway Mallampati: II  TM Distance: >3 FB Neck ROM: Full    Dental  (+) Caps, Dental Advisory Given   Pulmonary former smoker,  breath sounds clear to auscultation  Pulmonary exam normal       Cardiovascular hypertension, Pt. on medications Rhythm:Regular Rate:Normal     Neuro/Psych PSYCHIATRIC DISORDERS  Neuromuscular disease negative psych ROS   GI/Hepatic Neg liver ROS, GERD-  ,  Endo/Other  negative endocrine ROSdiabetes  Renal/GU      Musculoskeletal   Abdominal   Peds  Hematology negative hematology ROS (+)   Anesthesia Other Findings   Reproductive/Obstetrics negative OB ROS                             Anesthesia Physical Anesthesia Plan  ASA: III and emergent  Anesthesia Plan: General ETT   Post-op Pain Management:    Induction: Intravenous  Airway Management Planned: Oral ETT  Additional Equipment:   Intra-op Plan:   Post-operative Plan: Extubation in OR  Informed Consent: I have reviewed the patients History and Physical, chart, labs and discussed the procedure including the risks, benefits and alternatives for the proposed anesthesia with the patient or authorized representative who has indicated his/her understanding and acceptance.     Plan Discussed with: CRNA and Surgeon  Anesthesia Plan Comments:         Anesthesia Quick Evaluation

## 2014-05-08 NOTE — Progress Notes (Signed)
Patient ID: [name], female   DOB: 1955-08-03, 59 y.o.   MRN: [id number] Unable to do a lumbar myelogram  Because she needs to be off desyrel for at least 5 days. i will talk with [name] about doing a lumbar mri today with the possibility of surgery tonight or to wait till Monday. If we go with surgery today she will be in the ICU to be sure she is flat in the postoperative days

## 2014-05-08 NOTE — Progress Notes (Signed)
Patient ID: [name], female   DOB: [birth year], 59 y.o.   MRN: [id number] No family members around

## 2014-05-08 NOTE — Progress Notes (Signed)
Patient ID: [name], female   DOB: 18-Dec-1955, 59 y.o.   MRN: [id number] i went to talk with tonight RN about ms Azucena Kuba bed restrictions

## 2014-05-08 NOTE — H&P (Signed)
[name] is an 59 y.o. female.   Chief Complaint: headache    HPI: patient who had lumbar l5s1 foraminotomy follwed by repair of csf leak. Went home and came back last night with worsening of the headache. Wound is flat mri is positive for fluid in the operative site  Past Medical History  Diagnosis Date  . PONV (postoperative nausea and vomiting)     violent nausea/vomiting  . Hyperlipidemia     takes Atorvastatin daily  . Obesity   . Arthritis   . Anxiety     takes Citalopram daily  . Muscle spasm of back     takes Flexeril daily as needed  . Seasonal allergies     takes Claritin daily  . GERD (gastroesophageal reflux disease)     takes Omeprazole daily  . Hypertension     takes HCTZ daily as well as Propranolol  . Pneumonia     hx of-many,many yrs ago  . History of bronchitis     last yr  . Migraine     hx of  . Rheumatoid arthritis   . Joint pain   . Joint swelling   . Chronic back pain     radiculopathy  . Urinary urgency   . Diabetes mellitus without complication     "pre"  . Insomnia     takes trazodone nightly  . History of shingles     Past Surgical History  Procedure Laterality Date  . Back surgery    . Abdominal hysterectomy  2003  . Cholecystectomy N/A 06/27/2012    Procedure: LAPAROSCOPIC CHOLECYSTECTOMY WITH INTRAOPERATIVE CHOLANGIOGRAM;  Surgeon: Currie Paris, MD;  Location: [facility];  Service: General;  Laterality: N/A;  . Carpal tunnel release Bilateral   . Neck surgery    . Colonoscopy    . Lumbar laminectomy/decompression microdiscectomy Right 04/23/2014    Procedure: Right L5-S1 Laminectomy/Foraminotomy;  Surgeon: Karn Cassis, MD;  Location: [facility];  Service: Neurosurgery;  Laterality: Right;  Right L5-S1 Laminectomy/Foraminotomy  . Lumbar wound debridement N/A 05/01/2014    Procedure: EXPLORATION OF LUMBAR WOUND;  Surgeon: Karn Cassis, MD;  Location: [facility];  Service: Neurosurgery;  Laterality: N/A;  EXPLORATION  OF LUMBAR WOUND    Family History  Problem Relation Age of Onset  . Leukemia Father   . Cancer Father   . Diabetes Maternal Grandmother   . Heart disease Paternal Grandmother   . Colon cancer Paternal Aunt 47  . Rheum arthritis Mother   . Heart disease Mother     2 coronary stents  . Heart disease Maternal Uncle   . Heart disease Maternal Uncle    Social History:  reports that she has quit smoking. Her smoking use included Cigarettes. She smoked 0.00 packs per day. She has never used smokeless tobacco. She reports that she does not drink alcohol or use illicit drugs.  Allergies:  Allergies  Allergen Reactions  . Hydrocodone Itching    Medications Prior to Admission  Medication Sig Dispense Refill  . aspirin EC 81 MG tablet Take 81 mg by mouth daily.    Marland Kitchen atorvastatin (LIPITOR) 20 MG tablet Take 1 tablet (20 mg total) by mouth every evening. 90 tablet 3  . citalopram (CELEXA) 20 MG tablet Take 1 tablet (20 mg total) by mouth daily. 90 tablet 3  . cyclobenzaprine (FLEXERIL) 10 MG tablet Take 1 tablet (10 mg total) by mouth 3 (three) times daily as needed for muscle spasms. 30 tablet  0  . folic acid (FOLVITE) 1 MG tablet Take 3 mg by mouth daily.   1  . gabapentin (NEURONTIN) 300 MG capsule Take 300 mg by mouth 2 (two) times daily.    . hydrochlorothiazide (HYDRODIURIL) 25 MG tablet Take 1 tablet (25 mg total) by mouth daily. 90 tablet 3  . loratadine (CLARITIN) 10 MG tablet Take 1 tablet (10 mg total) by mouth daily. 90 tablet 3  . Methotrexate Sodium, PF, 50 MG/2ML SOLN Inject 25 mg into the skin every Friday.     . naproxen (NAPROSYN) 500 MG tablet Take 500 mg by mouth 2 (two) times daily with a meal.    . omeprazole (PRILOSEC) 20 MG capsule TAKE 1 CAPSULE BY MOUTH DAILY FOR POSSIBLE ACID REFLUX. 30 capsule 1  . ondansetron (ZOFRAN-ODT) 4 MG disintegrating tablet DISSOLVE 1 TABLET ON TONGUE EVERY 8 HOURS AS NEEDED FOR NAUSEA OR VOMITING. 30 tablet 4  . oxyCODONE-acetaminophen  (PERCOCET) 5-325 MG per tablet Take 1-2 tablets by mouth every 4 (four) hours as needed. 40 tablet 0  . potassium bicarbonate (KLOR-CON/EF) 25 MEQ disintegrating tablet Take 1 tablet (25 mEq total) by mouth daily. (Patient taking differently: Take 25 mEq by mouth at bedtime. DISSOLVE IN WATER) 30 tablet 3  . propranolol ER (INDERAL LA) 80 MG 24 hr capsule Take 1 capsule (80 mg total) by mouth daily. 30 capsule 6  . traMADol (ULTRAM) 50 MG tablet Take 1-2 tablets (50-100 mg total) by mouth at bedtime as needed. (Patient taking differently: Take 50-100 mg by mouth at bedtime. ) 90 tablet 1  . traZODone (DESYREL) 100 MG tablet Take 1 -2 tablets every night as needed for sleep. 180 tablet 3    Results for orders placed or performed during the hospital encounter of 05/07/14 (from the past 48 hour(s))  CBC with Differential     Status: None   Collection Time: 05/07/14  9:33 PM  Result Value Ref Range   WBC 9.7 4.0 - 10.5 K/uL   RBC 4.05 3.87 - 5.11 MIL/uL   Hemoglobin 12.2 12.0 - 15.0 g/dL   HCT 94.7 09.6 - 28.3 %   MCV 91.6 78.0 - 100.0 fL   MCH 30.1 26.0 - 34.0 pg   MCHC 32.9 30.0 - 36.0 g/dL   RDW 66.2 94.7 - 65.4 %   Platelets 274 150 - 400 K/uL   Neutrophils Relative % 63 43 - 77 %   Neutro Abs 6.1 1.7 - 7.7 K/uL   Lymphocytes Relative 25 12 - 46 %   Lymphs Abs 2.5 0.7 - 4.0 K/uL   Monocytes Relative 8 3 - 12 %   Monocytes Absolute 0.8 0.1 - 1.0 K/uL   Eosinophils Relative 3 0 - 5 %   Eosinophils Absolute 0.3 0.0 - 0.7 K/uL   Basophils Relative 1 0 - 1 %   Basophils Absolute 0.1 0.0 - 0.1 K/uL  I-stat chem 8, ed     Status: Abnormal   Collection Time: 05/07/14  9:47 PM  Result Value Ref Range   Sodium 137 135 - 145 mmol/L   Potassium 3.1 (L) 3.5 - 5.1 mmol/L   Chloride 96 96 - 112 mEq/L   BUN 29 (H) 6 - 23 mg/dL   Creatinine, Ser 6.50 0.50 - 1.10 mg/dL   Glucose, Bld 354 (H) 70 - 99 mg/dL   Calcium, Ion 6.56 (L) 1.13 - 1.30 mmol/L   TCO2 26 0 - 100 mmol/L   Hemoglobin 13.9  12.0 -  15.0 g/dL   HCT 26.7 12.4 - 58.0 %   Mr Lumbar Spine Wo Contrast  05/08/2014   CLINICAL DATA:  Spinal surgery on 04/23/2014. Postoperative fluid leak and headache.  EXAM: MRI LUMBAR SPINE WITHOUT CONTRAST  TECHNIQUE: Multiplanar, multisequence MR imaging of the lumbar spine was performed. No intravenous contrast was administered.  COMPARISON:  CT 05/01/2014  FINDINGS: There is no abnormality at T11-12, or T12-L1.  L1-2: Shallow central right-sided disc protrusion without neural compression.  L2-3: Circumferential bulging of the disc. Mild facet and ligamentous hypertrophy. No significant stenosis.  L3-4: Disc degeneration with bulging more prominent in the left posterior lateral direction. Facet and ligamentous hypertrophy. Mild stenosis of the left lateral recess and intervertebral foramen on the left without gross neural compression. No change since the preoperative study.  L4-5: Minimal bulging of the disc. Mild facet and ligamentous hypertrophy. No compressive stenosis.  L5-S1: Previous right hemilaminectomy. Persistent fluid collection at the laminectomy site measuring approximately 1.5 cm in diameter. Fluid extending into the superficial subcutaneous fatty tissues measuring approximately 3.9 x 3.0 x 2.1 cm. Persistent moderate foraminal stenosis on the right at this level.  IMPRESSION: No change at L4-5 and above.  Interval right hemilaminectomy at L5-S1. Facet degeneration and hypertrophy right more than left. Right-sided endplate osteophytes and bulging of the disc. Foraminal narrowing on the right that could affect the L5 nerve root. Fluid at the laminectomy site on the right, extending posteriorly into the subcutaneous soft tissues, consistent with CSF leak. Similar appearance to the CT scan of 05/01/2014.   Electronically Signed   By: Paulina Fusi M.D.   On: 05/08/2014 14:45    Review of Systems  Constitutional: Negative.   Respiratory: Negative.   Cardiovascular: Negative.    Gastrointestinal: Negative.   Genitourinary: Negative.   Musculoskeletal: Positive for back pain.  Skin: Negative.   Neurological: Positive for headaches.  Endo/Heme/Allergies: Negative.   Psychiatric/Behavioral: Negative.     Blood pressure 101/63, pulse 58, temperature 98 F (36.7 C), temperature source Oral, resp. rate 17, height 5\' 3"  (1.6 m), weight 95.119 kg (209 lb 11.2 oz), SpO2 96 %. Physical Exam hent, nl. Neck, no stiffness, cv, nl. Lungs, clear. Abdomen, soft. Extremities, nl. NEURO , NORMAL. WOUND FLAT  Assessment/Plan Re-exploration of lumbar wound.  Analyn Matusek M 05/08/2014, 7:50 PM

## 2014-05-09 ENCOUNTER — Encounter (HOSPITAL_COMMUNITY): Payer: Self-pay | Admitting: Neurosurgery

## 2014-05-09 NOTE — Progress Notes (Signed)
Chaplain initiated follow up with pt due to readmission. Pt was visited by pastor yesterday. Chaplain and pt explored feelings of frustration surrounding her injury. Pt is very appreciative of staff here and thinks they are "the best." Chaplain excused herself for pt phone call. Chaplain will continue to follow.   05/09/14 1000  Clinical Encounter Type  Visited With Patient  Visit Type Follow-up;Spiritual support  Recommendations Follow Up  Spiritual Encounters  Spiritual Needs Emotional  Stress Factors  Patient Stress Factors Health changes  Kadynce Bonds, Mayer Masker, Chaplain 05/09/2014 10:11 AM

## 2014-05-09 NOTE — Progress Notes (Signed)
Patient ID: [name], female   DOB: 12-Dec-1955, 59 y.o.   MRN: [id number] Stable. No pain. Told me that prior to surgery she had one epidural injection with 5 to 6 attempts . Also severe post myelogram headache. Continue flat in bed

## 2014-05-09 NOTE — Op Note (Signed)
NAMEALEXZANDRA, BILTON                  ACCOUNT NO.:  [id number]  MEDICAL RECORD NO.:  [id number]  LOCATION:  [id number]                        FACILITY:  [facility]  PHYSICIAN:  Hilda Lias, M.D.   DATE OF BIRTH:  Dec 21, 1955  DATE OF PROCEDURE:  05/08/2014 DATE OF DISCHARGE:                              OPERATIVE REPORT   PREOPERATIVE DIAGNOSIS:  Recurrent postoperative cerebrospinal fluid leak, lumbar.  POSTOPERATIVE DIAGNOSIS:  Recurrent postoperative cerebrospinal fluid leak, lumbar.  PROCEDURES:  Exploration of the lumbar wound.  Removal of the spinous process of L5 and the lamina of L5 in the left side.  Repair of posterolateral left cerebrospinal fluid leak with a single stitch of 6-0 Prolene.  Microscope.  SURGEON:  Hilda Lias, M.D.  ASSISTANT:  Donalee Citrin, M.D.  CLINICAL HISTORY:  Mr. Star underwent right L5-S1 foraminotomy and laminotomy in mid December.  She was readmitted because of CSF leak. During surgery, we found a small arachnoid pouch along the right side below the lamina.  It was repaired without any problem.  Valsalva maneuver was negative.  She was kept flat in bed, but she continued to have some headache.  Eventually, she was discharged on the 4th with no headache.  She came last night complaining more headache.  Repeat MRI showed that still have fluid collection in the area of the surgery.  She was taken to Surgery today.  DESCRIPTION OF PROCEDURE:  After intubation, she was positioned in a prone manner.  The previous staple was removal and the wound was clean as well as the skin with DuraPrep.  Drapes were applied.  Incision was made, and indeed, there was quite a bit of CSF leak in the subcutaneous space.  We brought the microscope into the area.  I explored the area where she had the previous stitch and that was completely clean with no evidence of CSF leak.  The CSF leak that was coming superior and medial. Exploration showed that indeed it was coming  mostly from the left side. Because of that, I proceed with removal of the lamina and the spinous process of L5, and I found in the midline to the left, which goes along with the spinal tap from the myelogram and opening in the dura mater.  A single stitch in figure-of-eight with a 6-0 Prolene was done.  Valsalva maneuver three times was negative.  From then on, the area was irrigated, closed in different layers with Vicryl and the skin with staple.  The patient is going to remain flat in bed for at least 48 hours.          ______________________________ Hilda Lias, M.D.    EB/MEDQ  D:  05/08/2014  T:  05/09/2014  Job:  [id number]

## 2014-05-09 NOTE — Progress Notes (Signed)
Patient voiced that she is uncomfortable and appears agitated with current positioning post surgery per MD order. RN spoke with Dr. Jeral Fruit about how long the patient will be on bedrest and MD verbalized that he will talk to the patient. MD also ok to give celexa since myelogram is canceled. Per patient, Dr. Jeral Fruit expressed to her that she will be on bed rest for 8 hours but no order noted. RN revisited the importance of being on bedrest per MD order and will discuss the time frame of how long when MD round again. Patient verbalized understanding. Will continue to monitor.   Sim Boast, RN

## 2014-05-10 MED ORDER — METHOTREXATE SODIUM CHEMO INJECTION 25 MG/ML
10.0000 mg | Freq: Once | INTRAMUSCULAR | Status: DC
Start: 2014-05-10 — End: 2014-05-10

## 2014-05-10 MED ORDER — ALPRAZOLAM 0.5 MG PO TABS
1.0000 mg | ORAL_TABLET | Freq: Three times a day (TID) | ORAL | Status: DC
  Administered 2014-05-10 – 2014-05-12 (×6): 1 mg via ORAL
  Filled 2014-05-10 (×6): qty 2

## 2014-05-10 NOTE — Progress Notes (Signed)
Pt bed locked at 0 degrees, pt informed of this change.

## 2014-05-10 NOTE — Progress Notes (Signed)
Patient ID: [name], female   DOB: 06-05-1955, 59 y.o.   MRN: [id number] From minus to ten to zero [facility] , she developed headache this am. She is minus 5. Eating with head elevated. She tells me is probably stiffness but not the previous headache. Will check in am

## 2014-05-10 NOTE — Progress Notes (Signed)
Pt complained of headache to NT.  Her bed had been moved from -10 degrees to 0 degrees per order by RN at 0930.  Dr. Jeral Fruit notified.  HOB is now -5 degrees per order.  Pt informed of this change.  She stated, "I'm going to stop telling you when I have a headache."  Offered support.  Will continue to monitor.

## 2014-05-10 NOTE — Progress Notes (Signed)
Patient ID: [name], female   DOB: 10-Oct-1955, 58 y.o.   MRN: [id number] Resting. Plan to keep bef flat

## 2014-05-11 MED ORDER — FLEET ENEMA 7-19 GM/118ML RE ENEM
1.0000 | ENEMA | Freq: Once | RECTAL | Status: AC
  Administered 2014-05-12: 1 via RECTAL
  Filled 2014-05-11: qty 1

## 2014-05-11 NOTE — Progress Notes (Signed)
Patient ID: [name], female   DOB: Aug 02, 1955, 59 y.o.   MRN: [id number] Better, no headache , wound dry. No neck stiffness. Will give an enema tomorrow pm. Plan to discharge her om monday

## 2014-05-11 NOTE — Progress Notes (Signed)
Patient ID: [name], female   DOB: 1955-10-26, 59 y.o.   MRN: [id number] Patient denies any headache Laying with head of bed 5 down She would like to mobilize this morning We'll start with 15 head of bed up Progress to 30 and 60 and 4 hour intervals She also notes that she'll not move her bowels until she is able to get onto the toilet Will continue with progressive mobility today

## 2014-05-12 LAB — URINALYSIS, ROUTINE W REFLEX MICROSCOPIC
Bilirubin Urine: NEGATIVE
Glucose, UA: NEGATIVE mg/dL
Hgb urine dipstick: NEGATIVE
KETONES UR: NEGATIVE mg/dL
Nitrite: NEGATIVE
Protein, ur: NEGATIVE mg/dL
Specific Gravity, Urine: 1.025 (ref 1.005–1.030)
Urobilinogen, UA: 1 mg/dL (ref 0.0–1.0)
pH: 7 (ref 5.0–8.0)

## 2014-05-12 LAB — URINE MICROSCOPIC-ADD ON

## 2014-05-12 MED ORDER — ALPRAZOLAM 0.5 MG PO TABS: 0.5000 mg | ORAL_TABLET | Freq: Two times a day (BID) | ORAL | Status: DC | PRN

## 2014-05-12 MED ORDER — LACTATED RINGERS IV SOLN
INTRAVENOUS | Status: AC
  Administered 2014-05-12: 1000 via INTRAVENOUS

## 2014-05-12 NOTE — Progress Notes (Signed)
Patient ID: [name], female   DOB: [birth year], 59 y.o.   MRN: [id number] Subjective:  The patient is alert and pleasant. She is not having a headache.  Objective: Vital signs in last 24 hours: Temp:  [97.9 F (36.6 C)-100 F (37.8 C)] 98.2 F (36.8 C) (01/10 1012) Pulse Rate:  [66-78] 66 (01/10 1012) Resp:  [18-20] 18 (01/10 1012) BP: (88-116)/(37-68) 113/51 mmHg (01/10 1012) SpO2:  [92 %-99 %] 99 % (01/10 1012)  Intake/Output from previous day:   Intake/Output this shift:    Physical exam patient is alert and oriented. She is moving her lower extremities well. Her wound is healing well without drainage.  Lab Results: No results for input(s): WBC, HGB, HCT, PLT in the last 72 hours. BMET No results for input(s): NA, K, CL, CO2, GLUCOSE, BUN, CREATININE, CALCIUM in the last 72 hours.  Studies/Results: No results found.  Assessment/Plan: Postop day #3 with CSF leak: The patient appears to be recovering well. We will keep her flat until tomorrow and mobilize her tomorrow.  LOS: 5 days     [name] D 05/12/2014, 11:43 AM

## 2014-05-12 NOTE — Progress Notes (Signed)
Patient oxygen saturation at 93 for last 2 vital signs, put her on 2 litre's nasal canula, will continue to monitor.

## 2014-05-12 NOTE — Progress Notes (Signed)
Patient took a short walk in the hallway; feels better; ready for enema; tolerated without distress; reports  BM X 2.  Taking po fluids better threw the afternoon; alerted patient that xanax has been changed to prn only.  Encouraged TCDB, po's, and maintain back precautions.

## 2014-05-12 NOTE — Progress Notes (Signed)
Patient c/o fatique; denies headache; back pain is tolerable; urine sent post foley removal from yesterday due to c/o burning sensation; negative with culture pending; no fever; denies shortness of breath;  On call MD changed xanax to prn only now that patient is off bedrest; encourage po intake; will repeat CBC in am.

## 2014-05-13 LAB — CBC
HCT: 35 % — ABNORMAL LOW (ref 36.0–46.0)
Hemoglobin: 11.2 g/dL — ABNORMAL LOW (ref 12.0–15.0)
MCH: 30 pg (ref 26.0–34.0)
MCHC: 32 g/dL (ref 30.0–36.0)
MCV: 93.8 fL (ref 78.0–100.0)
Platelets: 252 10*3/uL (ref 150–400)
RBC: 3.73 MIL/uL — ABNORMAL LOW (ref 3.87–5.11)
RDW: 15.1 % (ref 11.5–15.5)
WBC: 6.2 10*3/uL (ref 4.0–10.5)

## 2014-05-13 MED FILL — Lactated Ringer's Solution: INTRAVENOUS | Qty: 1000 | Status: AC

## 2014-05-13 NOTE — Progress Notes (Signed)
Patient ID: [name], female   DOB: 27-Nov-1955, 59 y.o.   MRN: [id number] No headache. Ambulating. Dc today

## 2014-05-13 NOTE — Progress Notes (Signed)
Patient is discharged from room 4N15 at this time. Alert and in stable condition. IV site d/c'd and instructions read to patient with understanding verbalized. Left unit via wheelchair with all belongings.

## 2014-05-13 NOTE — Discharge Summary (Signed)
Physician Discharge Summary  Patient ID: [name] MRN: [id number] DOB/AGE: June 29, 1955 59 y.o.  Admit date: 05/07/2014 Discharge date: 05/13/2014  Admission Diagnoses: Postoperative csf at myelogram site Discharge Diagnoses:  Active Problems:   Postoperative CSF leak   CSF leak   Discharged Condition: no headache  Hospital Course: surgery  Consults: none  Significant Diagnostic Studies: mti  Treatments:open closure of duramater at myelogram site with left lamina resection  Discharge Exam: Blood pressure 124/66, pulse 73, temperature 98.1 F (36.7 C), temperature source Oral, resp. rate 18, height 5\' 3"  (1.6 m), weight 95.119 kg (209 lb 11.2 oz), SpO2 100 %. No headache . No weakness  Disposition: home to see me in 10 days    Medication List    ASK your doctor about these medications        aspirin EC 81 MG tablet  Take 81 mg by mouth daily.     atorvastatin 20 MG tablet  Commonly known as:  LIPITOR  Take 1 tablet (20 mg total) by mouth every evening.     citalopram 20 MG tablet  Commonly known as:  CELEXA  Take 1 tablet (20 mg total) by mouth daily.     cyclobenzaprine 10 MG tablet  Commonly known as:  FLEXERIL  Take 1 tablet (10 mg total) by mouth 3 (three) times daily as needed for muscle spasms.     folic acid 1 MG tablet  Commonly known as:  FOLVITE  Take 3 mg by mouth daily.     gabapentin 300 MG capsule  Commonly known as:  NEURONTIN  Take 300 mg by mouth 2 (two) times daily.     hydrochlorothiazide 25 MG tablet  Commonly known as:  HYDRODIURIL  Take 1 tablet (25 mg total) by mouth daily.     loratadine 10 MG tablet  Commonly known as:  CLARITIN  Take 1 tablet (10 mg total) by mouth daily.     Methotrexate Sodium (PF) 50 MG/2ML Soln  Inject 25 mg into the skin every Friday.     naproxen 500 MG tablet  Commonly known as:  NAPROSYN  Take 500 mg by mouth 2 (two) times daily with a meal.     omeprazole 20 MG capsule  Commonly known as:   PRILOSEC  TAKE 1 CAPSULE BY MOUTH DAILY FOR POSSIBLE ACID REFLUX.     ondansetron 4 MG disintegrating tablet  Commonly known as:  ZOFRAN-ODT  DISSOLVE 1 TABLET ON TONGUE EVERY 8 HOURS AS NEEDED FOR NAUSEA OR VOMITING.     oxyCODONE-acetaminophen 5-325 MG per tablet  Commonly known as:  PERCOCET  Take 1-2 tablets by mouth every 4 (four) hours as needed.     potassium bicarbonate 25 MEQ disintegrating tablet  Commonly known as:  KLOR-CON/EF  Take 1 tablet (25 mEq total) by mouth daily.     propranolol ER 80 MG 24 hr capsule  Commonly known as:  INDERAL LA  Take 1 capsule (80 mg total) by mouth daily.     traMADol 50 MG tablet  Commonly known as:  ULTRAM  Take 1-2 tablets (50-100 mg total) by mouth at bedtime as needed.     traZODone 100 MG tablet  Commonly known as:  DESYREL  Take 1 -2 tablets every night as needed for sleep.         Signed: Friday 05/13/2014, 9:09 AM

## 2014-05-14 LAB — URINE CULTURE
Colony Count: 50000
SPECIAL REQUESTS: NORMAL

## 2014-06-14 ENCOUNTER — Other Ambulatory Visit: Payer: Self-pay | Admitting: Internal Medicine

## 2014-06-22 DIAGNOSIS — M069 Rheumatoid arthritis, unspecified: Secondary | ICD-10-CM | POA: Insufficient documentation

## 2014-06-27 ENCOUNTER — Encounter: Payer: Self-pay | Admitting: Emergency Medicine

## 2014-07-19 ENCOUNTER — Other Ambulatory Visit: Payer: Self-pay | Admitting: Physician Assistant

## 2014-07-22 NOTE — Telephone Encounter (Signed)
Dr Dareen Piano, I saw this email from pt when I was looking in chart regarding a RF request on omeprazole. I went ahead and gave RF although pt is overdue for f/up on that. It doesn't look like this message from pt on 06/27/14 was ever forwarded to you. I just wanted you to see it and be aware. I LMOM for pt that she does need f/up for reflux anyway, but advised that it doesn't look like you received her message last month and that she should definitely RTC if she is still feeling as she was then.

## 2014-07-22 NOTE — Telephone Encounter (Signed)
Pt CB and stated that she did get a call back and advised her to f/up with regular provider. She normally sees Dr Perrin Maltese or Maralyn Sago and she tried to get in one day last week, but it was a 3 hr wait. She stated that she really needs to get in to see someone who she can get in to see when needed and reported that she is even having SI. I called next door to see if there are any openings for today - none today or tomorrow. I advised pt to RTC today and that Dr Perrin Maltese will be here until 4, so if she gets in by 2 pm she should be able to see him, but that another provider can also see her. Strongly advised pt to get somewhere immediately (here or ER) if she worsens or has SI. Pt stated she will try to get here today.  Dr Perrin Maltese, I will forward this to you since you are listed as pt's PCP and Maralyn Sago is not here today. Wanted you to be aware.

## 2014-07-25 ENCOUNTER — Emergency Department (HOSPITAL_COMMUNITY)
Admission: EM | Admit: 2014-07-25 | Discharge: 2014-07-26 | Disposition: A | Discharge status: Other Acute Inpatient Hospital | Payer: BLUE CROSS/BLUE SHIELD | Attending: Emergency Medicine | Admitting: Emergency Medicine

## 2014-07-25 ENCOUNTER — Ambulatory Visit (INDEPENDENT_AMBULATORY_CARE_PROVIDER_SITE_OTHER): Payer: BLUE CROSS/BLUE SHIELD | Admitting: Family Medicine

## 2014-07-25 ENCOUNTER — Encounter (HOSPITAL_COMMUNITY): Payer: Self-pay | Admitting: Emergency Medicine

## 2014-07-25 VITALS — BP 132/90 | HR 65 | Temp 97.8°F | Resp 17 | Ht 62.0 in | Wt 219.4 lb

## 2014-07-25 DIAGNOSIS — Z8701 Personal history of pneumonia (recurrent): Secondary | ICD-10-CM | POA: Diagnosis not present

## 2014-07-25 DIAGNOSIS — E669 Obesity, unspecified: Secondary | ICD-10-CM | POA: Insufficient documentation

## 2014-07-25 DIAGNOSIS — R45851 Suicidal ideations: Secondary | ICD-10-CM

## 2014-07-25 DIAGNOSIS — Z8719 Personal history of other diseases of the digestive system: Secondary | ICD-10-CM | POA: Insufficient documentation

## 2014-07-25 DIAGNOSIS — Z046 Encounter for general psychiatric examination, requested by authority: Secondary | ICD-10-CM | POA: Diagnosis present

## 2014-07-25 DIAGNOSIS — E119 Type 2 diabetes mellitus without complications: Secondary | ICD-10-CM | POA: Insufficient documentation

## 2014-07-25 DIAGNOSIS — F411 Generalized anxiety disorder: Secondary | ICD-10-CM | POA: Diagnosis not present

## 2014-07-25 DIAGNOSIS — F322 Major depressive disorder, single episode, severe without psychotic features: Secondary | ICD-10-CM | POA: Diagnosis present

## 2014-07-25 DIAGNOSIS — M1990 Unspecified osteoarthritis, unspecified site: Secondary | ICD-10-CM | POA: Diagnosis not present

## 2014-07-25 DIAGNOSIS — G8929 Other chronic pain: Secondary | ICD-10-CM | POA: Diagnosis not present

## 2014-07-25 DIAGNOSIS — I10 Essential (primary) hypertension: Secondary | ICD-10-CM | POA: Insufficient documentation

## 2014-07-25 LAB — COMPREHENSIVE METABOLIC PANEL
ALT: 39 U/L — ABNORMAL HIGH (ref 0–35)
AST: 22 U/L (ref 0–37)
Albumin: 3.9 g/dL (ref 3.5–5.2)
Alkaline Phosphatase: 92 U/L (ref 39–117)
Anion gap: 9 (ref 5–15)
BUN: 13 mg/dL (ref 6–23)
CO2: 27 mmol/L (ref 19–32)
Calcium: 8.8 mg/dL (ref 8.4–10.5)
Chloride: 103 mmol/L (ref 96–112)
Creatinine, Ser: 0.67 mg/dL (ref 0.50–1.10)
GFR calc Af Amer: >90 mL/min (ref >90)
GFR calc non Af Amer: >90 mL/min (ref >90)
Glucose, Bld: 98 mg/dL (ref 70–99)
Potassium: 3.3 mmol/L — ABNORMAL LOW (ref 3.5–5.1)
Sodium: 139 mmol/L (ref 135–145)
Total Bilirubin: 0.5 mg/dL (ref 0.3–1.2)
Total Protein: 6.8 g/dL (ref 6.0–8.3)

## 2014-07-25 LAB — SALICYLATE LEVEL: Salicylate Lvl: <4 mg/dL (ref 2.8–20.0)

## 2014-07-25 LAB — CBC
HCT: 36.1 % (ref 36.0–46.0)
Hemoglobin: 11.8 g/dL — ABNORMAL LOW (ref 12.0–15.0)
MCH: 29.9 pg (ref 26.0–34.0)
MCHC: 32.7 g/dL (ref 30.0–36.0)
MCV: 91.6 fL (ref 78.0–100.0)
Platelets: 301 10*3/uL (ref 150–400)
RBC: 3.94 MIL/uL (ref 3.87–5.11)
RDW: 15.4 % (ref 11.5–15.5)
WBC: 6.6 10*3/uL (ref 4.0–10.5)

## 2014-07-25 LAB — RAPID URINE DRUG SCREEN, HOSP PERFORMED
Amphetamines: NOT DETECTED
Barbiturates: NOT DETECTED
Benzodiazepines: POSITIVE — AB
Cocaine: NOT DETECTED
Opiates: NOT DETECTED
Tetrahydrocannabinol: NOT DETECTED

## 2014-07-25 LAB — ETHANOL: Alcohol, Ethyl (B): <5 mg/dL (ref 0–9)

## 2014-07-25 LAB — ACETAMINOPHEN LEVEL: Acetaminophen (Tylenol), Serum: <10 ug/mL — ABNORMAL LOW (ref 10–30)

## 2014-07-25 MED ORDER — NAPROXEN 500 MG PO TABS
500.0000 mg | ORAL_TABLET | Freq: Two times a day (BID) | ORAL | Status: DC
  Administered 2014-07-26 (×2): 500 mg via ORAL
  Filled 2014-07-25 (×2): qty 1

## 2014-07-25 MED ORDER — LORATADINE 10 MG PO TABS
10.0000 mg | ORAL_TABLET | Freq: Every day | ORAL | Status: DC
  Administered 2014-07-25 – 2014-07-26 (×2): 10 mg via ORAL
  Filled 2014-07-25 (×2): qty 1

## 2014-07-25 MED ORDER — METHOTREXATE SODIUM CHEMO INJECTION 25 MG/ML
25.0000 mg | INTRAMUSCULAR | Status: DC
  Filled 2014-07-25: qty 1

## 2014-07-25 MED ORDER — ASPIRIN EC 81 MG PO TBEC
81.0000 mg | DELAYED_RELEASE_TABLET | Freq: Every day | ORAL | Status: DC
  Administered 2014-07-25 – 2014-07-26 (×2): 81 mg via ORAL
  Filled 2014-07-25 (×2): qty 1

## 2014-07-25 MED ORDER — CITALOPRAM HYDROBROMIDE 20 MG PO TABS
20.0000 mg | ORAL_TABLET | Freq: Every day | ORAL | Status: DC
  Administered 2014-07-25 – 2014-07-26 (×2): 20 mg via ORAL
  Filled 2014-07-25 (×2): qty 1

## 2014-07-25 MED ORDER — ATORVASTATIN CALCIUM 20 MG PO TABS
20.0000 mg | ORAL_TABLET | Freq: Every evening | ORAL | Status: DC
  Administered 2014-07-25 – 2014-07-26 (×2): 20 mg via ORAL
  Filled 2014-07-25 (×2): qty 1

## 2014-07-25 MED ORDER — TRAMADOL HCL 50 MG PO TABS: 50.0000 mg | ORAL_TABLET | Freq: Every evening | ORAL | Status: DC | PRN

## 2014-07-25 MED ORDER — TRAZODONE HCL 50 MG PO TABS
50.0000 mg | ORAL_TABLET | Freq: Every evening | ORAL | Status: DC | PRN
  Administered 2014-07-25: 50 mg via ORAL
  Filled 2014-07-25: qty 1

## 2014-07-25 MED ORDER — OXYCODONE-ACETAMINOPHEN 5-325 MG PO TABS
1.0000 | ORAL_TABLET | ORAL | Status: DC | PRN
  Administered 2014-07-25: 2 via ORAL
  Filled 2014-07-25: qty 2

## 2014-07-25 MED ORDER — METHOTREXATE SODIUM CHEMO INJECTION (PF) 50 MG/2ML: 25.0000 mg | INTRAMUSCULAR | Status: DC

## 2014-07-25 MED ORDER — ONDANSETRON 4 MG PO TBDP: 4.0000 mg | ORAL_TABLET | Freq: Three times a day (TID) | ORAL | Status: DC | PRN

## 2014-07-25 MED ORDER — GABAPENTIN 300 MG PO CAPS
300.0000 mg | ORAL_CAPSULE | Freq: Two times a day (BID) | ORAL | Status: DC
  Administered 2014-07-25 – 2014-07-26 (×3): 300 mg via ORAL
  Filled 2014-07-25 (×3): qty 1

## 2014-07-25 MED ORDER — PROPRANOLOL HCL ER 80 MG PO CP24
80.0000 mg | ORAL_CAPSULE | Freq: Every day | ORAL | Status: DC
  Administered 2014-07-25 – 2014-07-26 (×2): 80 mg via ORAL
  Filled 2014-07-25 (×2): qty 1

## 2014-07-25 MED ORDER — CYCLOBENZAPRINE HCL 10 MG PO TABS: 10.0000 mg | ORAL_TABLET | Freq: Three times a day (TID) | ORAL | Status: DC | PRN

## 2014-07-25 MED ORDER — NAPROXEN SODIUM 220 MG PO CAPS: 2.0000 | ORAL_CAPSULE | Freq: Every day | ORAL | Status: DC | PRN

## 2014-07-25 MED ORDER — HYDROCHLOROTHIAZIDE 25 MG PO TABS
25.0000 mg | ORAL_TABLET | Freq: Every day | ORAL | Status: DC
Start: 2014-07-25 — End: 2014-07-26
  Administered 2014-07-25 – 2014-07-26 (×2): 25 mg via ORAL
  Filled 2014-07-25 (×2): qty 1

## 2014-07-25 MED ORDER — FOLIC ACID 1 MG PO TABS
3.0000 mg | ORAL_TABLET | Freq: Every day | ORAL | Status: DC
  Administered 2014-07-25 – 2014-07-26 (×2): 3 mg via ORAL
  Filled 2014-07-25 (×2): qty 3

## 2014-07-25 MED ORDER — NAPROXEN 500 MG PO TABS: 500.0000 mg | ORAL_TABLET | Freq: Every day | ORAL | Status: DC | PRN

## 2014-07-25 MED ORDER — POTASSIUM CHLORIDE CRYS ER 20 MEQ PO TBCR
20.0000 meq | EXTENDED_RELEASE_TABLET | Freq: Every day | ORAL | Status: DC
  Administered 2014-07-25 – 2014-07-26 (×2): 20 meq via ORAL
  Filled 2014-07-25 (×2): qty 1

## 2014-07-25 NOTE — [facility] Counselor (Signed)
There are 2 walk-in Pt at [facility]. Pt will be seen after those Pts.  Assessment Complete   [name], [facility] Assessment Counselor 07/25/2014 7:13 PM

## 2014-07-25 NOTE — ED Notes (Signed)
TTS consult in progress. °

## 2014-07-25 NOTE — ED Notes (Signed)
Patient in bed watching TV at this time, Respirations equal and unlabored, skin warm and dry, NAD. No change in assessment or acuity. Q 15 minute safety checks remain in place.

## 2014-07-25 NOTE — Progress Notes (Signed)
[facility] [address] [place] [address] [phone]  Date:  07/25/2014   Name:  [name]   DOB:  09-18-1955   MRN:  [id number]  PCP:  Tally Due, MD    Chief Complaint: Medication Problem   History of Present Illness:  [name] is a 59 y.o. very pleasant female patient who presents with the following:  I last saw this pt in September of 2015.  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   Here today because "I'm having a nervous breakdown."  She was dx with fibromyalgia in October as well as RA. She had surgery for her spinal stenosis on 12/23, then was readmitted twice and had repeat operations for a CSF leak.  Most recently in January of this year.  This series of events seemed to trigger the start of current depressive episode  She states that she is still bothered a lot by pain in the joints of her hands.  She states that "I felt suicidal" over the weekend.  She will see a psychiatrist next week; she does not remember the name of the doctor she is going to see.   She states that she is crying "all the time, I cannot concentrate or do my job."  She left work early yesterday and did not go today  She is having headaches again- she keeps worrying that this could be a CSF leak again.  She has not had depression in the past.  States that she is generally a very strong and independent person so this is very different from her baseline She had come by clinic a few days ago but the wait was too long so she left and tried to deal with her sx on her own.  .   She has felt so depressed for about one week, but has been going downhill since her surgical problems.  Denies any specific plans for self harm but states that she has really considered hurting herself, "I'm not sure why i'm here anymore.  I knew if I went home I would just stay in bed all weekend.  My son ([place]) is worried about me.  I knew that I had  to get some help."   Patient Active Problem List   Diagnosis Date Noted  . CSF leak 05/07/2014  . Postoperative CSF leak 05/01/2014  . 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 Fibromyalgia 02/19/2014  . Hypokalemia 09/18/2013  . Family history of ischemic heart disease   . Chest pain at rest 09/17/2013  . Episode of dizziness after NTG while walking to BR 09/17/2013  . Chest pain 09/17/2013  . Inflammatory arthritis 09/12/2013  . Elevated cholesterol 04/11/2013  . Obesity, Class II, BMI 35-39.9, with comorbidity 03/14/2013  . Elevated liver enzymes 11/23/2012  . Generalized anxiety disorder 08/23/2012  . Insomnia 08/14/2012  . Pain in joint, lower leg 08/14/2012  . HTN (hypertension) 06/16/2012  . Migraine, unspecified, without mention of intractable migraine without mention of status migrainosus 06/16/2012    Past Medical History  Diagnosis Date  . PONV (postoperative nausea and vomiting)     violent nausea/vomiting  . Hyperlipidemia     takes Atorvastatin daily  . Obesity   . Arthritis   . Anxiety     takes Citalopram daily  . Muscle spasm of back     takes Flexeril daily as needed  . Seasonal allergies  takes Claritin daily  . GERD (gastroesophageal reflux disease)     takes Omeprazole daily  . Hypertension     takes HCTZ daily as well as Propranolol  . Pneumonia     hx of-many,many yrs ago  . History of bronchitis     last yr  . Migraine     hx of  . Rheumatoid arthritis   . Joint pain   . Joint swelling   . Chronic back pain     radiculopathy  . Urinary urgency   . Diabetes mellitus without complication     "pre"  . Insomnia     takes trazodone nightly  . History of shingles     Past Surgical History  Procedure Laterality Date  . Back surgery    . Abdominal hysterectomy  2003  . Cholecystectomy N/A 06/27/2012    Procedure: LAPAROSCOPIC CHOLECYSTECTOMY WITH INTRAOPERATIVE CHOLANGIOGRAM;  Surgeon: Currie Paris, MD;  Location: [facility]  ORS;  Service: General;  Laterality: N/A;  . Carpal tunnel release Bilateral   . Neck surgery    . Colonoscopy    . Lumbar laminectomy/decompression microdiscectomy Right 04/23/2014    Procedure: Right L5-S1 Laminectomy/Foraminotomy;  Surgeon: Karn Cassis, MD;  [facility]: [facility];  Service: Neurosurgery;  Laterality: Right;  Right L5-S1 Laminectomy/Foraminotomy  . Lumbar wound debridement N/A 05/01/2014    Procedure: EXPLORATION OF LUMBAR WOUND;  Surgeon: Karn Cassis, MD;  Location: [facility];  Service: Neurosurgery;  Laterality: N/A;  EXPLORATION OF LUMBAR WOUND  . Lumbar wound debridement N/A 05/08/2014    Procedure: LUMBAR WOUND Exploration;  Surgeon: Karn Cassis, MD;  Location: [facility];  Service: Neurosurgery;  Laterality: N/A;    History  Substance Use Topics  . Smoking status: Former Smoker    Types: Cigarettes  . Smokeless tobacco: Never Used     Comment: quit smoking 68yrs ago  . Alcohol Use: No    Family History  Problem Relation Age of Onset  . Leukemia Father   . Cancer Father   . Diabetes Maternal Grandmother   . Heart disease Paternal Grandmother   . Colon cancer Paternal Aunt 25  . Rheum arthritis Mother   . Heart disease Mother     2 coronary stents  . Heart disease Maternal Uncle   . Heart disease Maternal Uncle     Allergies  Allergen Reactions  . Hydrocodone Itching    Medication list has been reviewed and updated.  Current Outpatient Prescriptions on File Prior to Visit  Medication Sig Dispense Refill  . aspirin EC 81 MG tablet Take 81 mg by mouth daily.    Marland Kitchen atorvastatin (LIPITOR) 20 MG tablet Take 1 tablet (20 mg total) by mouth every evening. 90 tablet 3  . citalopram (CELEXA) 20 MG tablet Take 1 tablet (20 mg total) by mouth daily. 90 tablet 3  . cyclobenzaprine (FLEXERIL) 10 MG tablet Take 1 tablet (10 mg total) by mouth 3 (three) times daily as needed for muscle spasms. 30 tablet 0  . folic acid (FOLVITE) 1 MG tablet  Take 3 mg by mouth daily.   1  . gabapentin (NEURONTIN) 300 MG capsule Take 300 mg by mouth 2 (two) times daily.    . hydrochlorothiazide (HYDRODIURIL) 25 MG tablet Take 1 tablet (25 mg total) by mouth daily. 90 tablet 3  . loratadine (CLARITIN) 10 MG tablet Take 1 tablet (10 mg total) by mouth daily. 90 tablet 3  . Methotrexate Sodium, PF, 50  MG/2ML SOLN Inject 25 mg into the skin every Friday.     . naproxen (NAPROSYN) 500 MG tablet Take 500 mg by mouth 2 (two) times daily with a meal.    . omeprazole (PRILOSEC) 20 MG capsule Take 1 capsule (20 mg total) by mouth daily. NO MORE REFILLS WITHOUT OFFICE VISIT - 2ND NOTICE 15 capsule 0  . ondansetron (ZOFRAN-ODT) 4 MG disintegrating tablet DISSOLVE 1 TABLET ON TONGUE EVERY 8 HOURS AS NEEDED FOR NAUSEA OR VOMITING. 30 tablet 4  . oxyCODONE-acetaminophen (PERCOCET) 5-325 MG per tablet Take 1-2 tablets by mouth every 4 (four) hours as needed. 40 tablet 0  . potassium bicarbonate (KLOR-CON/EF) 25 MEQ disintegrating tablet Take 1 tablet (25 mEq total) by mouth daily. (Patient taking differently: Take 25 mEq by mouth at bedtime. DISSOLVE IN WATER) 30 tablet 3  . propranolol ER (INDERAL LA) 80 MG 24 hr capsule Take 1 capsule (80 mg total) by mouth daily. 30 capsule 6  . traMADol (ULTRAM) 50 MG tablet Take 1-2 tablets (50-100 mg total) by mouth at bedtime as needed. (Patient taking differently: Take 50-100 mg by mouth at bedtime. ) 90 tablet 1  . traZODone (DESYREL) 100 MG tablet Take 1 -2 tablets every night as needed for sleep. 180 tablet 3   No current facility-administered medications on file prior to visit.    Review of Systems:  As per HPI- otherwise negative.   Physical Examination: Filed Vitals:   07/25/14 1451  BP: 132/90  Pulse: 65  Temp: 97.8 F (36.6 C)  Resp: 17   Filed Vitals:   07/25/14 1451  Height: 5\' 2"  (1.575 m)  Weight: 219 lb 6.4 oz (99.519 kg)   Body mass index is 40.12 kg/(m^2). Ideal Body Weight: Weight in (lb) to  have BMI = 25: 136.4  GEN: WDWN, NAD, Non-toxic, A & O x 3, obese, well dressed and groomed but tearful HEENT: Atraumatic, Normocephalic. Neck supple. No masses, No LAD. Ears and Nose: No external deformity. CV: RRR, No M/G/R. No JVD. No thrill. No extra heart sounds. PULM: CTA B, no wheezes, crackles, rhonchi. No retractions. No resp. distress. No accessory muscle use. EXTR: No c/c/e NEURO Normal gait.  PSYCH: Normally interactive. Conversant.   Wt Readings from Last 3 Encounters:  07/25/14 219 lb 6.4 oz (99.519 kg)  05/07/14 209 lb 11.2 oz (95.119 kg)  05/01/14 218 lb (98.884 kg)   Assessment and Plan: Suicidal ideation  Anyiah has recently been though some tough months with spine surgery and then CSF leak x2.  She states that she has been feeling suicidal for the last few days. She does not want to harm herself so she is here today seeking help. She plans to go to her office which is very nearby and ask her friend there to accompany her to the hospital.  Advised her to be evaluated at [facility]- she may need admission to [facility]  Signed [facility], MD

## 2014-07-25 NOTE — BH Assessment (Signed)
Pt presents to ED with pain and suicidal ideation. Per Dr.Copland's note: [name] has recently been though some tough months with spine surgery and then CSF leak x2. She states that she has been feeling suicidal for the last few days. She does not want to harm herself so she is here today seeking help. She plans to go to her office which is very nearby and ask her friend there to accompany her to the hospital. Advised her to be evaluated at [facility]- she may need admission to [facility]  Assessment to commence shortly.    Clista Bernhardt, [facility] Triage Specialist 07/25/2014 10:18 PM

## 2014-07-25 NOTE — ED Notes (Addendum)
Patient ambulatory to treatment area via 43 with a steady gait.  Patient admits to [facility] with no plan. Patient denies HI, and AVH at this time. Patient reports she has felt depressed for about one week, but has been going downhill since her surgical problems. Patient also expresses hopelessness due to chronic pain and recent traumatic loss of sister and mother. Respirations equal and unlabored, skin warm and dry. NAD. Patient verbally contracts for safety while on unit. Patient oriented to unit and plan of care discuss with patient. Patient voices no concerns at this time. Encouragement and support provided and safety maintain. Q 15 min checks in place.
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
## 2014-07-25 NOTE — Patient Instructions (Signed)
Please proceed to the ER at [facility] for further evaluation.   I hope that everything goes well for you and that you are feeling better soon! [address] [place], [place], [place] [address]  Phone: [phone]

## 2014-07-25 NOTE — ED Notes (Signed)
Patient and belongings wanded 

## 2014-07-25 NOTE — ED Notes (Signed)
Pt c/o hopelessness and suicidal ideation without intent due to chronic pain and recent traumatic loss of sister and mother. Pt states she has no energy and just wants to sleep all day. Pt denies any active intentions to commit suicide.

## 2014-07-26 ENCOUNTER — Inpatient Hospital Stay (HOSPITAL_COMMUNITY)
Admission: AD | Admit: 2014-07-26 | Discharge: 2014-07-29 | DRG: 885 | Disposition: A | Discharge status: Home / Self Care | Payer: Federal, State, Local not specified - Other | Source: Intra-hospital | Attending: Psychiatry | Admitting: Psychiatry

## 2014-07-26 ENCOUNTER — Encounter (HOSPITAL_COMMUNITY): Payer: Self-pay | Admitting: *Deleted

## 2014-07-26 DIAGNOSIS — Z833 Family history of diabetes mellitus: Secondary | ICD-10-CM

## 2014-07-26 DIAGNOSIS — Z87891 Personal history of nicotine dependence: Secondary | ICD-10-CM

## 2014-07-26 DIAGNOSIS — J302 Other seasonal allergic rhinitis: Secondary | ICD-10-CM

## 2014-07-26 DIAGNOSIS — I10 Essential (primary) hypertension: Secondary | ICD-10-CM | POA: Diagnosis present

## 2014-07-26 DIAGNOSIS — R45851 Suicidal ideations: Secondary | ICD-10-CM | POA: Diagnosis present

## 2014-07-26 DIAGNOSIS — Z806 Family history of leukemia: Secondary | ICD-10-CM | POA: Diagnosis not present

## 2014-07-26 DIAGNOSIS — E7800 Pure hypercholesterolemia, unspecified: Secondary | ICD-10-CM

## 2014-07-26 DIAGNOSIS — M1990 Unspecified osteoarthritis, unspecified site: Secondary | ICD-10-CM | POA: Diagnosis present

## 2014-07-26 DIAGNOSIS — G8929 Other chronic pain: Secondary | ICD-10-CM | POA: Diagnosis present

## 2014-07-26 DIAGNOSIS — F322 Major depressive disorder, single episode, severe without psychotic features: Secondary | ICD-10-CM

## 2014-07-26 DIAGNOSIS — M797 Fibromyalgia: Secondary | ICD-10-CM | POA: Diagnosis present

## 2014-07-26 DIAGNOSIS — Z811 Family history of alcohol abuse and dependence: Secondary | ICD-10-CM | POA: Diagnosis not present

## 2014-07-26 DIAGNOSIS — F411 Generalized anxiety disorder: Secondary | ICD-10-CM | POA: Diagnosis present

## 2014-07-26 DIAGNOSIS — Z90710 Acquired absence of both cervix and uterus: Secondary | ICD-10-CM | POA: Diagnosis not present

## 2014-07-26 DIAGNOSIS — E785 Hyperlipidemia, unspecified: Secondary | ICD-10-CM | POA: Diagnosis present

## 2014-07-26 DIAGNOSIS — M069 Rheumatoid arthritis, unspecified: Secondary | ICD-10-CM | POA: Diagnosis present

## 2014-07-26 DIAGNOSIS — K219 Gastro-esophageal reflux disease without esophagitis: Secondary | ICD-10-CM | POA: Diagnosis present

## 2014-07-26 DIAGNOSIS — M064 Inflammatory polyarthropathy: Secondary | ICD-10-CM | POA: Diagnosis present

## 2014-07-26 DIAGNOSIS — E119 Type 2 diabetes mellitus without complications: Secondary | ICD-10-CM | POA: Diagnosis present

## 2014-07-26 DIAGNOSIS — Z8249 Family history of ischemic heart disease and other diseases of the circulatory system: Secondary | ICD-10-CM

## 2014-07-26 DIAGNOSIS — E876 Hypokalemia: Secondary | ICD-10-CM

## 2014-07-26 DIAGNOSIS — G4700 Insomnia, unspecified: Secondary | ICD-10-CM | POA: Diagnosis present

## 2014-07-26 MED ORDER — NAPROXEN 500 MG PO TABS: 500.0000 mg | ORAL_TABLET | Freq: Every day | ORAL | Status: DC | PRN

## 2014-07-26 MED ORDER — FOLIC ACID 1 MG PO TABS
3.0000 mg | ORAL_TABLET | Freq: Every day | ORAL | Status: DC
  Administered 2014-07-27 – 2014-07-29 (×3): 3 mg via ORAL
  Filled 2014-07-26 (×5): qty 3

## 2014-07-26 MED ORDER — ALUM & MAG HYDROXIDE-SIMETH 200-200-20 MG/5ML PO SUSP: 30.0000 mL | ORAL | Status: DC | PRN

## 2014-07-26 MED ORDER — GABAPENTIN 300 MG PO CAPS
300.0000 mg | ORAL_CAPSULE | Freq: Two times a day (BID) | ORAL | Status: DC
  Administered 2014-07-27 – 2014-07-29 (×5): 300 mg via ORAL
  Filled 2014-07-26: qty 6
  Filled 2014-07-26 (×3): qty 1
  Filled 2014-07-26: qty 6
  Filled 2014-07-26 (×4): qty 1

## 2014-07-26 MED ORDER — CITALOPRAM HYDROBROMIDE 20 MG PO TABS
20.0000 mg | ORAL_TABLET | Freq: Every day | ORAL | Status: DC
  Administered 2014-07-27: 20 mg via ORAL
  Filled 2014-07-26 (×2): qty 1

## 2014-07-26 MED ORDER — ATORVASTATIN CALCIUM 20 MG PO TABS
20.0000 mg | ORAL_TABLET | Freq: Every evening | ORAL | Status: DC
  Administered 2014-07-27 – 2014-07-28 (×2): 20 mg via ORAL
  Filled 2014-07-26 (×4): qty 1

## 2014-07-26 MED ORDER — PANTOPRAZOLE SODIUM 40 MG PO TBEC
40.0000 mg | DELAYED_RELEASE_TABLET | Freq: Two times a day (BID) | ORAL | Status: DC
  Administered 2014-07-27 – 2014-07-29 (×5): 40 mg via ORAL
  Filled 2014-07-26 (×10): qty 1

## 2014-07-26 MED ORDER — LORATADINE 10 MG PO TABS
10.0000 mg | ORAL_TABLET | Freq: Every day | ORAL | Status: DC
  Administered 2014-07-27 – 2014-07-29 (×3): 10 mg via ORAL
  Filled 2014-07-26 (×5): qty 1

## 2014-07-26 MED ORDER — NAPROXEN 500 MG PO TABS
500.0000 mg | ORAL_TABLET | Freq: Two times a day (BID) | ORAL | Status: DC
  Administered 2014-07-27 – 2014-07-29 (×5): 500 mg via ORAL
  Filled 2014-07-26 (×9): qty 1

## 2014-07-26 MED ORDER — CYCLOBENZAPRINE HCL 10 MG PO TABS: 10.0000 mg | ORAL_TABLET | Freq: Three times a day (TID) | ORAL | Status: DC | PRN

## 2014-07-26 MED ORDER — TRAMADOL HCL 50 MG PO TABS: 50.0000 mg | ORAL_TABLET | Freq: Every evening | ORAL | Status: DC | PRN

## 2014-07-26 MED ORDER — POTASSIUM CHLORIDE CRYS ER 20 MEQ PO TBCR
20.0000 meq | EXTENDED_RELEASE_TABLET | Freq: Every day | ORAL | Status: DC
  Administered 2014-07-27 – 2014-07-29 (×3): 20 meq via ORAL
  Filled 2014-07-26 (×5): qty 1

## 2014-07-26 MED ORDER — PROPRANOLOL HCL ER 80 MG PO CP24
80.0000 mg | ORAL_CAPSULE | Freq: Every day | ORAL | Status: DC
  Administered 2014-07-27 – 2014-07-29 (×3): 80 mg via ORAL
  Filled 2014-07-26 (×5): qty 1

## 2014-07-26 MED ORDER — TRAZODONE HCL 50 MG PO TABS
50.0000 mg | ORAL_TABLET | Freq: Every evening | ORAL | Status: DC | PRN
  Administered 2014-07-26 – 2014-07-28 (×3): 50 mg via ORAL
  Filled 2014-07-26: qty 3
  Filled 2014-07-26 (×3): qty 1

## 2014-07-26 MED ORDER — OXYCODONE-ACETAMINOPHEN 5-325 MG PO TABS
1.0000 | ORAL_TABLET | Freq: Four times a day (QID) | ORAL | Status: DC | PRN
Start: 2014-07-26 — End: 2014-07-27

## 2014-07-26 MED ORDER — ACETAMINOPHEN 325 MG PO TABS: 650.0000 mg | ORAL_TABLET | Freq: Four times a day (QID) | ORAL | Status: DC | PRN

## 2014-07-26 MED ORDER — HYDROCHLOROTHIAZIDE 25 MG PO TABS
25.0000 mg | ORAL_TABLET | Freq: Every day | ORAL | Status: DC
  Administered 2014-07-27 – 2014-07-29 (×3): 25 mg via ORAL
  Filled 2014-07-26 (×5): qty 1

## 2014-07-26 MED ORDER — MAGNESIUM HYDROXIDE 400 MG/5ML PO SUSP: 30.0000 mL | Freq: Every day | ORAL | Status: DC | PRN

## 2014-07-26 MED ORDER — ONDANSETRON 4 MG PO TBDP
4.0000 mg | ORAL_TABLET | Freq: Three times a day (TID) | ORAL | Status: DC | PRN
Start: 2014-07-26 — End: 2014-07-29

## 2014-07-26 MED ORDER — ASPIRIN EC 81 MG PO TBEC
81.0000 mg | DELAYED_RELEASE_TABLET | Freq: Every day | ORAL | Status: DC
  Administered 2014-07-27 – 2014-07-29 (×3): 81 mg via ORAL
  Filled 2014-07-26 (×5): qty 1

## 2014-07-26 NOTE — ED Notes (Signed)
Patient resting quietly in bed with eyes closed, Respirations equal and unlabored, skin warm and dry, NAD. No change in assessment or acuity. Q 15 minute safety checks remain in place.   

## 2014-07-26 NOTE — BH Assessment (Signed)
Inpt recommended. No appropriate beds per [facility]. TTS seeking placement: [facility] [place], [place] Triage Specialist 07/26/2014 5:50 AM

## 2014-07-26 NOTE — ED Notes (Signed)
Pt transported to [facility] by [facility] transportation service for continuation of specialized care. Belongings given to driver after patient signed for them. She left in no acute distress. 

## 2014-07-26 NOTE — ED Notes (Signed)
Patient denies SI, HI and AVH at this time but admits to depression which has improved since yesterday. Patient reports that she slept better on the hospital bed. Patient reports that she is optimistic that things will get better. Plan of care discuss with patient and patient voices no concerns at this time. Encouragement and support provided and safety maintain. Q 15 min safety checks remain in place.

## 2014-07-26 NOTE — ED Notes (Signed)
Pt asleep at time of vitals. RN notified and was told to take it when pt awakes.

## 2014-07-26 NOTE — BH Assessment (Signed)
Pt accepted to [facility] 403-1 to be transported after 7pm. Voluntary paperwork faxed to [facility].   Rollen Sox, MA, LPCA, LCASA Therapeutic Triage Specialist [facility]

## 2014-07-26 NOTE — ED Notes (Signed)
Hospital bed arrived. 

## 2014-07-26 NOTE — BH Assessment (Signed)
[facility] Assessment Progress Note  The following facilities have been contacted in an effort to place this pt, with results as noted:  Beds available, information faxed, decision pending:  [facility]  At capacity:  [facility] [place]  Pt declined:  [facility] (does not meet clinical criteria; recommended their Partial program)  Doylene Canning, [place] Triage Specialist 07/26/2014 @ 15:43

## 2014-07-26 NOTE — ED Notes (Signed)
Patient has chronic back pain adn has had back surgery in January and complains that the bed in the unit is very uncomfortable. Extra pillows has been given to patient. Pillows has only has minimal relief. Writer spoke with Bunnie Pion and Hulan Fess, NP and informed them that I would like to get a hospital bed for patient to ease pain and discomfort. AC and NP in agreement with plan of care. Hospital bed request. Q 15 min safety checks remain in place.

## 2014-07-26 NOTE — Tx Team (Addendum)
Initial Interdisciplinary Treatment Plan   PATIENT STRESSORS: Medical issues - pain   PATIENT STRENGTHS: Ability for insight Supportive children General fund of knowledge Average or above average intelligent   PROBLEM LIST: Problem List/Patient Goals Date to be addressed Date deferred Reason deferred Estimated date of resolution  Depression      Pain       "just want to live a quality life as I used to".      Suicide risk  07/27/2014                                    DISCHARGE CRITERIA:  Improved stability in mood, thinking, and/or behavior Motivation to continue treatment in a less acute level of care Verbal commitment to aftercare and medication compliance.  PRELIMINARY DISCHARGE PLAN: Outpatient therapy Attend aftercare/conyinuing care group  PATIENT/FAMIILY INVOLVEMENT: This treatment plan has been presented to and reviewed with the patient, [name], and/or family member.  The patient and family have been given the opportunity to ask questions and make suggestions.  Glenice Laine B 07/26/2014, 11:15 PM

## 2014-07-26 NOTE — Consult Note (Signed)
[facility] Consult   Reason for Consult:  Depression, suicidal ideations Referring Physician:  EDP Patient Identification: [name] MRN:  [id number] Principal Diagnosis: Suicidal ideations Diagnosis:   Patient Active Problem List   Diagnosis Date Noted  . Depression, major, single episode, severe [F32.2] 07/26/2014    Priority: High  . Suicidal ideations [R45.851] 07/26/2014    Priority: High  . Generalized anxiety disorder [F41.1] 08/23/2012    Priority: High  . 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 Fibromyalgia [M79.7] 02/19/2014  . Hypokalemia [E87.6] 09/18/2013  . Family history of ischemic heart disease [Z82.49]   . Chest pain at rest [R07.9] 09/17/2013  . Episode of dizziness after NTG while walking to BR [R42] 09/17/2013  . Chest pain [R07.9] 09/17/2013  . Inflammatory arthritis [M06.4] 09/12/2013  . Elevated cholesterol [E78.0] 04/11/2013  . 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 Elevated liver enzymes [R74.8] 11/23/2012  . Insomnia [G47.00] 08/14/2012  . Pain in joint, lower leg [M25.569] 08/14/2012  . HTN (hypertension) [I10] 06/16/2012  . Migraine, unspecified, without mention of intractable migraine without mention of status migrainosus [G43.909] 06/16/2012    Total Time spent with patient: 45 minutes  Subjective:   [name] is a 59 y.o. female patient admitted with depression, suicidal ideations.  HPI:  The patient has been hopeless, "no quality of life" for the past year due to her diagnosis of fibromyalgia and RA.  "I can't handle it anymore, I want to lay down and die."  No prior psychiatric history or treatment.  She is isolating, sleeping too much.  Last week-end, she stayed in her room from Friday until Monday.  Her ADLs are being neglected.  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  The oxycodone is making her forgetful and "fogging my head."  She is tired of taking a pill to try and fix things.  [name] would like to get therapy and coping skills to deal with her issues without pain medications.  [name] remains suicidal, crying all well, no energy.  Denies homicidal ideations, hallucinations, and alcohol/drug abuse. HPI Elements:   Location:  generalized . Quality:  acute. Severity:  severe. Timing:  constant. Duration:  couple of weeks. Context:  medical issues, stressors.  Past Medical History:  Past Medical History  Diagnosis Date  . PONV (postoperative nausea and vomiting)     violent nausea/vomiting  . Hyperlipidemia     takes Atorvastatin daily  . Obesity   . Arthritis   . Anxiety     takes Citalopram daily  . Muscle spasm of back     takes Flexeril daily as needed  . Seasonal allergies     takes Claritin daily  . GERD (gastroesophageal reflux disease)     takes Omeprazole daily  . Hypertension     takes HCTZ daily as well as Propranolol  . Pneumonia     hx of-many,many yrs ago  . History of bronchitis     last yr  . Migraine     hx of  . Rheumatoid arthritis   . Joint pain   . Joint swelling   . Chronic back pain     radiculopathy  . Urinary urgency   . Diabetes mellitus without complication     "pre"  . Insomnia     takes trazodone nightly  .  History of shingles     Past Surgical History  Procedure Laterality Date  . Back surgery    . Abdominal hysterectomy  2003  . Cholecystectomy N/A 06/27/2012    Procedure: LAPAROSCOPIC CHOLECYSTECTOMY WITH INTRAOPERATIVE CHOLANGIOGRAM;  Surgeon: Haywood Lasso, MD;  Location: [facility];  Service: General;  Laterality: N/A;  . Carpal tunnel release Bilateral   . Neck surgery    . Colonoscopy    . Lumbar laminectomy/decompression microdiscectomy Right 04/23/2014    Procedure: Right L5-S1 Laminectomy/Foraminotomy;  Surgeon: Floyce Stakes, MD;  Location: [facility];   Service: Neurosurgery;  Laterality: Right;  Right L5-S1 Laminectomy/Foraminotomy  . Lumbar wound debridement N/A 05/01/2014    Procedure: EXPLORATION OF LUMBAR WOUND;  Surgeon: Floyce Stakes, MD;  Location: [facility];  Service: Neurosurgery;  Laterality: N/A;  EXPLORATION OF LUMBAR WOUND  . Lumbar wound debridement N/A 05/08/2014    Procedure: LUMBAR WOUND Exploration;  Surgeon: Floyce Stakes, MD;  Location: [facility];  Service: Neurosurgery;  Laterality: N/A;   Family History:  Family History  Problem Relation Age of Onset  . Leukemia Father   . Cancer Father   . Diabetes Maternal Grandmother   . Heart disease Paternal Grandmother   . Colon cancer Paternal Aunt 11  . Rheum arthritis Mother   . Heart disease Mother     2 coronary stents  . Heart disease Maternal Uncle   . Heart disease Maternal Uncle    Social History:  History  Alcohol Use No     History  Drug Use No    History   Social History  . Marital Status: Single    Spouse Name: N/A  . Number of Children: N/A  . Years of Education: N/A   Social History Main Topics  . Smoking status: Former Smoker    Types: Cigarettes  . Smokeless tobacco: Never Used     Comment: quit smoking 61yrs ago  . Alcohol Use: No  . Drug Use: No  . Sexual Activity: Not on file   Other Topics Concern  . None   Social History Narrative   Additional Social History:    Pain Medications: SEE PTA, denies abuse, reports does not always take all the medication prescribed to her Prescriptions: SEE PTA Over the Counter: SEE PTA History of alcohol / drug use?: No history of alcohol / drug abuse Longest period of sobriety (when/how long): NA Negative Consequences of Use:  (NA) Withdrawal Symptoms:  (NA)                     Allergies:   Allergies  Allergen Reactions  . Hydrocodone Itching    Labs:  Results for orders placed or performed during the hospital encounter of 07/25/14 (from the past 48 hour(s))  Urine  Drug Screen     Status: Abnormal   Collection Time: 07/25/14  4:57 PM  Result Value Ref Range   Opiates NONE DETECTED NONE DETECTED   Cocaine NONE DETECTED NONE DETECTED   Benzodiazepines POSITIVE (A) NONE DETECTED   Amphetamines NONE DETECTED NONE DETECTED   Tetrahydrocannabinol NONE DETECTED NONE DETECTED   Barbiturates NONE DETECTED NONE DETECTED    Comment:        DRUG SCREEN FOR MEDICAL PURPOSES ONLY.  IF CONFIRMATION IS NEEDED FOR ANY PURPOSE, NOTIFY LAB WITHIN 5 DAYS.        LOWEST DETECTABLE LIMITS FOR URINE DRUG SCREEN Drug Class       Cutoff (  ng/mL) Amphetamine      1000 Barbiturate      200 Benzodiazepine   937 Tricyclics       169 Opiates          300 Cocaine          300 THC              50   Acetaminophen level     Status: Abnormal   Collection Time: 07/25/14  5:55 PM  Result Value Ref Range   Acetaminophen (Tylenol), Serum <10.0 (L) 10 - 30 ug/mL    Comment:        THERAPEUTIC CONCENTRATIONS VARY SIGNIFICANTLY. A RANGE OF 10-30 ug/mL MAY BE AN EFFECTIVE CONCENTRATION FOR MANY PATIENTS. HOWEVER, SOME ARE BEST TREATED AT CONCENTRATIONS OUTSIDE THIS RANGE. ACETAMINOPHEN CONCENTRATIONS >150 ug/mL AT 4 HOURS AFTER INGESTION AND >50 ug/mL AT 12 HOURS AFTER INGESTION ARE OFTEN ASSOCIATED WITH TOXIC REACTIONS.   Ethanol (ETOH)     Status: None   Collection Time: 07/25/14  5:55 PM  Result Value Ref Range   Alcohol, Ethyl (B) <5 0 - 9 mg/dL    Comment:        LOWEST DETECTABLE LIMIT FOR SERUM ALCOHOL IS 11 mg/dL FOR MEDICAL PURPOSES ONLY   Salicylate level     Status: None   Collection Time: 07/25/14  5:55 PM  Result Value Ref Range   Salicylate Lvl <6.7 2.8 - 20.0 mg/dL  CBC     Status: Abnormal   Collection Time: 07/25/14  5:56 PM  Result Value Ref Range   WBC 6.6 4.0 - 10.5 K/uL   RBC 3.94 3.87 - 5.11 MIL/uL   Hemoglobin 11.8 (L) 12.0 - 15.0 g/dL   HCT 36.1 36.0 - 46.0 %   MCV 91.6 78.0 - 100.0 fL   MCH 29.9 26.0 - 34.0 pg   MCHC 32.7 30.0 -  36.0 g/dL   RDW 15.4 11.5 - 15.5 %   Platelets 301 150 - 400 K/uL  Comprehensive metabolic panel     Status: Abnormal   Collection Time: 07/25/14  5:56 PM  Result Value Ref Range   Sodium 139 135 - 145 mmol/L   Potassium 3.3 (L) 3.5 - 5.1 mmol/L   Chloride 103 96 - 112 mmol/L   CO2 27 19 - 32 mmol/L   Glucose, Bld 98 70 - 99 mg/dL   BUN 13 6 - 23 mg/dL   Creatinine, Ser 0.67 0.50 - 1.10 mg/dL   Calcium 8.8 8.4 - 10.5 mg/dL   Total Protein 6.8 6.0 - 8.3 g/dL   Albumin 3.9 3.5 - 5.2 g/dL   AST 22 0 - 37 U/L   ALT 39 (H) 0 - 35 U/L   Alkaline Phosphatase 92 39 - 117 U/L   Total Bilirubin 0.5 0.3 - 1.2 mg/dL   GFR calc non Af Amer >90 >90 mL/min   GFR calc Af Amer >90 >90 mL/min    Comment: (NOTE) The eGFR has been calculated using the CKD EPI equation. This calculation has not been validated in all clinical situations. eGFR's persistently <90 mL/min signify possible Chronic Kidney Disease.    Anion gap 9 5 - 15    Vitals: Blood pressure 135/75, pulse 70, temperature 97 F (36.1 C), temperature source Oral, resp. rate 17, SpO2 99 %.  Risk to Self: Suicidal Ideation: Yes-Currently Present Suicidal Intent:  (pt is unsure) Is patient at risk for suicide?: Yes Suicidal Plan?: No Access to Means: Yes Specify Access  to Suicidal Means: reports has numerous pills in her home but does not think she would take them  What has been your use of drugs/alcohol within the last 12 months?: none How many times?: 0 Other Self Harm Risks: none Triggers for Past Attempts: None known Intentional Self Injurious Behavior: None Risk to Others: Homicidal Ideation: No Thoughts of Harm to Others: No Current Homicidal Intent: No Current Homicidal Plan: No Access to Homicidal Means: No Identified Victim: none History of harm to others?: No Assessment of Violence: None Noted Violent Behavior Description: none Does patient have access to weapons?: No Criminal Charges Pending?: No Does patient  have a court date: No Prior Inpatient Therapy: Prior Inpatient Therapy: No Prior Therapy Dates: NA Prior Therapy Facilty/Provider(s): NA Reason for Treatment: NA Prior Outpatient Therapy: Prior Outpatient Therapy: No Prior Therapy Dates: NA Prior Therapy Facilty/Provider(s): NA Reason for Treatment: NA  Current Facility-Administered Medications  Medication Dose Route Frequency Provider Last Rate Last Dose  . aspirin EC tablet 81 mg  81 mg Oral Daily Virgel Manifold, MD   81 mg at 07/26/14 0920  . atorvastatin (LIPITOR) tablet 20 mg  20 mg Oral QPM Virgel Manifold, MD   20 mg at 07/25/14 2135  . citalopram (CELEXA) tablet 20 mg  20 mg Oral Daily Virgel Manifold, MD   20 mg at 07/26/14 0920  . cyclobenzaprine (FLEXERIL) tablet 10 mg  10 mg Oral TID PRN Virgel Manifold, MD      . folic acid (FOLVITE) tablet 3 mg  3 mg Oral Daily Virgel Manifold, MD   3 mg at 07/26/14 0920  . gabapentin (NEURONTIN) capsule 300 mg  300 mg Oral BID Virgel Manifold, MD   300 mg at 07/26/14 0919  . hydrochlorothiazide (HYDRODIURIL) tablet 25 mg  25 mg Oral Daily Virgel Manifold, MD   25 mg at 07/26/14 0919  . loratadine (CLARITIN) tablet 10 mg  10 mg Oral Daily Virgel Manifold, MD   10 mg at 07/26/14 0920  . naproxen (NAPROSYN) tablet 500 mg  500 mg Oral BID WC Virgel Manifold, MD   500 mg at 07/26/14 4235  . naproxen (NAPROSYN) tablet 500 mg  500 mg Oral Daily PRN Virgel Manifold, MD      . ondansetron (ZOFRAN-ODT) disintegrating tablet 4 mg  4 mg Oral Q8H PRN Virgel Manifold, MD      . oxyCODONE-acetaminophen (PERCOCET/ROXICET) 5-325 MG per tablet 1-2 tablet  1-2 tablet Oral Q4H PRN Virgel Manifold, MD   2 tablet at 07/25/14 2141  . potassium chloride SA (K-DUR,KLOR-CON) CR tablet 20 mEq  20 mEq Oral Daily Virgel Manifold, MD   20 mEq at 07/26/14 0920  . propranolol ER (INDERAL LA) 24 hr capsule 80 mg  80 mg Oral Daily Virgel Manifold, MD   80 mg at 07/26/14 0920  . traMADol (ULTRAM) tablet 50-100 mg  50-100 mg Oral QHS PRN Virgel Manifold,  MD      . traZODone (DESYREL) tablet 50 mg  50 mg Oral QHS PRN Virgel Manifold, MD   50 mg at 07/25/14 2350   Current Outpatient Prescriptions  Medication Sig Dispense Refill  . aspirin EC 81 MG tablet Take 81 mg by mouth daily.    Marland Kitchen atorvastatin (LIPITOR) 20 MG tablet Take 1 tablet (20 mg total) by mouth every evening. 90 tablet 3  . citalopram (CELEXA) 20 MG tablet Take 1 tablet (20 mg total) by mouth daily. 90 tablet 3  . cyclobenzaprine (FLEXERIL) 10 MG tablet Take 1 tablet (  10 mg total) by mouth 3 (three) times daily as needed for muscle spasms. 30 tablet 0  . folic acid (FOLVITE) 1 MG tablet Take 3 mg by mouth daily.   1  . gabapentin (NEURONTIN) 300 MG capsule Take 300 mg by mouth 2 (two) times daily.    . hydrochlorothiazide (HYDRODIURIL) 25 MG tablet Take 1 tablet (25 mg total) by mouth daily. 90 tablet 3  . loratadine (CLARITIN) 10 MG tablet Take 1 tablet (10 mg total) by mouth daily. 90 tablet 3  . naproxen (NAPROSYN) 500 MG tablet Take 500 mg by mouth 2 (two) times daily with a meal.    . Naproxen Sodium 220 MG CAPS Take 2 capsules by mouth daily as needed (pain).     Marland Kitchen omeprazole (PRILOSEC) 20 MG capsule Take 1 capsule (20 mg total) by mouth daily. NO MORE REFILLS WITHOUT OFFICE VISIT - 2ND NOTICE 15 capsule 0  . ondansetron (ZOFRAN-ODT) 4 MG disintegrating tablet DISSOLVE 1 TABLET ON TONGUE EVERY 8 HOURS AS NEEDED FOR NAUSEA OR VOMITING. 30 tablet 4  . oxyCODONE-acetaminophen (PERCOCET) 5-325 MG per tablet Take 1-2 tablets by mouth every 4 (four) hours as needed. 40 tablet 0  . potassium bicarbonate (KLOR-CON/EF) 25 MEQ disintegrating tablet Take 1 tablet (25 mEq total) by mouth daily. (Patient taking differently: Take 25 mEq by mouth at bedtime. DISSOLVE IN WATER) 30 tablet 3  . propranolol ER (INDERAL LA) 80 MG 24 hr capsule Take 1 capsule (80 mg total) by mouth daily. 30 capsule 6  . traMADol (ULTRAM) 50 MG tablet Take 1-2 tablets (50-100 mg total) by mouth at bedtime as needed.  (Patient taking differently: Take 50-100 mg by mouth at bedtime. ) 90 tablet 1  . traZODone (DESYREL) 100 MG tablet Take 1 -2 tablets every night as needed for sleep. 180 tablet 3  . Methotrexate Sodium, PF, 50 MG/2ML SOLN Inject 25 mg into the skin every Friday.       Musculoskeletal: Strength & Muscle Tone: within normal limits Gait & Station: normal Patient leans: N/A  Psychiatric Specialty Exam:     Blood pressure 135/75, pulse 70, temperature 97 F (36.1 C), temperature source Oral, resp. rate 17, SpO2 99 %.There is no weight on file to calculate BMI.  General Appearance: Casual  Eye Contact::  Fair  Speech:  Normal Rate  Volume:  Normal  Mood:  Depressed  Affect:  Congruent  Thought Process:  Coherent  Orientation:  Full (Time, Place, and Person)  Thought Content:  Rumination  Suicidal Thoughts:  Yes.  with intent/plan  Homicidal Thoughts:  No  Memory:  Immediate;   Fair Recent;   Fair Remote;   Fair  Judgement:  Impaired  Insight:  Fair  Psychomotor Activity:  Decreased  Concentration:  Fair  Recall:  [employer] of Knowledge:Good  Language: Good  Akathisia:  No  Handed:  Right  AIMS (if indicated):     Assets:  Housing Leisure Time Physical Health Resilience Social Support Vocational/Educational  ADL's:  Intact  Cognition: WNL  Sleep:      Medical Decision Making: Review of Psycho-Social Stressors (1), Review or order clinical lab tests (1) and Review of Medication Regimen & Side Effects (2)  Treatment Plan Summary: Daily contact with patient to assess and evaluate symptoms and progress in treatment, Medication management and Plan admit to inpatient for stabilization  Plan:  Recommend psychiatric Inpatient admission when medically cleared. Disposition: Eloise Levels, PMH-NP 07/26/2014 2:59 PM

## 2014-07-26 NOTE — ED Notes (Signed)
Pt resting quietly in bed with eyes closed. Respirations are even and unlabored. No acute distress noted. Safety has been maintained with q15 minute observation. Will continue current POC 

## 2014-07-26 NOTE — ED Notes (Signed)
Patient resting quietly in bed watching TV, Respirations equal and unlabored, skin warm and dry, NAD. No change in assessment or acuity. Q 15 minute safety checks remain in place.

## 2014-07-27 DIAGNOSIS — F322 Major depressive disorder, single episode, severe without psychotic features: Principal | ICD-10-CM

## 2014-07-27 MED ORDER — DULOXETINE HCL 30 MG PO CPEP
30.0000 mg | ORAL_CAPSULE | Freq: Every day | ORAL | Status: DC
  Administered 2014-07-28 – 2014-07-29 (×2): 30 mg via ORAL
  Filled 2014-07-27 (×3): qty 1
  Filled 2014-07-27: qty 3
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
## 2014-07-27 NOTE — Progress Notes (Signed)
Patient on the unit in her room on approach.  Patient pleasant and cooperative.  Patient states she has had 3 major back surgeries in the the first of the year.  Patient states the currently bed in her room is uncomfortable.  Patient passive SI but verbally contracts for safety.  Patient denies HI and denies AVH.  Patient states she only needs sleep mediations tonight.

## 2014-07-27 NOTE — Progress Notes (Signed)
D:Patient in the hallway on approach.  Patient states she had a good day.  Patient states she talked to someone today about alternative measures for pain.  Patient took a Designer, multimedia and she states it felt good.  Patient pleasant and cooperative. Patient denies SI/HI and denies AVH. A: Staff to monitor Q 15 mins for safety.  Encouragement and support offered. No scheduled medications administered per orders.  Trazodone administered prn for sleep. R: Patient remains safe on the unit.  Patient attended group tonight.  Patient visible on the unit and interacting with peers.  Patient taking administered medications.
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
## 2014-07-27 NOTE — Progress Notes (Signed)
.  Psychoeducational Group Note    Date: 07/27/2014 Time:  1000  Goal Setting Purpose of Group: To be able to set a goal that is measurable and that can be accomplished in one day Participation Level:  Active  Participation Quality:  Attentive  Affect:  Appropriate  Cognitive:  Appropriate  Insight:  Improving  Engagement in Group:  Engaged  Additional Comments:  Pt attending the group, participating and providing feedback   Lenice Koper A 
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
## 2014-07-27 NOTE — Progress Notes (Signed)
Psychoeducational Group Note  Date: 07/27/2014 Time:  1015  Group Topic/Focus:  Identifying Needs:   The focus of this group is to help patients identify their personal needs that have been historically problematic and identify healthy behaviors to address their needs.  Participation Level:  Active  Participation Quality:  Appropriate  Affect:  Appropriate  Cognitive:  Oriented  Insight:  Improving  Engagement in Group:  Engaged  Additional Comments:  Pt was engaged in the group and partisipated  [name] A  

## 2014-07-27 NOTE — [facility] Group Notes (Signed)
[facility] Group Notes:  (Clinical Social Work)  07/27/2014   1:15-2:15PM  Summary of Progress/Problems:   The main focus of today's process group was for patients to identify healthy coping skills they may choose to learn in order to aid in their own mental health wellness/recovery.  The Stages of Change were explained to the group using a handout, and patients identified where they are with regard to changing self-defeating behaviors.  In the discussion, it was noted that every person mentioned not only a healthy coping skill they would like to adopt, but also an unhealthy coping skill they are seeking to replace.  The patient expressed that she wants to have more occupational therapy activities in place to learn how to cope with her pain and substitute actions in order to reduce pain, rather than using opiates.  Type of Therapy:  Process Group  Participation Level:  Active  Participation Quality:  Attentive, Sharing and Supportive  Affect:  Blunted  Cognitive:  Appropriate and Oriented  Insight:  Engaged  Engagement in Therapy:  Engaged  Modes of Intervention:  Education, Motivational Interviewing   Davine Sweney, LCSW 07/27/2014, 4:00pm

## 2014-07-27 NOTE — Progress Notes (Signed)
D) Pt states that she is doing ok but continues to have pain. Wants to have an opportunity to learn more about alternative modalities for her pain. Rates her depression at a 5, hopelessness at a 5 and anxiety at a 7. Pt has attended the program and interacts with her peers. Denies SI and HI. A) Given support, reassurance and praise. Provided with a 1:1. Encouragement and praise. Given. R) Denies SI and HI.

## 2014-07-28 DIAGNOSIS — F322 Major depressive disorder, single episode, severe without psychotic features: Secondary | ICD-10-CM | POA: Insufficient documentation

## 2014-07-28 MED ORDER — NON FORMULARY: Status: DC | PRN

## 2014-07-28 MED ORDER — ALBUTEROL SULFATE 108 (90 BASE) MCG/ACT IN AEPB: 1.0000 | INHALATION_SPRAY | Freq: Four times a day (QID) | RESPIRATORY_TRACT | Status: DC | PRN

## 2014-07-28 NOTE — [facility] Group Notes (Signed)
[facility] Group Notes:  (Clinical Social Work)  07/28/2014   1:15-2:15PM  Summary of Progress/Problems:  The main focus of today's process group was to   identify the patient's current support system and decide on other supports that can be put in place.  The picture on workbook was used to discuss why additional supports are needed.  An emphasis was placed on using counselor, doctor, therapy groups, 12-step groups, and problem-specific support groups to expand supports.   There was also an extensive discussion about what constitutes a healthy support versus an unhealthy support.  Using music as one support was discussed, and several songs were played as a demonstration.  Also discussed the importance of educating oneself about one's diagnosis in order to be able to garner support from others for same.  The patient expressed full comprehension of the concepts presented.    Type of Therapy:  Process Group  Participation Level:  Active  Participation Quality:  Attentive and Sharing  Affect:  Blunted  Cognitive:  Appropriate and Oriented  Insight:  Engaged  Engagement in Therapy:  Engaged  Modes of Intervention:  Education,  Support and Processing  Adeana Grilliot Grossman-Orr, LCSW 07/28/2014, 2:15pm   

## 2014-07-28 NOTE — Progress Notes (Signed)
D:Patient in the dayroom working on a puzzle on approach.  Patient states she is going to be discharged tomorrow.  Patient states, "It's time for me to go."  Patient states she is going to try some alternative methods to get rid of her pain.  Patient denies SI/HI and denies AVH.   A: Staff to monitor Q 15 mins for safety.  Encouragement and support offered.  No scheduled medications administered per orders.  Trazodone administered prn for sleep R: Patient remains safe on the unit.  Patient attended group tonight.  Patient visible on the unit and interacting with peers.  Patient taking administered medications.

## 2014-07-28 NOTE — Progress Notes (Signed)
Psychoeducational Group Note  Date:  07/28/2014 Time: 1015 Group Topic/Focus:  Making Healthy Choices:   The focus of this group is to help patients identify negative/unhealthy choices they were using prior to admission and identify positive/healthier coping strategies to replace them upon discharge.  Participation Level:  Did Not Attend   Additional Comments:    Rich Brave 10:55 AM. 07/28/2014

## 2014-07-28 NOTE — Progress Notes (Signed)
[name] presents to the med window first thing this morning and says " ok..I'm  ready to go!!!). She makes good eye contact. SHe is calm, direct and rational. She says " Ive got my follow up planned, I need to go home so I can get some help with my pain issues...and besides, I'm ready and I need to go.".   A She attends her morning group and she is engaged in her therapy, wanting to go home ASAP and see therapist and get help managing her acute and chronic pain. She speaks with ehr physician and it is decided that she will potentially be discharged home tomorrow.   R Safety is in place and poc cont.

## 2014-07-28 NOTE — Progress Notes (Signed)
Adult Psychoeducational Group Note  Date:  07/27/2014 Time:  2000  Group Topic/Focus:  Wrap-Up Group:   The focus of this group is to help patients review their daily goal of treatment and discuss progress on daily workbooks.  Participation Level:  Active  Participation Quality:  Attentive  Affect:  Appropriate  Cognitive:  Appropriate  Insight: Good  Engagement in Group:  Engaged  Modes of Intervention:  Discussion  Additional Comments:  Pt goal was to speak with case worker and doctor about her after care plans. Pt has accomplished her by speaking with both.  Casilda Carls 07/28/2014, 3:36 AM

## 2014-07-28 NOTE — Progress Notes (Addendum)
[facility] MD Progress Note  07/28/2014 12:04 PM [name]  MRN:  [id number] Subjective:  Patient states she is feeling better, less depressed, and states she feels " clearer and less cloudy". Has not developed any opiate withdrawal symptoms and states she feels better off them, particularly more clear. Pain is chronic but has not worsened off the narcotics.   Objective: At this time patient is much improved - she states she feels " much better, really back to normal". In particular stresses she no longer feels clouded or slowed cognitively. As noted, she is now off opiates and has not developed any opiate WDL symptoms. Thus far she is tolerating Cymbalta and Neurontin well, denies side effects. On unit calm, pleasant, going to groups. Currently patient is focused on being discharged soon, stating that she feels " good " and wanting to return to work and to her normal daily activities.  Principal Problem:  Depression, consider MDD versus Depression secondary to chronic pain Diagnosis:   Patient Active Problem List   Diagnosis Date Noted  . Depression, major, single episode, severe [F32.2] 07/26/2014  . Suicidal ideations [R45.851] 07/26/2014  . 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 Fibromyalgia [M79.7] 02/19/2014  . Hypokalemia [E87.6] 09/18/2013  . Family history of ischemic heart disease [Z82.49]   . Chest pain at rest [R07.9] 09/17/2013  . Episode of dizziness after NTG while walking to [place] [R42] 09/17/2013  . Chest pain [R07.9] 09/17/2013  . Inflammatory arthritis [M06.4] 09/12/2013  . Elevated cholesterol [E78.0] 04/11/2013  . 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 Elevated liver enzymes [R74.8] 11/23/2012  . Generalized anxiety disorder [F41.1] 08/23/2012  . Insomnia [G47.00] 08/14/2012  . Pain in joint, lower leg [M25.569] 08/14/2012  . HTN (hypertension)  [I10] 06/16/2012  . Migraine, unspecified, without mention of intractable migraine without mention of status migrainosus [G43.909] 06/16/2012   Total Time spent with patient: 20 minutes   Past Medical History:  Past Medical History  Diagnosis Date  . PONV (postoperative nausea and vomiting)     violent nausea/vomiting  . Hyperlipidemia     takes Atorvastatin daily  . Obesity   . Arthritis   . Anxiety     takes Citalopram daily  . Muscle spasm of back     takes Flexeril daily as needed  . Seasonal allergies     takes Claritin daily  . GERD (gastroesophageal reflux disease)     takes Omeprazole daily  . Hypertension     takes HCTZ daily as well as Propranolol  . Pneumonia     hx of-many,many yrs ago  . History of bronchitis     last yr  . Migraine     hx of  . Rheumatoid arthritis   . Joint pain   . Joint swelling   . Chronic back pain     radiculopathy  . Urinary urgency   . Diabetes mellitus without complication     "pre"  . Insomnia     takes trazodone nightly  . History of shingles     Past Surgical History  Procedure Laterality Date  . Back surgery    . Abdominal hysterectomy  2003  . Cholecystectomy N/A 06/27/2012    Procedure: LAPAROSCOPIC CHOLECYSTECTOMY WITH INTRAOPERATIVE CHOLANGIOGRAM;  Surgeon: Currie Paris, MD;  Location: [facility];  Service: General;  Laterality: N/A;  . Carpal tunnel release Bilateral   .  Neck surgery    . Colonoscopy    . Lumbar laminectomy/decompression microdiscectomy Right 04/23/2014    Procedure: Right L5-S1 Laminectomy/Foraminotomy;  Surgeon: Karn Cassis, MD;  Location: [facility];  Service: Neurosurgery;  Laterality: Right;  Right L5-S1 Laminectomy/Foraminotomy  . Lumbar wound debridement N/A 05/01/2014    Procedure: EXPLORATION OF LUMBAR WOUND;  Surgeon: Karn Cassis, MD;  Location: [facility];  Service: Neurosurgery;  Laterality: N/A;  EXPLORATION OF LUMBAR WOUND  . Lumbar wound debridement N/A 05/08/2014     Procedure: LUMBAR WOUND Exploration;  Surgeon: Karn Cassis, MD;  Location: [facility];  Service: Neurosurgery;  Laterality: N/A;   Family History:  Family History  Problem Relation Age of Onset  . Leukemia Father   . Cancer Father   . Diabetes Maternal Grandmother   . Heart disease Paternal Grandmother   . Colon cancer Paternal Aunt 63  . Rheum arthritis Mother   . Heart disease Mother     2 coronary stents  . Heart disease Maternal Uncle   . Heart disease Maternal Uncle    Social History:  History  Alcohol Use No     History  Drug Use No    History   Social History  . Marital Status: Single    Spouse Name: N/A  . Number of Children: N/A  . Years of Education: N/A   Social History Main Topics  . Smoking status: Former Smoker    Types: Cigarettes  . Smokeless tobacco: Never Used     Comment: quit smoking 40yrs ago  . Alcohol Use: No  . Drug Use: No  . Sexual Activity: Not on file   Other Topics Concern  . None   Social History Narrative   Additional History:    Sleep: Fair  Appetite:  Good   Assessment:   Musculoskeletal: Strength & Muscle Tone: within normal limits Gait & Station: normal Patient leans: N/A   Psychiatric Specialty Exam: Physical Exam  Review of Systems  Constitutional: Negative for fever and chills.  Respiratory: Negative for cough and shortness of breath.   Cardiovascular: Negative for chest pain.  Gastrointestinal: Negative for nausea and vomiting.  Musculoskeletal: Positive for myalgias and joint pain.  Skin: Negative for rash.  Neurological: Negative for seizures and headaches.    Blood pressure 122/77, pulse 69, temperature 98.2 F (36.8 C), temperature source Oral, resp. rate 20, height  (1.575 m), weight 214 lb (97.07 kg), SpO2 98 %.Body mass index is 39.13 kg/(m^2).  General Appearance: Well Groomed  Patent attorney::  Good  Speech:  Normal Rate  Volume:  Normal  Mood:  improved   Affect:  Appropriate and  reactive   Thought Process:  Goal Directed and Linear  Orientation:  Full (Time, Place, and Person)  Thought Content:  no hallucinations,no delusions   Suicidal Thoughts:  No- denies any thoughts of hurting self or anyone else   Homicidal Thoughts:  No  Memory:  recent and remote grossly intact   Judgement:  Other:  improved   Insight:  Present  Psychomotor Activity:  Normal  Concentration:  Good  Recall:  Good  Fund of Knowledge:Good  Language: Good  Akathisia:  Negative  Handed:  Right  AIMS (if indicated):     Assets:  Communication Skills Desire for Improvement Resilience Vocational/Educational  ADL's:  Improved   Cognition: WNL  Sleep:  Number of Hours: 5.5     Current Medications: Current Facility-Administered Medications  Medication Dose Route  Frequency Provider Last Rate Last Dose  . acetaminophen (TYLENOL) tablet 650 mg  650 mg Oral Q6H PRN Charm Rings, NP      . alum & mag hydroxide-simeth (MAALOX/MYLANTA) 200-200-20 MG/5ML suspension 30 mL  30 mL Oral Q4H PRN Charm Rings, NP      . aspirin EC tablet 81 mg  81 mg Oral Daily Charm Rings, NP   81 mg at 07/28/14 0810  . atorvastatin (LIPITOR) tablet 20 mg  20 mg Oral QPM Charm Rings, NP   20 mg at 07/27/14 1701  . cyclobenzaprine (FLEXERIL) tablet 10 mg  10 mg Oral TID PRN Charm Rings, NP      . DULoxetine (CYMBALTA) DR capsule 30 mg  30 mg Oral Daily Craige Cotta, MD   30 mg at 07/28/14 0811  . folic acid (FOLVITE) tablet 3 mg  3 mg Oral Daily Charm Rings, NP   3 mg at 07/28/14 0810  . gabapentin (NEURONTIN) capsule 300 mg  300 mg Oral BID Charm Rings, NP   300 mg at 07/28/14 0810  . hydrochlorothiazide (HYDRODIURIL) tablet 25 mg  25 mg Oral Daily Charm Rings, NP   25 mg at 07/28/14 1779  . loratadine (CLARITIN) tablet 10 mg  10 mg Oral Daily Charm Rings, NP   10 mg at 07/28/14 3903  . magnesium hydroxide (MILK OF MAGNESIA) suspension 30 mL  30 mL Oral Daily PRN Charm Rings, NP      .  naproxen (NAPROSYN) tablet 500 mg  500 mg Oral BID WC Charm Rings, NP   500 mg at 07/28/14 0810  . ondansetron (ZOFRAN-ODT) disintegrating tablet 4 mg  4 mg Oral Q8H PRN Charm Rings, NP      . pantoprazole (PROTONIX) EC tablet 40 mg  40 mg Oral BID AC Charm Rings, NP   40 mg at 07/28/14 0630  . potassium chloride SA (K-DUR,KLOR-CON) CR tablet 20 mEq  20 mEq Oral Daily Charm Rings, NP   20 mEq at 07/28/14 0092  . propranolol ER (INDERAL LA) 24 hr capsule 80 mg  80 mg Oral Daily Charm Rings, NP   80 mg at 07/28/14 0810  . traZODone (DESYREL) tablet 50 mg  50 mg Oral QHS PRN Charm Rings, NP   50 mg at 07/27/14 2223    Lab Results: No results found for this or any previous visit (from the past 48 hour(s)).  Physical Findings: AIMS: Facial and Oral Movements Muscles of Facial Expression: None, normal Lips and Perioral Area: None, normal Jaw: None, normal Tongue: None, normal,Extremity Movements Upper (arms, wrists, hands, fingers): None, normal Lower (legs, knees, ankles, toes): None, normal, Trunk Movements Neck, shoulders, hips: None, normal, Overall Severity Severity of abnormal movements (highest score from questions above): None, normal Incapacitation due to abnormal movements: None, normal Patient's awareness of abnormal movements (rate only patient's report): No Awareness, Dental Status Current problems with teeth and/or dentures?: No Does patient usually wear dentures?: No  CIWA:    COWS:      Assessment- at this time patient significantly improved compared to admission. Mood is better and presents euthymic. Reports feeling clearer and cognitively improved off opiates. Has not developed any opiate WDL symptoms. Tolerating medications ( Cymbalta, Neurontin) well and denies side effects. Currently focused on being discharged soon.   Treatment Plan Summary: Daily contact with patient to assess and evaluate symptoms and progress in treatment, Medication  management, Plan  continue inpatient treatment and continue medications as above  Cymbalta 30 mgrs QDAY  Neurontin 300 mgrs BID Will order repeat BMP to follow up on low K+  Medical Decision Making:  Established Problem, Stable/Improving (1), Review of Psycho-Social Stressors (1), Review or order clinical lab tests (1) and Review of Medication Regimen & Side Effects (2)     COBOS, FERNANDO 07/28/2014, 12:04 PM

## 2014-07-29 LAB — BASIC METABOLIC PANEL
Anion gap: 10 (ref 5–15)
BUN: 23 mg/dL (ref 6–23)
CALCIUM: 9 mg/dL (ref 8.4–10.5)
CO2: 29 mmol/L (ref 19–32)
Chloride: 102 mmol/L (ref 96–112)
Creatinine, Ser: 0.59 mg/dL (ref 0.50–1.10)
GFR calc Af Amer: >90 mL/min (ref >90)
Glucose, Bld: 123 mg/dL — ABNORMAL HIGH (ref 70–99)
POTASSIUM: 3.6 mmol/L (ref 3.5–5.1)
Sodium: 141 mmol/L (ref 135–145)

## 2014-07-29 MED ORDER — HYDROCHLOROTHIAZIDE 25 MG PO TABS: 25.0000 mg | ORAL_TABLET | Freq: Every day | ORAL | Status: DC

## 2014-07-29 MED ORDER — ATORVASTATIN CALCIUM 20 MG PO TABS: 20.0000 mg | ORAL_TABLET | Freq: Every evening | ORAL | Status: DC

## 2014-07-29 MED ORDER — OMEPRAZOLE 20 MG PO CPDR: 20.0000 mg | DELAYED_RELEASE_CAPSULE | Freq: Every day | ORAL | Status: DC

## 2014-07-29 MED ORDER — NAPROXEN 500 MG PO TABS: 500.0000 mg | ORAL_TABLET | Freq: Two times a day (BID) | ORAL | Status: DC

## 2014-07-29 MED ORDER — FOLIC ACID 1 MG PO TABS: 3.0000 mg | ORAL_TABLET | Freq: Every day | ORAL | Status: DC

## 2014-07-29 MED ORDER — LORATADINE 10 MG PO TABS
10.0000 mg | ORAL_TABLET | Freq: Every day | ORAL | Status: DC
Start: 2014-07-29 — End: 2014-10-28

## 2014-07-29 MED ORDER — CYCLOBENZAPRINE HCL 10 MG PO TABS: 10.0000 mg | ORAL_TABLET | Freq: Three times a day (TID) | ORAL | Status: DC | PRN

## 2014-07-29 MED ORDER — DULOXETINE HCL 30 MG PO CPEP: 30.0000 mg | ORAL_CAPSULE | Freq: Every day | ORAL | Status: DC

## 2014-07-29 MED ORDER — TRAZODONE HCL 50 MG PO TABS: 50.0000 mg | ORAL_TABLET | Freq: Every evening | ORAL | Status: DC | PRN

## 2014-07-29 MED ORDER — PROPRANOLOL HCL ER 80 MG PO CP24: 80.0000 mg | ORAL_CAPSULE | Freq: Every day | ORAL | Status: DC

## 2014-07-29 MED ORDER — ASPIRIN EC 81 MG PO TBEC: 81.0000 mg | DELAYED_RELEASE_TABLET | Freq: Every day | ORAL | Status: DC

## 2014-07-29 MED ORDER — GABAPENTIN 300 MG PO CAPS: 300.0000 mg | ORAL_CAPSULE | Freq: Two times a day (BID) | ORAL | Status: DC

## 2014-07-29 MED ORDER — ONDANSETRON 4 MG PO TBDP: ORAL_TABLET | ORAL | Status: DC

## 2014-07-29 MED ORDER — POTASSIUM BICARBONATE 25 MEQ PO TBEF: 25.0000 meq | EFFERVESCENT_TABLET | Freq: Every day | ORAL | Status: DC

## 2014-07-29 MED ORDER — ALBUTEROL SULFATE 108 (90 BASE) MCG/ACT IN AEPB: 1.0000 | INHALATION_SPRAY | Freq: Four times a day (QID) | RESPIRATORY_TRACT | Status: DC | PRN

## 2014-07-29 NOTE — Progress Notes (Signed)
  [facility] Adult Case Management Discharge Plan :  Will you be returning to the same living situation after discharge:  Yes,  patient plans to return home At discharge, do you have transportation home?: Yes,  patient reports access to transportation home Do you have the ability to pay for your medications: Yes,  patient will be provided with medication samples and prescriptions at discharge  Release of information consent forms completed and in the chart;  Patient's signature needed at discharge.  Patient to Follow up at: Follow-up Information    Follow up with [facility]. Go on 07/31/2014.   Why:  Therapy appointment at 3:30pm with Rudi Rummage. Please call office if you need to reschedule appointment.   Contact information:   [address] [place] Telephone: [phone]      Follow up with Patient agreeable to contact her primary care providers regarding medication management services. .      Patient denies SI/HI: Yes,  denies    Aeronautical engineer and Suicide Prevention discussed: Yes,  with patient and son   Have you used any form of tobacco in the last 30 days? (Cigarettes, Smokeless Tobacco, Cigars, and/or Pipes): No  Has patient been referred to the Quitline?: N/A patient is not a smoker  Iyani Dresner L 07/29/2014, 10:41 AM

## 2014-07-29 NOTE — Discharge Summary (Signed)
Physician Discharge Summary Note  Patient:  [name] is an 59 y.o., female MRN:  [id number] DOB:  02/09/1956 Patient phone:  [phone] (home)  Patient address:   [address] [place] [address],  Total Time spent with patient: Greater than 30 minutes  Date of Admission:  07/26/2014  Date of Discharge: 07-29-14  Reason for Admission: Worsening symptoms of depression  Principal Problem: <principal problem not specified> Discharge Diagnoses: Patient Active Problem List   Diagnosis Date Noted  . Major depressive disorder, single episode, severe without psychotic features [F32.2]   . Depression, major, single episode, severe [F32.2] 07/26/2014  . Suicidal ideations [R45.851] 07/26/2014  . 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 Fibromyalgia [M79.7] 02/19/2014  . Hypokalemia [E87.6] 09/18/2013  . Family history of ischemic heart disease [Z82.49]   . Chest pain at rest [R07.9] 09/17/2013  . Episode of dizziness after NTG while walking to BR [R42] 09/17/2013  . Chest pain [R07.9] 09/17/2013  . Inflammatory arthritis [M06.4] 09/12/2013  . Elevated cholesterol [E78.0] 04/11/2013  . 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 Elevated liver enzymes [R74.8] 11/23/2012  . Generalized anxiety disorder [F41.1] 08/23/2012  . Insomnia [G47.00] 08/14/2012  . Pain in joint, lower leg [M25.569] 08/14/2012  . HTN (hypertension) [I10] 06/16/2012  . Migraine, unspecified, without mention of intractable migraine without mention of status migrainosus [G43.909] 06/16/2012   Musculoskeletal: Strength & Muscle Tone: within normal limits Gait & Station: normal Patient leans: N/A  Psychiatric Specialty Exam: Physical Exam  Psychiatric: Her speech is normal and behavior is normal. Judgment and thought content normal. Her mood appears not anxious. Her affect is not  angry, not blunt, not labile and not inappropriate. Cognition and memory are normal. She does not exhibit a depressed mood.    Review of Systems  Constitutional: Negative.   HENT: Negative.   Eyes: Negative.   Respiratory: Negative.   Cardiovascular: Negative.   Gastrointestinal: Negative.   Genitourinary: Negative.   Musculoskeletal: Negative.   Skin: Negative.   Neurological: Negative.   Endo/Heme/Allergies: Negative.   Psychiatric/Behavioral: Positive for depression (Stable). Negative for suicidal ideas, hallucinations, memory loss and substance abuse. The patient has insomnia (Stable). The patient is not nervous/anxious.     Blood pressure 128/82, pulse 90, temperature 97.7 F (36.5 C), temperature source Oral, resp. rate 18, height $RemoveBe'5\' 2"'xFGMVeXuL$  (1.575 m), weight 97.07 kg (214 lb), SpO2 98 %.Body mass index is 39.13 kg/(m^2).  See Md's SRA   Past Medical History:  Past Medical History  Diagnosis Date  . PONV (postoperative nausea and vomiting)     violent nausea/vomiting  . Hyperlipidemia     takes Atorvastatin daily  . Obesity   . Arthritis   . Anxiety     takes Citalopram daily  . Muscle spasm of back     takes Flexeril daily as needed  . Seasonal allergies     takes Claritin daily  . GERD (gastroesophageal reflux disease)     takes Omeprazole daily  . Hypertension     takes HCTZ daily as well as Propranolol  . Pneumonia     hx of-many,many yrs ago  . History of bronchitis     last yr  . Migraine     hx of  . Rheumatoid arthritis   . Joint pain   . Joint swelling   . Chronic back pain  radiculopathy  . Urinary urgency   . Diabetes mellitus without complication     "pre"  . Insomnia     takes trazodone nightly  . History of shingles     Past Surgical History  Procedure Laterality Date  . Back surgery    . Abdominal hysterectomy  2003  . Cholecystectomy N/A 06/27/2012    Procedure: LAPAROSCOPIC CHOLECYSTECTOMY WITH INTRAOPERATIVE CHOLANGIOGRAM;  Surgeon:  Haywood Lasso, MD;  Location: [facility];  Service: General;  Laterality: N/A;  . Carpal tunnel release Bilateral   . Neck surgery    . Colonoscopy    . Lumbar laminectomy/decompression microdiscectomy Right 04/23/2014    Procedure: Right L5-S1 Laminectomy/Foraminotomy;  Surgeon: Floyce Stakes, MD;  Location: [facility];  Service: Neurosurgery;  Laterality: Right;  Right L5-S1 Laminectomy/Foraminotomy  . Lumbar wound debridement N/A 05/01/2014    Procedure: EXPLORATION OF LUMBAR WOUND;  Surgeon: Floyce Stakes, MD;  Location: [facility];  Service: Neurosurgery;  Laterality: N/A;  EXPLORATION OF LUMBAR WOUND  . Lumbar wound debridement N/A 05/08/2014    Procedure: LUMBAR WOUND Exploration;  Surgeon: Floyce Stakes, MD;  Location: [facility];  Service: Neurosurgery;  Laterality: N/A;   Family History:  Family History  Problem Relation Age of Onset  . Leukemia Father   . Cancer Father   . Diabetes Maternal Grandmother   . Heart disease Paternal Grandmother   . Colon cancer Paternal Aunt 89  . Rheum arthritis Mother   . Heart disease Mother     2 coronary stents  . Heart disease Maternal Uncle   . Heart disease Maternal Uncle    Social History:  History  Alcohol Use No     History  Drug Use No    History   Social History  . Marital Status: Single    Spouse Name: N/A  . Number of Children: N/A  . Years of Education: N/A   Social History Main Topics  . Smoking status: Former Smoker    Types: Cigarettes  . Smokeless tobacco: Never Used     Comment: quit smoking 29yrs ago  . Alcohol Use: No  . Drug Use: No  . Sexual Activity: Not on file   Other Topics Concern  . None   Social History Narrative   Risk to Self: Is patient at risk for suicide?: No What has been your use of drugs/alcohol within the last 12 months?: None Risk to Others: No  Prior Inpatient Therapy: No  Prior Outpatient Therapy: No  Level of Care:  OP  Hospital Course: [name] presented to  her PCP stating that she had been feeling suicidal over the previous weekend. She states she just doesn't want to get out of bed anymore and that she had worsening symptoms of depression over the previous several months. She states she is having a poor quality of life, and does not want to take a pill for everything.   [name] was admitted to the adult unit for Suicidal ideations related to worsening symptoms of depression. During her admission assessment, she was evaluated and her symptoms were identified. Medication management was discussed and initiated targeting her presenting symptoms. She was oriented to the unit and encouraged to participate in the unit programming. Her other pre-existing medical problems were identified and treated appropriately by resuming her pertinent home medication for those health issues.         During her hospital, [name] was evaluated each day by a clinical provider to ascertain  her response to her treatment regimen. As the day goes by, improvement was noted by the patient's report of decreasing symptoms, improved sleep, appetite, affect, medication tolerance, behavior, and participation in the unit programming.  She was required on daily basis to complete a self inventory asssessment noting mood, mental status, pain, new symptoms, anxiety and concerns. [name]'s symptoms responded well to her treatment regimen, being in a therapeutic and supportive environment also helped her mood stabilization. [name] did present appropriate behavior & was motivated for recovery. She worked closely with the treatment team and case manager to develop a discharge plan with appropriate goals to maintain mood stability after discharge. Coping skills, problem solving as well as relaxation therapies were also part of her unit programming.  On this day of discharge, [name] was in much improved condition than upon admission. Her symptoms were reported as significantly decreased or resolved completely.  Upon discharge, she denies SI/HI and voiced no AVH. She was motivated to continue taking medication with a goal of continued improvement in mental health. [name] was discharged home with a plan to follow up as noted below. She was medicated & discharged on; Duloxetine 30 mg daily for depression, Gabapentin 300 mg for agitation & Trazodone 50 mg daily for sleep.  Consults:  psychiatry  Significant Diagnostic Studies:  labs: CBC with diff, CMP, UDS, toxicology tests, U/A, results reviewed, stable  Discharge Vitals:   Blood pressure 128/82, pulse 90, temperature 97.7 F (36.5 C), temperature source Oral, resp. rate 18, height $RemoveBe'5\' 2"'elEUWnOlL$  (1.575 m), weight 97.07 kg (214 lb), SpO2 98 %. Body mass index is 39.13 kg/(m^2). Lab Results:   Results for orders placed or performed during the hospital encounter of 07/26/14 (from the past 72 hour(s))  Basic metabolic panel     Status: Abnormal   Collection Time: 07/29/14  6:37 AM  Result Value Ref Range   Sodium 141 135 - 145 mmol/L   Potassium 3.6 3.5 - 5.1 mmol/L   Chloride 102 96 - 112 mmol/L   CO2 29 19 - 32 mmol/L   Glucose, Bld 123 (H) 70 - 99 mg/dL   BUN 23 6 - 23 mg/dL   Creatinine, Ser 0.59 0.50 - 1.10 mg/dL   Calcium 9.0 8.4 - 10.5 mg/dL   GFR calc non Af Amer >90 >90 mL/min   GFR calc Af Amer >90 >90 mL/min    Comment: (NOTE) The eGFR has been calculated using the CKD EPI equation. This calculation has not been validated in all clinical situations. eGFR's persistently <90 mL/min signify possible Chronic Kidney Disease.    Anion gap 10 5 - 15    Comment: Performed at [facility]   Physical Findings: AIMS: Facial and Oral Movements Muscles of Facial Expression: None, normal Lips and Perioral Area: None, normal Jaw: None, normal Tongue: None, normal,Extremity Movements Upper (arms, wrists, hands, fingers): None, normal Lower (legs, knees, ankles, toes): None, normal, Trunk Movements Neck, shoulders, hips: None, normal,  Overall Severity Severity of abnormal movements (highest score from questions above): None, normal Incapacitation due to abnormal movements: None, normal Patient's awareness of abnormal movements (rate only patient's report): No Awareness, Dental Status Current problems with teeth and/or dentures?: No Does patient usually wear dentures?: No  CIWA:    COWS:     See Psychiatric Specialty Exam and Suicide Risk Assessment completed by Attending Physician prior to discharge.  Discharge destination:  Home  Is patient on multiple antipsychotic therapies at discharge:  No   Has Patient  had three or more failed trials of antipsychotic monotherapy by history:  No  Recommended Plan for Multiple Antipsychotic Therapies: NA    Medication List    STOP taking these medications        citalopram 20 MG tablet  Commonly known as:  CELEXA     Methotrexate Sodium (PF) 50 MG/2ML Soln     Naproxen Sodium 220 MG Caps     oxyCODONE-acetaminophen 5-325 MG per tablet  Commonly known as:  PERCOCET     traMADol 50 MG tablet  Commonly known as:  ULTRAM      TAKE these medications      Indication   Albuterol Sulfate 108 (90 BASE) MCG/ACT Aepb  Commonly known as:  PROAIR RESPICLICK  Inhale 1 puff into the lungs every 6 (six) hours as needed (shortness of breath, wheeze).   Indication:  Wheezing     aspirin EC 81 MG tablet  Take 1 tablet (81 mg total) by mouth daily. For health heallth   Indication:  Heart helath     atorvastatin 20 MG tablet  Commonly known as:  LIPITOR  Take 1 tablet (20 mg total) by mouth every evening. For High cholesterol/fat   Indication:  Inherited Homozygous Hypercholesterolemia, Increased Fats, Triglycerides & Cholesterol in the Blood     cyclobenzaprine 10 MG tablet  Commonly known as:  FLEXERIL  Take 1 tablet (10 mg total) by mouth 3 (three) times daily as needed for muscle spasms.   Indication:  Muscle Spasm     DULoxetine 30 MG capsule  Commonly known as:   CYMBALTA  Take 1 capsule (30 mg total) by mouth daily. For depression   Indication:  Major Depressive Disorder     folic acid 1 MG tablet  Commonly known as:  FOLVITE  Take 3 tablets (3 mg total) by mouth daily. For low Folate   Indication:  Deficiency of Folic Acid in the Diet     gabapentin 300 MG capsule  Commonly known as:  NEURONTIN  Take 1 capsule (300 mg total) by mouth 2 (two) times daily. For agitation   Indication:  Agitation     hydrochlorothiazide 25 MG tablet  Commonly known as:  HYDRODIURIL  Take 1 tablet (25 mg total) by mouth daily. For high blood pressure   Indication:  High Blood Pressure     loratadine 10 MG tablet  Commonly known as:  CLARITIN  Take 1 tablet (10 mg total) by mouth daily. For allergies   Indication:  Perennial Rhinitis, Hayfever     naproxen 500 MG tablet  Commonly known as:  NAPROSYN  Take 1 tablet (500 mg total) by mouth 2 (two) times daily with a meal. For moderate pain   Indication:  Mild to Moderate Pain     omeprazole 20 MG capsule  Commonly known as:  PRILOSEC  Take 1 capsule (20 mg total) by mouth daily. For acid reflux   Indication:  Gastroesophageal Reflux Disease     ondansetron 4 MG disintegrating tablet  Commonly known as:  ZOFRAN-ODT  DISSOLVE 1 TABLET ON TONGUE EVERY 8 HOURS AS NEEDED FOR NAUSEA OR VOMITING.   Indication:  Nausea/vomiting     potassium bicarbonate 25 MEQ disintegrating tablet  Commonly known as:  KLOR-CON/EF  Take 1 tablet (25 mEq total) by mouth at bedtime. (DISSOLVE IN WATER): For low potassium   Indication:  Low Amount of Potassium in the Blood     propranolol ER 80 MG 24 hr capsule  Commonly known as:  INDERAL LA  Take 1 capsule (80 mg total) by mouth daily. For high blood pressure   Indication:  High Blood Pressure     traZODone 50 MG tablet  Commonly known as:  DESYREL  Take 1 tablet (50 mg total) by mouth at bedtime as needed for sleep.   Indication:  Trouble Sleeping       Follow-up  Information    Follow up with [facility]. Go on 07/31/2014.   Why:  Therapy appointment at 3:30pm with Verlin Grills. Please call office if you need to reschedule appointment.   Contact information:   [address] [place] Telephone: [phone]      Follow up with Patient agreeable to contact her primary care providers regarding medication management services. .     Follow-up recommendations: Activity:  As tolerated Diet: As recommended by your primary care doctor. Keep all scheduled follow-up appointments as recommended.    Comments: Take all your medications as prescribed by your mental healthcare provider. Report any adverse effects and or reactions from your medicines to your outpatient provider promptly. Patient is instructed and cautioned to not engage in alcohol and or illegal drug use while on prescription medicines. In the event of worsening symptoms, patient is instructed to call the crisis hotline, 911 and or go to the nearest ED for appropriate evaluation and treatment of symptoms. Follow-up with your primary care provider for your other medical issues, concerns and or health care needs.   Total Discharge Time: Greater than 30 minutes  Signed: Encarnacion Slates, PMHNP, FNP-BC 07/29/2014, 11:06 AM    Patient seen, Suicide Assessment Completed.  Disposition Plan Reviewed

## 2014-07-29 NOTE — [facility] Group Notes (Signed)
   [facility] LCSW Aftercare Discharge Planning Group Note  07/29/2014  8:45 AM   Participation Quality: Alert, Appropriate and Oriented  Mood/Affect: Appropriate  Depression Rating: 0  Anxiety Rating: 0  Thoughts of Suicide: Pt denies SI/HI  Will you contract for safety? Yes  Current AVH: Pt denies  Plan for Discharge/Comments: Pt attended discharge planning group and actively participated in group. CSW provided pt with today's workbook. Patient reports feeling "great" today and reports feeling ready for discharge. Patient plans to follow up with [facility] for therapy and her PCP for medication management services.  Transportation Means: Pt reports access to transportation  Supports: No supports mentioned at this time  Samuella Bruin, MSW, [employer] Clinical Social Worker [employer] [phone]

## 2014-07-29 NOTE — Progress Notes (Signed)
Recreation Therapy Notes  Date: 03.28.2016 Time: 9:30am Location: [address] Group Room   Group Topic: Stress Management  Goal Area(s) Addresses:  Patient will actively participate in stress management techniques presented during session.   Behavioral Response: Did not attend.   Marykay Lex Alexyia Guarino, LRT/CTRS  Markanthony Gedney L 07/29/2014 10:18 AM

## 2014-07-29 NOTE — [facility] Suicide Risk Assessment (Addendum)
[facility] Discharge Suicide Risk Assessment   Demographic Factors:  59 year old female, employed, divorced, has two adult sons   Total Time spent with patient: 30 minutes  Musculoskeletal: Strength & Muscle Tone: within normal limits Gait & Station: normal Patient leans: N/A  Psychiatric Specialty Exam: Physical Exam  ROS  Blood pressure 128/82, pulse 90, temperature 97.7 F (36.5 C), temperature source Oral, resp. rate 18, height 5\' 2"  (1.575 m), weight 214 lb (97.07 kg), SpO2 98 %.Body mass index is 39.13 kg/(m^2).  General Appearance: Well Groomed  Eye Contact::  Good  Speech:  Normal Rate409  Volume:  Normal  Mood:  improved, euthymic   Affect:  Appropriate and Congruent  Thought Process:  Goal Directed and Linear  Orientation:  Full (Time, Place, and Person)  Thought Content:  no hallucinations, no delusions , not internally preoccupied   Suicidal Thoughts:  No- denies any thoughts of hurting self or anyone else   Homicidal Thoughts:  No  Memory:  Recent and Remote grossly intact- states she feels clearer, with better memory since she stopped opiate medications  Judgement:  Good  Insight:  Present  Psychomotor Activity:  Normal  Concentration:  Good  Recall:  Good  Fund of Knowledge:Good  Language: Good  Akathisia:  Negative  Handed:  Right  AIMS (if indicated):     Assets:  Communication Skills Desire for Improvement Resilience Social Support Vocational/Educational  Sleep:  Number of Hours: 5.5  Cognition: WNL  ADL's:  Improved    Have you used any form of tobacco in the last 30 days? (Cigarettes, Smokeless Tobacco, Cigars, and/or Pipes): No  Has this patient used any form of tobacco in the last 30 days? (Cigarettes, Smokeless Tobacco, Cigars, and/or Pipes) No  Mental Status Per Nursing Assessment::   On Admission:     Current Mental Status by Physician: At this time patient is much improved compared to admission, with a normal mood, full range of affect, no  thought disorder, no SI or HI , no hallucinations, no delusions, future oriented, feeling more optimistic and in better control. She has not developed any symptoms of opiate withdrawal, and her pain has been in control and manageable with current medication regimen. She has tolerated Cymbalta trial well thus far.  Loss Factors: Chronic pain, three recent back surgeries   Historical Factors: No prior psychiatric admissions, no history of suicide attempts, history of depression related to chronic pain.  Risk Reduction Factors:   Sense of responsibility to family, Employed, Positive social support and Positive coping skills or problem solving skills  Continued Clinical Symptoms:  As noted, much improved compared to admission- see above   Cognitive Features That Contribute To Risk:  No gross cognitive deficits noted upon discharge. Is alert , attentive, and oriented x 3    Suicide Risk:  Minimal: No identifiable suicidal ideation.  Patients presenting with no risk factors but with morbid ruminations; may be classified as minimal risk based on the severity of the depressive symptoms  Principal Problem: <principal problem not specified> Discharge Diagnoses:  Patient Active Problem List   Diagnosis Date Noted  . Major depressive disorder, single episode, severe without psychotic features [F32.2]   . Depression, major, single episode, severe [F32.2] 07/26/2014  . Suicidal ideations [R45.851] 07/26/2014  . 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 Fibromyalgia [M79.7] 02/19/2014  . Hypokalemia [E87.6] 09/18/2013  . Family history of ischemic  heart disease [Z82.49]   . Chest pain at rest [R07.9] 09/17/2013  . Episode of dizziness after NTG while walking to BR [R42] 09/17/2013  . Chest pain [R07.9] 09/17/2013  . Inflammatory arthritis [M06.4] 09/12/2013  . Elevated cholesterol [E78.0] 04/11/2013   . 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 Elevated liver enzymes [R74.8] 11/23/2012  . Generalized anxiety disorder [F41.1] 08/23/2012  . Insomnia [G47.00] 08/14/2012  . Pain in joint, lower leg [M25.569] 08/14/2012  . HTN (hypertension) [I10] 06/16/2012  . Migraine, unspecified, without mention of intractable migraine without mention of status migrainosus [G43.909] 06/16/2012    Follow-up Information    Follow up with [facility]. Go on 07/31/2014.   Why:  Therapy appointment at 3:30pm with Rudi Rummage. Please call office if you need to reschedule appointment.   Contact information:   [address] [place] Telephone: [phone]      Follow up with Patient agreeable to contact her primary care providers regarding medication management services. .      Plan Of Care/Follow-up recommendations:  Activity:  As tolerated Diet:  low sodium Tests:  NA Other:  see below  Is patient on multiple antipsychotic therapies at discharge:  No   Has Patient had three or more failed trials of antipsychotic monotherapy by history:  No  Recommended Plan for Multiple Antipsychotic Therapies: NA   Patient is going home in good spirits. Returning home . Follow up as above. Has established PCP- Dr. Deborha Payment for medication management .  She also has a Publishing rights manager for management of her RA. Encouraged to follow up with a pain clinic for ongoing management of her chronic pain.    COBOS, FERNANDO 07/29/2014, 11:15 AM

## 2014-07-29 NOTE — Progress Notes (Signed)
Patient ID: [name], female   DOB: [birth year], 59 y.o.   MRN: [id number] Patient discharged per physician order; patient denies SI/HI and A/V hallucinations; patient received samples, prescriptions, and copy of AVS after it was reviewed; patient had no other questions or concerns at this time; patient verbalized and signed that they had all belongings returned; patient left the unit ambulatory

## 2014-07-29 NOTE — [facility] Suicide Risk Assessment (Signed)
[facility] INPATIENT:  Family/Significant Other Suicide Prevention Education  Suicide Prevention Education:  Education Completed; son [place] Winkle [phone],  (name of family member/significant other) has been identified by the patient as the family member/significant other with whom the patient will be residing, and identified as the person(s) who will aid the patient in the event of a mental health crisis (suicidal ideations/suicide attempt).  With written consent from the patient, the family member/significant other has been provided the following suicide prevention education, prior to the and/or following the discharge of the patient.  The suicide prevention education provided includes the following:  Suicide risk factors  Suicide prevention and interventions  National Suicide Hotline telephone number  [facility] assessment telephone number  [facility] Emergency Assistance 911  [facility] and/or Residential Mobile Crisis Unit telephone number  Request made of family/significant other to:  Remove weapons (e.g., guns, rifles, knives), all items previously/currently identified as safety concern.    Remove drugs/medications (over-the-counter, prescriptions, illicit drugs), all items previously/currently identified as a safety concern.  The family member/significant other verbalizes understanding of the suicide prevention education information provided.  The family member/significant other agrees to remove the items of safety concern listed above.  Fitz Matsuo, [place] 07/29/2014, 10:40 AM

## 2014-07-31 NOTE — ED Provider Notes (Signed)
CSN: [id number]     Arrival date & time 07/25/14  1657 History   First MD Initiated Contact with Patient 07/25/14 1758     Chief Complaint  Patient presents with  . Medical Clearance     (Consider location/radiation/quality/duration/timing/severity/associated sxs/prior Treatment) HPI   59yF with depression. Back surgery in past year complicated with need for repeat operations. Increasingly depressed since then. Feels overwhelmed at times. Deals with chronic pain. Doesn't like idea of being on so many medications an often times feels like they aren't. At times wishes she was dead, but has no specific plan. No HI. No hallucinations. Denies substance abuse. Works as a IT consultant. Recent stress between her sister as pt feels she is taking advantage of their mother by having mother move from her home into a rental property of her sister's and making mother pay rent.   Past Medical History  Diagnosis Date  . PONV (postoperative nausea and vomiting)     violent nausea/vomiting  . Hyperlipidemia     takes Atorvastatin daily  . Obesity   . Arthritis   . Anxiety     takes Citalopram daily  . Muscle spasm of back     takes Flexeril daily as needed  . Seasonal allergies     takes Claritin daily  . GERD (gastroesophageal reflux disease)     takes Omeprazole daily  . Hypertension     takes HCTZ daily as well as Propranolol  . Pneumonia     hx of-many,many yrs ago  . History of bronchitis     last yr  . Migraine     hx of  . Rheumatoid arthritis   . Joint pain   . Joint swelling   . Chronic back pain     radiculopathy  . Urinary urgency   . Diabetes mellitus without complication     "pre"  . Insomnia     takes trazodone nightly  . History of shingles    Past Surgical History  Procedure Laterality Date  . Back surgery    . Abdominal hysterectomy  2003  . Cholecystectomy N/A 06/27/2012    Procedure: LAPAROSCOPIC CHOLECYSTECTOMY WITH INTRAOPERATIVE CHOLANGIOGRAM;  Surgeon:  Currie Paris, MD;  Location: [facility];  Service: General;  Laterality: N/A;  . Carpal tunnel release Bilateral   . Neck surgery    . Colonoscopy    . Lumbar laminectomy/decompression microdiscectomy Right 04/23/2014    Procedure: Right L5-S1 Laminectomy/Foraminotomy;  Surgeon: Karn Cassis, MD;  Location: [facility];  Service: Neurosurgery;  Laterality: Right;  Right L5-S1 Laminectomy/Foraminotomy  . Lumbar wound debridement N/A 05/01/2014    Procedure: EXPLORATION OF LUMBAR WOUND;  Surgeon: Karn Cassis, MD;  Location: [facility];  Service: Neurosurgery;  Laterality: N/A;  EXPLORATION OF LUMBAR WOUND  . Lumbar wound debridement N/A 05/08/2014    Procedure: LUMBAR WOUND Exploration;  Surgeon: Karn Cassis, MD;  Location: [facility];  Service: Neurosurgery;  Laterality: N/A;   Family History  Problem Relation Age of Onset  . Leukemia Father   . Cancer Father   . Diabetes Maternal Grandmother   . Heart disease Paternal Grandmother   . Colon cancer Paternal Aunt 34  . Rheum arthritis Mother   . Heart disease Mother     2 coronary stents  . Heart disease Maternal Uncle   . Heart disease Maternal Uncle    History  Substance Use Topics  . Smoking status: Former Smoker    Types: Cigarettes  .  Smokeless tobacco: Never Used     Comment: quit smoking 21yrs ago  . Alcohol Use: No   OB History    No data available     Review of Systems  All systems reviewed and negative, other than as noted in HPI.   Allergies  Hydrocodone  Home Medications   Prior to Admission medications   Medication Sig Start Date End Date Taking? Authorizing Provider  Albuterol Sulfate (PROAIR RESPICLICK) 108 (90 BASE) MCG/ACT AEPB Inhale 1 puff into the lungs every 6 (six) hours as needed (shortness of breath, wheeze). 07/29/14   Sanjuana Kava, NP  aspirin EC 81 MG tablet Take 1 tablet (81 mg total) by mouth daily. For health heallth 07/29/14   Sanjuana Kava, NP  atorvastatin (LIPITOR) 20  MG tablet Take 1 tablet (20 mg total) by mouth every evening. For High cholesterol/fat 07/29/14   Sanjuana Kava, NP  cyclobenzaprine (FLEXERIL) 10 MG tablet Take 1 tablet (10 mg total) by mouth 3 (three) times daily as needed for muscle spasms. 07/29/14   Sanjuana Kava, NP  DULoxetine (CYMBALTA) 30 MG capsule Take 1 capsule (30 mg total) by mouth daily. For depression 07/29/14   Sanjuana Kava, NP  folic acid (FOLVITE) 1 MG tablet Take 3 tablets (3 mg total) by mouth daily. For low Folate 07/29/14   Sanjuana Kava, NP  gabapentin (NEURONTIN) 300 MG capsule Take 1 capsule (300 mg total) by mouth 2 (two) times daily. For agitation 07/29/14   Sanjuana Kava, NP  hydrochlorothiazide (HYDRODIURIL) 25 MG tablet Take 1 tablet (25 mg total) by mouth daily. For high blood pressure 07/29/14   Sanjuana Kava, NP  loratadine (CLARITIN) 10 MG tablet Take 1 tablet (10 mg total) by mouth daily. For allergies 07/29/14   Sanjuana Kava, NP  naproxen (NAPROSYN) 500 MG tablet Take 1 tablet (500 mg total) by mouth 2 (two) times daily with a meal. For moderate pain 07/29/14   Sanjuana Kava, NP  omeprazole (PRILOSEC) 20 MG capsule Take 1 capsule (20 mg total) by mouth daily. For acid reflux 07/29/14   Sanjuana Kava, NP  ondansetron (ZOFRAN-ODT) 4 MG disintegrating tablet DISSOLVE 1 TABLET ON TONGUE EVERY 8 HOURS AS NEEDED FOR NAUSEA OR VOMITING. 07/29/14   Sanjuana Kava, NP  potassium bicarbonate (KLOR-CON/EF) 25 MEQ disintegrating tablet Take 1 tablet (25 mEq total) by mouth at bedtime. (DISSOLVE IN WATER): For low potassium 07/29/14   Sanjuana Kava, NP  propranolol ER (INDERAL LA) 80 MG 24 hr capsule Take 1 capsule (80 mg total) by mouth daily. For high blood pressure 07/29/14   Sanjuana Kava, NP  traZODone (DESYREL) 50 MG tablet Take 1 tablet (50 mg total) by mouth at bedtime as needed for sleep. 07/29/14   Sanjuana Kava, NP   BP 151/79 mmHg  Pulse 63  Temp(Src) 98.4 F (36.9 C) (Oral)  Resp 18  SpO2 97% Physical Exam   Constitutional: She is oriented to person, place, and time. She appears well-developed and well-nourished. No distress.  HENT:  Head: Normocephalic and atraumatic.  Eyes: Conjunctivae are normal. Right eye exhibits no discharge. Left eye exhibits no discharge.  Neck: Neck supple.  Cardiovascular: Normal rate, regular rhythm and normal heart sounds.  Exam reveals no gallop and no friction rub.   No murmur heard. Pulmonary/Chest: Effort normal and breath sounds normal. No respiratory distress.  Abdominal: Soft. She exhibits no distension. There is no tenderness.  Musculoskeletal:  She exhibits no edema or tenderness.  Neurological: She is alert and oriented to person, place, and time. No cranial nerve deficit. She exhibits normal muscle tone. Coordination normal.  Skin: Skin is warm and dry.  Psychiatric: She has a normal mood and affect. Her behavior is normal. Thought content normal.  Speech clear. Content appropriate. Calm. Cooperative.   Nursing note and vitals reviewed.   ED Course  Procedures (including critical care time) Labs Review Labs Reviewed  ACETAMINOPHEN LEVEL - Abnormal; Notable for the following:    Acetaminophen (Tylenol), Serum <10.0 (*)    All other components within normal limits  CBC - Abnormal; Notable for the following:    Hemoglobin 11.8 (*)    All other components within normal limits  COMPREHENSIVE METABOLIC PANEL - Abnormal; Notable for the following:    Potassium 3.3 (*)    ALT 39 (*)    All other components within normal limits  URINE RAPID DRUG SCREEN (HOSP PERFORMED) - Abnormal; Notable for the following:    Benzodiazepines POSITIVE (*)    All other components within normal limits  ETHANOL  SALICYLATE LEVEL    Imaging Review No results found.   EKG Interpretation None      MDM   Final diagnoses:  Major depressive disorder, single episode, severe without psychotic features  Suicidal ideations  Generalized anxiety disorder    59yF  with depression and feelings of hopelessness. TTS eval.    Raeford Razor, MD 07/31/14 [phone]

## 2014-07-31 NOTE — Progress Notes (Signed)
Patient Discharge Instructions:  After Visit Summary (AVS):   Faxed to:  07/31/14 Discharge Summary Note:   Faxed to:  07/31/14 Psychiatric Admission Assessment Note:   Faxed to:  07/31/14 Suicide Risk Assessment - Discharge Assessment:   Faxed to:  07/31/14 Faxed/Sent to the Next Level Care provider:  07/31/14 Faxed to [facility] - Dr. Jasmine December Burkitt @ [phone]  Jerelene Redden, 07/31/2014, 2:31 PM

## 2014-08-14 ENCOUNTER — Other Ambulatory Visit: Payer: Self-pay | Admitting: Emergency Medicine

## 2014-08-20 ENCOUNTER — Ambulatory Visit (INDEPENDENT_AMBULATORY_CARE_PROVIDER_SITE_OTHER): Payer: BLUE CROSS/BLUE SHIELD | Admitting: Family Medicine

## 2014-08-20 VITALS — BP 124/70 | HR 57 | Temp 98.0°F | Resp 16 | Ht 62.0 in | Wt 211.0 lb

## 2014-08-20 DIAGNOSIS — K219 Gastro-esophageal reflux disease without esophagitis: Secondary | ICD-10-CM

## 2014-08-20 DIAGNOSIS — E7800 Pure hypercholesterolemia, unspecified: Secondary | ICD-10-CM

## 2014-08-20 DIAGNOSIS — G4700 Insomnia, unspecified: Secondary | ICD-10-CM | POA: Diagnosis not present

## 2014-08-20 DIAGNOSIS — M797 Fibromyalgia: Secondary | ICD-10-CM | POA: Diagnosis not present

## 2014-08-20 LAB — LIPID PANEL
Cholesterol: 134 mg/dL (ref 0–200)
HDL: 51 mg/dL (ref >=46)
LDL Cholesterol: 63 mg/dL (ref 0–99)
Total CHOL/HDL Ratio: 2.6 Ratio
Triglycerides: 102 mg/dL (ref <150)
VLDL: 20 mg/dL (ref 0–40)

## 2014-08-20 MED ORDER — OMEPRAZOLE 20 MG PO CPDR: 20.0000 mg | DELAYED_RELEASE_CAPSULE | Freq: Every day | ORAL | Status: DC

## 2014-08-20 MED ORDER — DULOXETINE HCL 30 MG PO CPEP: 30.0000 mg | ORAL_CAPSULE | Freq: Two times a day (BID) | ORAL | Status: DC

## 2014-08-20 MED ORDER — TRAZODONE HCL 50 MG PO TABS: 100.0000 mg | ORAL_TABLET | Freq: Every evening | ORAL | Status: DC | PRN

## 2014-08-20 MED ORDER — ATORVASTATIN CALCIUM 20 MG PO TABS: 20.0000 mg | ORAL_TABLET | Freq: Every evening | ORAL | Status: DC

## 2014-08-20 NOTE — Progress Notes (Signed)
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 She has been suffering from a series of setbacks including back surgery, CSF leak, fibromyalgia flare, and rheumatoid arthritis.  Following the admission to behavioral health for 5 days, she recovered from her feelings of severe depression (without suicidal ideation). She attributes some of this improvement to her ongoing counseling, group therapy, and especially to her Cymbalta. She would like to stop the Celexa since the Cymbalta is also an SSRI. We talked about the fact that this would probably be fine and unlikely to cause withdrawal symptoms. I described the withdrawal symptoms if they should occur and how to restart the Celexa and taper it.  Patient says that her rheumatoid arthritis is well controlled. She sees Dr. Dareen Piano for this although he is planning on leaving his practice in the near future. She continues to take the methotrexate and uses Zofran almost a she's initiates the methotrexate to prevent nausea. She is having minimal swelling or discomfort in her fingers and the fibromyalgia seems to be in remission at this point.  She is also taking Lipitor for her cholesterol elevation. This has not been checked in a year and she would like this rechecked. She's had periodic liver function test, most recent of which are normal.  Objective:BP 124/70 mmHg  Pulse 57  Temp(Src) 98 F (36.7 C) (Oral)  Resp 16  Ht 5\' 2"  (1.575 m)  Wt 211 lb (95.709 kg)  BMI 38.58 kg/m2  SpO2 96% Patient has minimal swelling of her PIP joints in both hands. She has good range of motion forearms, fingers, elbows, wrists No edema No rash Respirations normal  Assessment: Patient has recovered from her severe major depression and is now also doing well with respect to her fibromyalgia and rheumatoid arthritis. She would like to increase her Cymbalta and stop the Celexa  which I am arranging.  This chart was scribed in my presence and reviewed by me personally.    ICD-9-CM ICD-10-CM   1. Fibromyalgia 729.1 M79.7 DULoxetine (CYMBALTA) 30 MG capsule  2. Insomnia 780.52 G47.00 traZODone (DESYREL) 50 MG tablet  3. High cholesterol 272.0 E78.0 atorvastatin (LIPITOR) 20 MG tablet     Lipid panel  4. Gastroesophageal reflux disease without esophagitis 530.81 K21.9 omeprazole (PRILOSEC) 20 MG capsule   Patient will be continuing her ongoing counseling and group therapy as she increases the Cymbalta to 30 mg twice a day and stops his Celexa.  Were checking the cholesterol today.  Signed, , MD

## 2014-09-04 ENCOUNTER — Other Ambulatory Visit: Payer: Self-pay | Admitting: Family Medicine

## 2014-09-09 ENCOUNTER — Other Ambulatory Visit: Payer: Self-pay | Admitting: Family Medicine

## 2014-09-09 DIAGNOSIS — I10 Essential (primary) hypertension: Secondary | ICD-10-CM

## 2014-09-09 NOTE — Telephone Encounter (Signed)
Pt called to check Rf of propanolol. We had denied Rx because RFs should be on file at pharm from another Dr Isidore Moos. I called pharm who reported they do not have the Rx from the other provider. Sent in RFs, pt aware.

## 2014-10-05 ENCOUNTER — Ambulatory Visit (INDEPENDENT_AMBULATORY_CARE_PROVIDER_SITE_OTHER): Payer: BLUE CROSS/BLUE SHIELD | Admitting: Emergency Medicine

## 2014-10-05 VITALS — BP 118/70 | HR 40 | Temp 98.3°F | Resp 18

## 2014-10-05 DIAGNOSIS — S61209A Unspecified open wound of unspecified finger without damage to nail, initial encounter: Secondary | ICD-10-CM

## 2014-10-05 DIAGNOSIS — M79674 Pain in right toe(s): Secondary | ICD-10-CM

## 2014-10-05 DIAGNOSIS — IMO0002 Reserved for concepts with insufficient information to code with codable children: Secondary | ICD-10-CM

## 2014-10-05 DIAGNOSIS — Z23 Encounter for immunization: Secondary | ICD-10-CM | POA: Diagnosis not present

## 2014-10-05 MED ORDER — HYDROMORPHONE HCL 2 MG PO TABS: ORAL_TABLET | ORAL | Status: DC

## 2014-10-05 MED ORDER — CEPHALEXIN 500 MG PO CAPS: 500.0000 mg | ORAL_CAPSULE | Freq: Four times a day (QID) | ORAL | Status: DC

## 2014-10-05 NOTE — Patient Instructions (Signed)

## 2014-10-05 NOTE — Progress Notes (Signed)
Subjective:  Patient ID: [name], female    DOB: April 17, 1956  Age: 59 y.o. MRN: [id number]  CC: Nail Problem   HPI [name] presents  an injury to her right foot. She was pulling a piece of equipment from her she had. The item struck her in the foot and avulsed her great toenail on the right foot. She is not current on tetanus. She has marked pain in her foot. She is unable to bear weight.  She has no improvement of pain with over-the-counter medication. He has numerous pain medication allergies.  Outpatient Prescriptions Prior to Visit  Medication Sig Dispense Refill  . aspirin EC 81 MG tablet Take 1 tablet (81 mg total) by mouth daily. For health heallth    . atorvastatin (LIPITOR) 20 MG tablet Take 1 tablet (20 mg total) by mouth every evening. For High cholesterol/fat 90 tablet 3  . cyclobenzaprine (FLEXERIL) 10 MG tablet Take 1 tablet (10 mg total) by mouth 3 (three) times daily as needed for muscle spasms. 30 tablet 0  . DULoxetine (CYMBALTA) 30 MG capsule Take 1 capsule (30 mg total) by mouth 2 (two) times daily. For depression 60 capsule 11  . folic acid (FOLVITE) 1 MG tablet Take 3 tablets (3 mg total) by mouth daily. For low Folate  1  . gabapentin (NEURONTIN) 300 MG capsule Take 1 capsule (300 mg total) by mouth 2 (two) times daily. For agitation 60 capsule 0  . hydrochlorothiazide (HYDRODIURIL) 25 MG tablet Take 1 tablet (25 mg total) by mouth daily. For high blood pressure 90 tablet 3  . loratadine (CLARITIN) 10 MG tablet Take 1 tablet (10 mg total) by mouth daily. For allergies 90 tablet 3  . Methotrexate, PF, 10 MG/0.2ML SOAJ Inject every friday    . naproxen (NAPROSYN) 500 MG tablet Take 1 tablet (500 mg total) by mouth 2 (two) times daily with a meal. For moderate pain    . omeprazole (PRILOSEC) 20 MG capsule Take 1 capsule (20 mg total) by mouth daily. PATIENT NEEDS MED REFILL VISIT FOR ADDITIONAL REFILLS 30 capsule 11  . ondansetron (ZOFRAN-ODT) 4 MG disintegrating  tablet DISSOLVE 1 TABLET ON TONGUE EVERY 8 HOURS AS NEEDED FOR NAUSEA OR VOMITING. 30 tablet 4  . potassium bicarbonate (KLOR-CON/EF) 25 MEQ disintegrating tablet Take 1 tablet (25 mEq total) by mouth at bedtime. (DISSOLVE IN WATER): For low potassium 30 tablet 3  . propranolol (INDERAL) 80 MG tablet TAKE 1 TABLET BY MOUTH DAILY. 90 tablet 1  . propranolol ER (INDERAL LA) 80 MG 24 hr capsule Take 1 capsule (80 mg total) by mouth daily. For high blood pressure 30 capsule 6  . traZODone (DESYREL) 50 MG tablet Take 2 tablets (100 mg total) by mouth at bedtime as needed for sleep. 60 tablet 11   No facility-administered medications prior to visit.    History   Social History  . Marital Status: Single    Spouse Name: N/A  . Number of Children: N/A  . Years of Education: N/A   Social History Main Topics  . Smoking status: Former Smoker    Types: Cigarettes  . Smokeless tobacco: Never Used     Comment: quit smoking 24yrs ago  . Alcohol Use: No  . Drug Use: No  . Sexual Activity: Not on file   Other Topics Concern  . None   Social History Narrative    Family History  Problem Relation Age of Onset  . Leukemia Father   .  Cancer Father   . Diabetes Maternal Grandmother   . Heart disease Paternal Grandmother   . Colon cancer Paternal Aunt 13  . Rheum arthritis Mother   . Heart disease Mother     2 coronary stents  . Heart disease Maternal Uncle   . Heart disease Maternal Uncle     Past Medical History  Diagnosis Date  . PONV (postoperative nausea and vomiting)     violent nausea/vomiting  . Hyperlipidemia     takes Atorvastatin daily  . Obesity   . Arthritis   . Anxiety     takes Citalopram daily  . Muscle spasm of back     takes Flexeril daily as needed  . Seasonal allergies     takes Claritin daily  . GERD (gastroesophageal reflux disease)     takes Omeprazole daily  . Hypertension     takes HCTZ daily as well as Propranolol  . Pneumonia     hx of-many,many yrs  ago  . History of bronchitis     last yr  . Migraine     hx of  . Rheumatoid arthritis   . Joint pain   . Joint swelling   . Chronic back pain     radiculopathy  . Urinary urgency   . Diabetes mellitus without complication     "pre"  . Insomnia     takes trazodone nightly  . History of shingles      Review of Systems  Constitutional: Negative for fever, chills and appetite change.  HENT: Negative for congestion, ear pain, postnasal drip, sinus pressure and sore throat.   Eyes: Negative for pain and redness.  Respiratory: Negative for cough, shortness of breath and wheezing.   Cardiovascular: Negative for leg swelling.  Gastrointestinal: Negative for nausea, vomiting, abdominal pain, diarrhea, constipation and blood in stool.  Endocrine: Negative for polyuria.  Genitourinary: Negative for dysuria, urgency, frequency and flank pain.  Musculoskeletal: Negative for gait problem.  Skin: Positive for wound (avulsion or right great toenail  ). Negative for rash.  Neurological: Negative for weakness and headaches.  Psychiatric/Behavioral: Negative for confusion and decreased concentration. The patient is not nervous/anxious.     Objective:  BP 118/70 mmHg  Pulse 40  Temp(Src) 98.3 F (36.8 C) (Oral)  Resp 18  SpO2 97%  BP Readings from Last 3 Encounters:  10/05/14 118/70  08/20/14 124/70  07/29/14 128/82    Wt Readings from Last 3 Encounters:  08/20/14 211 lb (95.709 kg)  07/26/14 214 lb (97.07 kg)  07/25/14 219 lb 6.4 oz (99.519 kg)    Physical Exam  Constitutional: She is oriented to person, place, and time. She appears well-developed and well-nourished.  HENT:  Head: Normocephalic and atraumatic.  Eyes: Conjunctivae are normal. Pupils are equal, round, and reactive to light.  Pulmonary/Chest: Effort normal.  Musculoskeletal: She exhibits no edema.  Neurological: She is alert and oriented to person, place, and time.  Skin: Skin is dry. Laceration (avulsion  right great nail with nail bed laceration.  NATI.  no FB) noted.  Psychiatric: She has a normal mood and affect. Her behavior is normal. Thought content normal.    Lab Results  Component Value Date   WBC 6.6 07/25/2014   HGB 11.8* 07/25/2014   HCT 36.1 07/25/2014   PLT 301 07/25/2014   GLUCOSE 123* 07/29/2014   CHOL 134 08/20/2014   TRIG 102 08/20/2014   HDL 51 08/20/2014   LDLCALC 63 08/20/2014   ALT 39* 07/25/2014  AST 22 07/25/2014   NA 141 07/29/2014   K 3.6 07/29/2014   CL 102 07/29/2014   CREATININE 0.59 07/29/2014   BUN 23 07/29/2014   CO2 29 07/29/2014   TSH 0.402 09/17/2013      .  Assessment & Plan:   [name] was seen today for nail problem.  Diagnoses and all orders for this visit:  Avulsion of nail  Need for prophylactic vaccination with combined diphtheria-tetanus-pertussis (DTP) vaccine Orders: -     Tdap vaccine greater than or equal to 7yo IM  Pain of toe of right foot  Other orders -     HYDROmorphone (DILAUDID) 2 MG tablet; 1-2 tabs q4h prn pain -     cephALEXin (KEFLEX) 500 MG capsule; Take 1 capsule (500 mg total) by mouth 4 (four) times daily.   I am having Ms. Ried start on HYDROmorphone and cephALEXin. I am also having her maintain her aspirin EC, cyclobenzaprine, folic acid, gabapentin, hydrochlorothiazide, loratadine, naproxen, ondansetron, potassium bicarbonate, propranolol ER, Methotrexate (PF), DULoxetine, traZODone, atorvastatin, omeprazole, and propranolol.  Meds ordered this encounter  Medications  . HYDROmorphone (DILAUDID) 2 MG tablet    Sig: 1-2 tabs q4h prn pain    Dispense:  30 tablet    Refill:  0  . cephALEXin (KEFLEX) 500 MG capsule    Sig: Take 1 capsule (500 mg total) by mouth 4 (four) times daily.    Dispense:  40 capsule    Refill:  0    Appropriate red flag conditions were discussed with the patient as well as actions that should be taken.  Patient expressed his understanding.  Follow-up: Return in about 1  week (around 10/12/2014).  Carmelina Dane, MD

## 2014-10-28 ENCOUNTER — Other Ambulatory Visit: Payer: Self-pay | Admitting: Family Medicine

## 2014-10-28 DIAGNOSIS — J302 Other seasonal allergic rhinitis: Secondary | ICD-10-CM

## 2014-10-30 NOTE — Telephone Encounter (Signed)
I will refill her oral antihistamine meds for allergies. It's a pretty benign medication. I do recommend she come back for a visit for her allergies sometime within the next year. Thank you!

## 2014-10-30 NOTE — Telephone Encounter (Signed)
Advise on refill. Pt has not been seen for allergies recently.

## 2014-12-19 ENCOUNTER — Telehealth: Payer: Self-pay | Admitting: *Deleted

## 2014-12-19 ENCOUNTER — Encounter: Payer: Self-pay | Admitting: Podiatry

## 2014-12-19 ENCOUNTER — Ambulatory Visit (INDEPENDENT_AMBULATORY_CARE_PROVIDER_SITE_OTHER): Payer: BLUE CROSS/BLUE SHIELD | Admitting: Podiatry

## 2014-12-19 VITALS — BP 120/69 | HR 60 | Resp 16

## 2014-12-19 DIAGNOSIS — R234 Changes in skin texture: Secondary | ICD-10-CM

## 2014-12-19 DIAGNOSIS — L989 Disorder of the skin and subcutaneous tissue, unspecified: Secondary | ICD-10-CM | POA: Diagnosis not present

## 2014-12-19 DIAGNOSIS — L603 Nail dystrophy: Secondary | ICD-10-CM | POA: Diagnosis not present

## 2014-12-19 NOTE — Telephone Encounter (Signed)
Toenail fragments sent to [facility] for definitive diagnosis of fungal elements. 

## 2014-12-19 NOTE — Progress Notes (Signed)
   Subjective:    Patient ID: [name], female    DOB: 1955/07/18, 59 y.o.   MRN: [id number]  HPI she presents today concerned of a toenail that was torn off recently. She states that the toenail has been raised up and was taken and the toenail actually healed off backwards. She was concerned about the way the nail looks. She denies any major trauma to the foot but she complains of other painful toenails which are thick yellow and rolled and she says.    Review of Systems  Constitutional: Positive for fatigue.  Musculoskeletal: Positive for arthralgias.  Psychiatric/Behavioral: The patient is nervous/anxious.   All other systems reviewed and are negative.      Objective:   Physical Exam : I have reviewed her past history medications allergy surgery social history. Pulses are strongly palpable bilateral. Neurologic sensorium is intact with Dorann Ou monofilament. Degenerative flexors are intact bilateral muscle strength +5 over 5 dorsiflexion plantarflexion inversion everters onto the musculatures intact. Orthopedic evaluation and x-rays all joints distal to the angle full range of motion without crepitation. Cutaneous evaluationof a well-hydrated uterus with exception of the nail plates. Nail plates are thick yellow dystrophic onychomycotic and currently painted blueThe hallux nail plate right does demonstrate a nail approximately half way grown out at this point. I see no signs of trauma and no infection.        Assessment & Plan:   assessment: nail dystrophy with probable onychomycosis.  Plan: at this point we took samples of the skin and nails today and sent for pathologic evaluation. I will notify her once her labs come back.

## 2014-12-23 DIAGNOSIS — M79673 Pain in unspecified foot: Secondary | ICD-10-CM

## 2014-12-24 ENCOUNTER — Other Ambulatory Visit: Payer: Self-pay | Admitting: Family Medicine

## 2015-01-07 ENCOUNTER — Telehealth: Payer: Self-pay | Admitting: *Deleted

## 2015-01-07 NOTE — Telephone Encounter (Signed)
Dr. Al Corpus states pt's fungal culture is +, needs an appt.  I informed pt of Dr. Geryl Rankins orders and transferred to scheduler.

## 2015-01-16 ENCOUNTER — Ambulatory Visit (INDEPENDENT_AMBULATORY_CARE_PROVIDER_SITE_OTHER): Payer: BLUE CROSS/BLUE SHIELD | Admitting: Podiatry

## 2015-01-16 ENCOUNTER — Encounter: Payer: Self-pay | Admitting: Podiatry

## 2015-01-16 VITALS — BP 138/84 | HR 64 | Resp 12

## 2015-01-16 DIAGNOSIS — Z79899 Other long term (current) drug therapy: Secondary | ICD-10-CM

## 2015-01-16 DIAGNOSIS — L603 Nail dystrophy: Secondary | ICD-10-CM

## 2015-01-16 LAB — CBC WITH DIFFERENTIAL/PLATELET
BASOS ABS: 0.1 10*3/uL (ref 0.0–0.1)
Basophils Relative: 1 % (ref 0–1)
EOS PCT: 5 % (ref 0–5)
Eosinophils Absolute: 0.4 10*3/uL (ref 0.0–0.7)
HEMATOCRIT: 38 % (ref 36.0–46.0)
Hemoglobin: 12.2 g/dL (ref 12.0–15.0)
LYMPHS PCT: 21 % (ref 12–46)
Lymphs Abs: 1.6 10*3/uL (ref 0.7–4.0)
MCH: 29 pg (ref 26.0–34.0)
MCHC: 32.1 g/dL (ref 30.0–36.0)
MCV: 90.3 fL (ref 78.0–100.0)
MPV: 10.5 fL (ref 8.6–12.4)
Monocytes Absolute: 0.4 10*3/uL (ref 0.1–1.0)
Monocytes Relative: 6 % (ref 3–12)
NEUTROS ABS: 5 10*3/uL (ref 1.7–7.7)
Neutrophils Relative %: 67 % (ref 43–77)
PLATELETS: 393 10*3/uL (ref 150–400)
RBC: 4.21 MIL/uL (ref 3.87–5.11)
RDW: 16.5 % — AB (ref 11.5–15.5)
WBC: 7.4 10*3/uL (ref 4.0–10.5)

## 2015-01-16 LAB — HEPATIC FUNCTION PANEL
ALT: 30 U/L — AB (ref 6–29)
AST: 27 U/L (ref 10–35)
Albumin: 3.9 g/dL (ref 3.6–5.1)
Alkaline Phosphatase: 107 U/L (ref 33–130)
BILIRUBIN DIRECT: 0.1 mg/dL (ref <=0.2)
BILIRUBIN TOTAL: 0.5 mg/dL (ref 0.2–1.2)
Indirect Bilirubin: 0.4 mg/dL (ref 0.2–1.2)
Total Protein: 6.4 g/dL (ref 6.1–8.1)

## 2015-01-16 MED ORDER — TERBINAFINE HCL 250 MG PO TABS: 250.0000 mg | ORAL_TABLET | Freq: Every day | ORAL | Status: DC

## 2015-01-16 NOTE — Progress Notes (Signed)
She presents today for follow-up of her fungal culture from her toenails.  Objective: Vital signs are stable she is alert and oriented 3. We discussed the fungal culture results today which was positive for onychomycosis.  Assessment: Onychomycosis.  Plan: We discussed the etiology pathology conservative versus surgical therapies. At this point she would like to try oral anti-fungals. We started her on Lamisil 250 mg tablets 1 by mouth daily 30. We also requested a liver profile. I will follow-up with her in 1 month for another liver profile and 90 days worth of medication. Should she have questions or concerns regarding this medication she will notify us immediately. Should her blood work on back abnormal we will notify her immediately.

## 2015-01-20 ENCOUNTER — Telehealth: Payer: Self-pay | Admitting: *Deleted

## 2015-01-20 NOTE — Telephone Encounter (Addendum)
-----   Message from Elinor Parkinson, [place] sent at 01/20/2015  7:51 AM EDT ----- Blood work looks good and may continue meds  I informed pt of Dr. Geryl Rankins orders.

## 2015-01-23 ENCOUNTER — Other Ambulatory Visit: Payer: Self-pay | Admitting: Family Medicine

## 2015-02-12 ENCOUNTER — Other Ambulatory Visit: Payer: Self-pay | Admitting: Family Medicine

## 2015-02-12 ENCOUNTER — Ambulatory Visit (INDEPENDENT_AMBULATORY_CARE_PROVIDER_SITE_OTHER): Payer: BLUE CROSS/BLUE SHIELD | Admitting: Family Medicine

## 2015-02-12 VITALS — BP 116/76 | HR 85 | Temp 98.1°F | Resp 18 | Ht 62.0 in | Wt 218.0 lb

## 2015-02-12 DIAGNOSIS — Z23 Encounter for immunization: Secondary | ICD-10-CM

## 2015-02-12 DIAGNOSIS — I10 Essential (primary) hypertension: Secondary | ICD-10-CM | POA: Diagnosis not present

## 2015-02-12 DIAGNOSIS — E7800 Pure hypercholesterolemia, unspecified: Secondary | ICD-10-CM

## 2015-02-12 DIAGNOSIS — M797 Fibromyalgia: Secondary | ICD-10-CM

## 2015-02-12 DIAGNOSIS — G4700 Insomnia, unspecified: Secondary | ICD-10-CM | POA: Diagnosis not present

## 2015-02-12 LAB — COMPREHENSIVE METABOLIC PANEL
ALBUMIN: 4.4 g/dL (ref 3.6–5.1)
ALT: 18 U/L (ref 6–29)
AST: 18 U/L (ref 10–35)
Alkaline Phosphatase: 88 U/L (ref 33–130)
BUN: 18 mg/dL (ref 7–25)
CALCIUM: 9.2 mg/dL (ref 8.6–10.4)
CHLORIDE: 98 mmol/L (ref 98–110)
CO2: 32 mmol/L — ABNORMAL HIGH (ref 20–31)
Creat: 0.78 mg/dL (ref 0.50–0.99)
Glucose, Bld: 94 mg/dL (ref 65–99)
Potassium: 3.4 mmol/L — ABNORMAL LOW (ref 3.5–5.3)
Sodium: 139 mmol/L (ref 135–146)
Total Bilirubin: 0.5 mg/dL (ref 0.2–1.2)
Total Protein: 6.7 g/dL (ref 6.1–8.1)

## 2015-02-12 MED ORDER — TRAZODONE HCL 50 MG PO TABS: 25.0000 mg | ORAL_TABLET | Freq: Every evening | ORAL | Status: DC | PRN

## 2015-02-12 MED ORDER — HYDROCHLOROTHIAZIDE 25 MG PO TABS: 25.0000 mg | ORAL_TABLET | Freq: Every day | ORAL | Status: DC

## 2015-02-12 MED ORDER — PROPRANOLOL HCL ER 80 MG PO CP24: 80.0000 mg | ORAL_CAPSULE | Freq: Every day | ORAL | Status: DC

## 2015-02-12 MED ORDER — ATORVASTATIN CALCIUM 20 MG PO TABS
20.0000 mg | ORAL_TABLET | Freq: Every evening | ORAL | Status: DC
Start: 2015-02-12 — End: 2016-07-22

## 2015-02-12 MED ORDER — DULOXETINE HCL 30 MG PO CPEP: 30.0000 mg | ORAL_CAPSULE | Freq: Two times a day (BID) | ORAL | Status: DC

## 2015-02-12 NOTE — Patient Instructions (Addendum)
[facility] Betty Anis, PA-C [address] [place], [place] [address]  [phone] I think that you should be able to see Denny Peon and get cared for but if you need a referral contact me.  Continue current medications   Double check if you can do make sure that the trazodone is the sleeping medication that you wanted  Return in about 6 months, sooner if problems

## 2015-02-12 NOTE — Progress Notes (Signed)
Patient ID: [name], female    DOB: 22-Nov-1955  Age: 59 y.o. MRN: [id number]  Chief Complaint  Patient presents with  . Medication Refill    all listed in epic   . Flu Vaccine    Subjective:   Patient is here for a regular visit. She needs her medicines refilled for insomnia, blood pressure, and cholesterol. She is frustrated that her rheumatologist moved to another practice and she has not been able to locate them. She has not been pleased with the new person she was assigned to, and would like to get back to the person she knows. She has had a lot of crepitance in her neck and knees and hurts all over. She is on number of medications for that.  She needs routine follow-up for the blood pressure and cholesterol. No chest pains or shortness of breath or GI or GU complaints.  Current allergies, medications, problem list, past/family and social histories reviewed.  Objective:  BP 116/76 mmHg  Pulse 85  Temp(Src) 98.1 F (36.7 C) (Oral)  Resp 18  Ht 5\' 2"  (1.575 m)  Wt 218 lb (98.884 kg)  BMI 39.86 kg/m2  SpO2 98%  Healthy-appearing. Good range of motion of neck and knees. Chest clear. Heart regular without murmurs. And soft without mass or tenderness.  Assessment & Plan:   Assessment: 1. Essential hypertension   2. High cholesterol   3. Fibromyalgia   4. Insomnia   5. Needs flu shot       Plan: Check her labs. I was able to locate , [facility] that she went to previously and recommended that she go back to her to evaluate the arthritic and fibromyalgia complaints. She is in agreement with this.  Orders Placed This Encounter  Procedures  . Flu Vaccine QUAD 36+ mos IM  . Comprehensive metabolic panel  . Lipid panel    Meds ordered this encounter  Medications  . propranolol ER (INDERAL LA) 80 MG 24 hr capsule    Sig: Take 1 capsule (80 mg total) by mouth daily. For high blood pressure    Dispense:  90 capsule    Refill:  3  . hydrochlorothiazide (HYDRODIURIL)  25 MG tablet    Sig: Take 1 tablet (25 mg total) by mouth daily. For high blood pressure    Dispense:  90 tablet    Refill:  3  . atorvastatin (LIPITOR) 20 MG tablet    Sig: Take 1 tablet (20 mg total) by mouth every evening. For High cholesterol/fat    Dispense:  90 tablet    Refill:  3  . DULoxetine (CYMBALTA) 30 MG capsule    Sig: Take 1 capsule (30 mg total) by mouth 2 (two) times daily. For depression    Dispense:  60 capsule    Refill:  5  . traZODone (DESYREL) 50 MG tablet    Sig: Take 0.5-1 tablets (25-50 mg total) by mouth at bedtime as needed for sleep.    Dispense:  60 tablet    Refill:  5         Patient Instructions  [facility] Rheumatology [facility], PA-C [address] [place], [place]  [phone] I think that you should be able to see [phone] and get cared for but if you need a referral contact me.  Continue current medications   Double check if you can do make sure that the trazodone is the sleeping medication that you wanted  Return in  about 6 months, sooner if problems     Return in about 6 months (around 08/13/2015).   Mareena Cavan, MD 02/12/2015

## 2015-02-12 NOTE — Progress Notes (Deleted)
[facility] [address], [place] [address] [phone]  Date:  02/12/2015   Name:  [name]   DOB:  February 23, 1956   MRN:  [id number]  PCP:  Tally Due, MD    History of Present Illness:  [name] is a 59 y.o. female patient who presents to [facility] for chief complaint    Patient Active Problem List   Diagnosis Date Noted  . Major depressive disorder, single episode, severe without psychotic features (HCC)   . 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 Fibromyalgia 02/19/2014  . Hypokalemia 09/18/2013  . Family history of ischemic heart disease   . Episode of dizziness after NTG while walking to [place] 09/17/2013  . Inflammatory arthritis (HCC) 09/12/2013  . Elevated cholesterol 04/11/2013  . Obesity, Class II, BMI 35-39.9, with comorbidity (HCC) 03/14/2013  . Generalized anxiety disorder 08/23/2012  . Insomnia 08/14/2012  . HTN (hypertension) 06/16/2012  . Migraine, unspecified, without mention of intractable migraine without mention of status migrainosus 06/16/2012    Past Medical History  Diagnosis Date  . PONV (postoperative nausea and vomiting)     violent nausea/vomiting  . Hyperlipidemia     takes Atorvastatin daily  . Obesity   . Arthritis   . Anxiety     takes Citalopram daily  . Muscle spasm of back     takes Flexeril daily as needed  . Seasonal allergies     takes Claritin daily  . GERD (gastroesophageal reflux disease)     takes Omeprazole daily  . Hypertension     takes HCTZ daily as well as Propranolol  . Pneumonia     hx of-many,many yrs ago  . History of bronchitis     last yr  . Migraine     hx of  . Rheumatoid arthritis (HCC)   . Joint pain   . Joint swelling   . Chronic back pain     radiculopathy  . Urinary urgency   . Diabetes mellitus without complication (HCC)     "pre"  . Insomnia     takes trazodone nightly  . History of shingles     Past Surgical History  Procedure Laterality Date  .  Back surgery    . Abdominal hysterectomy  2003  . Cholecystectomy N/A 06/27/2012    Procedure: LAPAROSCOPIC CHOLECYSTECTOMY WITH INTRAOPERATIVE CHOLANGIOGRAM;  Surgeon: Currie Paris, MD;  Location: [facility];  Service: General;  Laterality: N/A;  . Carpal tunnel release Bilateral   . Neck surgery    . Colonoscopy    . Lumbar laminectomy/decompression microdiscectomy Right 04/23/2014    Procedure: Right L5-S1 Laminectomy/Foraminotomy;  Surgeon: Karn Cassis, MD;  Location: [facility];  Service: Neurosurgery;  Laterality: Right;  Right L5-S1 Laminectomy/Foraminotomy  . Lumbar wound debridement N/A 05/01/2014    Procedure: EXPLORATION OF LUMBAR WOUND;  Surgeon: Karn Cassis, MD;  Location: [facility];  Service: Neurosurgery;  Laterality: N/A;  EXPLORATION OF LUMBAR WOUND  . Lumbar wound debridement N/A 05/08/2014    Procedure: LUMBAR WOUND Exploration;  Surgeon: Karn Cassis, MD;  Location: [facility];  Service: Neurosurgery;  Laterality: N/A;    Social History  Substance Use Topics  . Smoking status: Former Smoker    Types: Cigarettes  . Smokeless tobacco: Never Used     Comment: quit smoking 41yrs ago  . Alcohol Use: No    Family History  Problem Relation Age of Onset  . Leukemia Father   .  Cancer Father   . Diabetes Maternal Grandmother   . Heart disease Paternal Grandmother   . Colon cancer Paternal Aunt 43  . Rheum arthritis Mother   . Heart disease Mother     2 coronary stents  . Heart disease Maternal Uncle   . Heart disease Maternal Uncle     Allergies  Allergen Reactions  . Hydrocodone Itching    Medication list has been reviewed and updated.  Current Outpatient Prescriptions on File Prior to Visit  Medication Sig Dispense Refill  . aspirin EC 81 MG tablet Take 1 tablet (81 mg total) by mouth daily. For health heallth    . atorvastatin (LIPITOR) 20 MG tablet Take 1 tablet (20 mg total) by mouth every evening. For High cholesterol/fat 90 tablet 3   . DULoxetine (CYMBALTA) 30 MG capsule Take 1 capsule (30 mg total) by mouth 2 (two) times daily. For depression 60 capsule 11  . folic acid (FOLVITE) 1 MG tablet Take 3 tablets (3 mg total) by mouth daily. For low Folate  1  . gabapentin (NEURONTIN) 300 MG capsule Take 1 capsule (300 mg total) by mouth 2 (two) times daily. For agitation 60 capsule 0  . hydrochlorothiazide (HYDRODIURIL) 25 MG tablet Take 1 tablet (25 mg total) by mouth daily. For high blood pressure 90 tablet 3  . loratadine (CLARITIN) 10 MG tablet TAKE 1 TABLET BY MOUTH DAILY. 90 tablet 3  . Methotrexate, PF, 10 MG/0.2ML SOAJ Inject every friday    . naproxen (NAPROSYN) 500 MG tablet Take 1 tablet (500 mg total) by mouth 2 (two) times daily with a meal. For moderate pain    . omeprazole (PRILOSEC) 20 MG capsule Take 1 capsule (20 mg total) by mouth daily. PATIENT NEEDS MED REFILL VISIT FOR ADDITIONAL REFILLS 30 capsule 11  . propranolol ER (INDERAL LA) 80 MG 24 hr capsule Take 1 capsule (80 mg total) by mouth daily. For high blood pressure 30 capsule 6  . terbinafine (LAMISIL) 250 MG tablet Take 1 tablet (250 mg total) by mouth daily. (Patient not taking: Reported on 02/12/2015) 30 tablet 0   No current facility-administered medications on file prior to visit.    ROS   Physical Examination: BP 116/76 mmHg  Pulse 85  Temp(Src) 98.1 F (36.7 C) (Oral)  Resp 18  Ht 5\' 2"  (1.575 m)  Wt 218 lb (98.884 kg)  BMI 39.86 kg/m2  SpO2 98% Ideal Body Weight: Weight in (lb) to have BMI = 25: 136.4  Physical Exam   Assessment and Plan: [name] is a 59 y.o. female who is here today  There are no diagnoses linked to this encounter.  75, PA-C [facility] 02/12/2015 2:22 PM

## 2015-02-13 ENCOUNTER — Encounter (INDEPENDENT_AMBULATORY_CARE_PROVIDER_SITE_OTHER): Payer: BLUE CROSS/BLUE SHIELD | Admitting: Podiatry

## 2015-02-13 ENCOUNTER — Encounter: Payer: Self-pay | Admitting: Podiatry

## 2015-02-13 ENCOUNTER — Other Ambulatory Visit: Payer: Self-pay

## 2015-02-13 ENCOUNTER — Other Ambulatory Visit: Payer: Self-pay | Admitting: Family Medicine

## 2015-02-13 DIAGNOSIS — I10 Essential (primary) hypertension: Secondary | ICD-10-CM

## 2015-02-13 DIAGNOSIS — L603 Nail dystrophy: Secondary | ICD-10-CM

## 2015-02-13 MED ORDER — PROPRANOLOL HCL ER 80 MG PO CP24: 80.0000 mg | ORAL_CAPSULE | Freq: Every day | ORAL | Status: DC

## 2015-02-13 NOTE — Progress Notes (Signed)
This encounter was created in error - please disregard.

## 2015-03-21 ENCOUNTER — Encounter: Payer: Self-pay | Admitting: Podiatry

## 2015-03-21 ENCOUNTER — Other Ambulatory Visit: Payer: Self-pay | Admitting: Family Medicine

## 2015-03-24 NOTE — Telephone Encounter (Signed)
Dr Alwyn Ren, you saw pt for check up and med refill in Oct, but this med was not on her med list at the time, therefore I wanted to check bf I refilled it. It looks like we have Rxd it chronically, with RFs in the past.

## 2015-03-25 NOTE — Telephone Encounter (Signed)
Refill one time.  If she is having chronic nausea symptoms advise she needs checked.

## 2015-04-22 ENCOUNTER — Other Ambulatory Visit: Payer: Self-pay | Admitting: Family Medicine

## 2015-05-16 LAB — CBC AND DIFFERENTIAL
HEMATOCRIT: 39 (ref 36–46)
Hemoglobin: 12.5 (ref 12.0–16.0)
PLATELETS: 330 (ref 150–399)
WBC: 5.3

## 2015-05-16 LAB — HEPATIC FUNCTION PANEL
ALT: 40 — AB (ref 7–35)
AST: 24 (ref 13–35)
Alkaline Phosphatase: 106 (ref 25–125)
Bilirubin, Total: 0.6

## 2015-05-16 LAB — BASIC METABOLIC PANEL
BUN: 17 (ref 4–21)
Creatinine: 0.9 (ref 0.5–1.1)
GLUCOSE: 100
Potassium: 3.5 (ref 3.4–5.3)

## 2015-06-24 ENCOUNTER — Other Ambulatory Visit: Payer: Self-pay | Admitting: Family Medicine

## 2015-07-02 HISTORY — PX: ABDOMINOPLASTY: SUR9

## 2015-08-28 ENCOUNTER — Other Ambulatory Visit: Payer: Self-pay | Admitting: Family Medicine

## 2015-09-30 ENCOUNTER — Other Ambulatory Visit: Payer: Self-pay | Admitting: Family Medicine

## 2015-10-01 ENCOUNTER — Other Ambulatory Visit: Payer: Self-pay | Admitting: Family Medicine

## 2015-10-17 IMAGING — CT CT L SPINE W/O CM
2 of 3 series · 14 of 33 positions shown · non-contrast
Comparison: Lumbar myelogram 04/04/2014

CLINICAL DATA: Headache.  Recent lumbar spine surgery.

EXAM:
CT LUMBAR SPINE WITHOUT CONTRAST
TECHNIQUE: Multidetector CT imaging of the lumbar spine was performed without
intravenous contrast administration. Multiplanar CT image
reconstructions were also generated.

[Series 207: st cor · coronal · 0.49mm/px · 3 of 58 slices shown]
[im 12/58  bone]
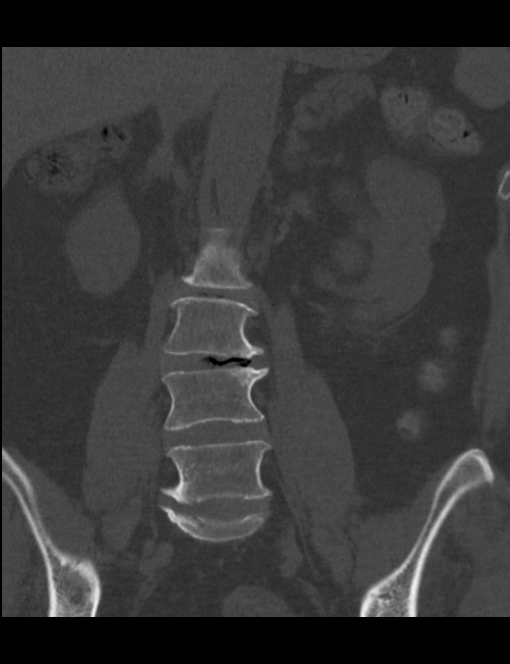
[im 23/58  bone]
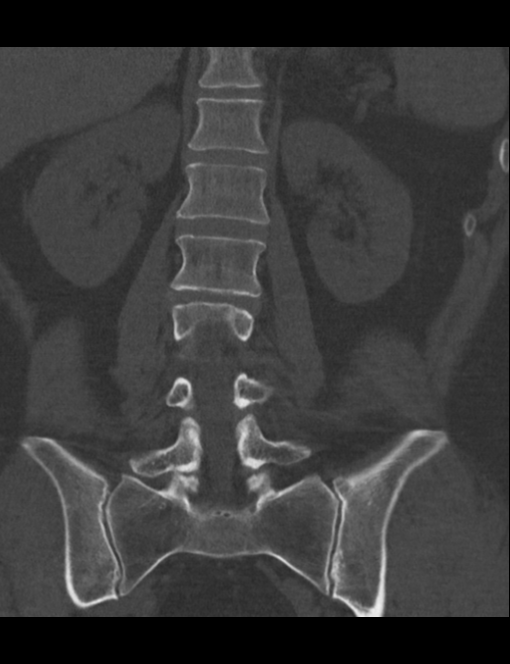
[im 35/58  bone]
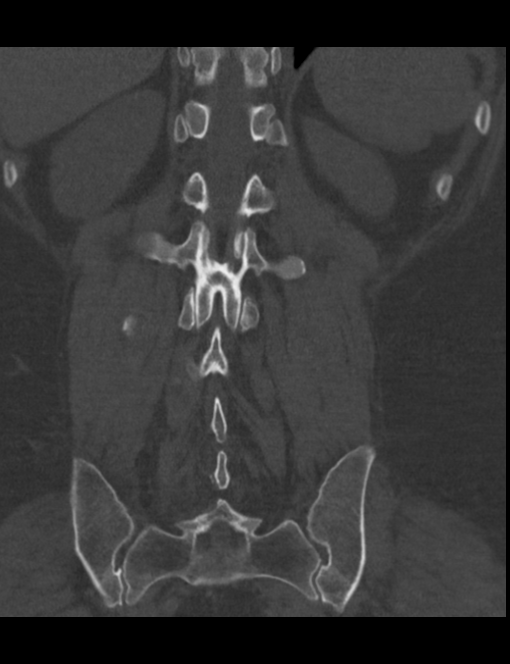

[Series 208: st sag · sagittal · 0.49mm/px · 11 of 67 slices shown]
[im 6/67  bone]
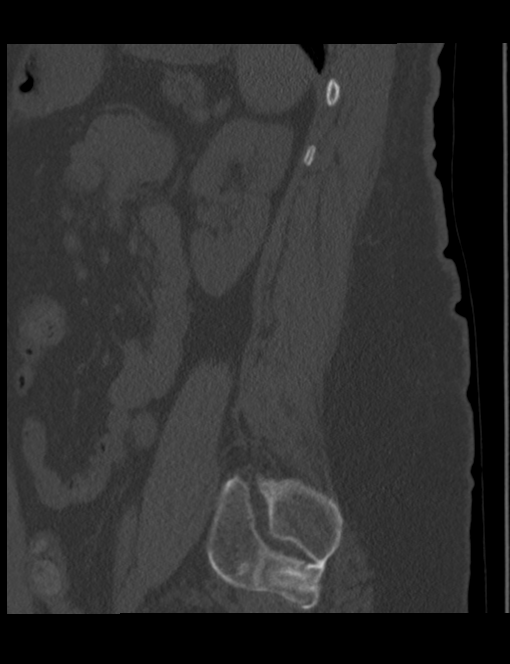
[im 12/67  bone]
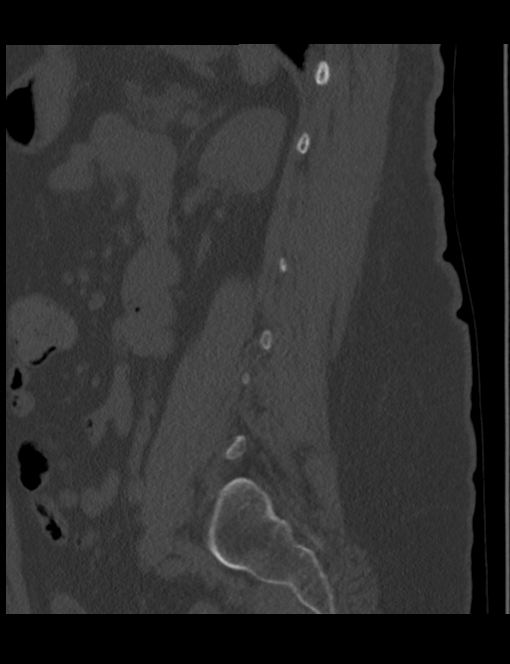
[im 17/67  bone]
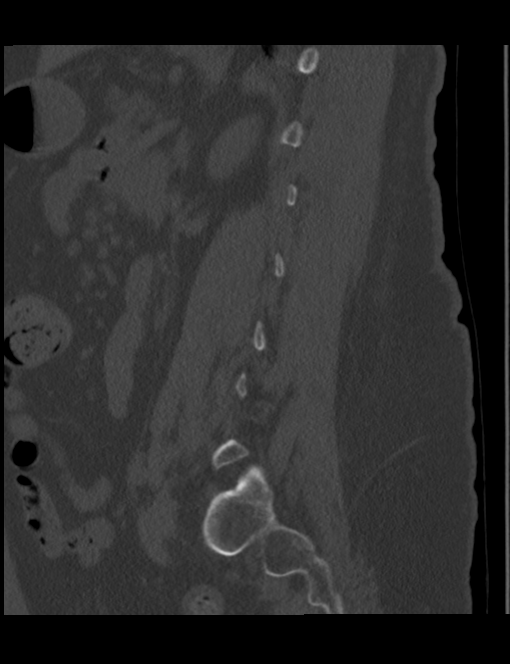
[im 23/67  bone]
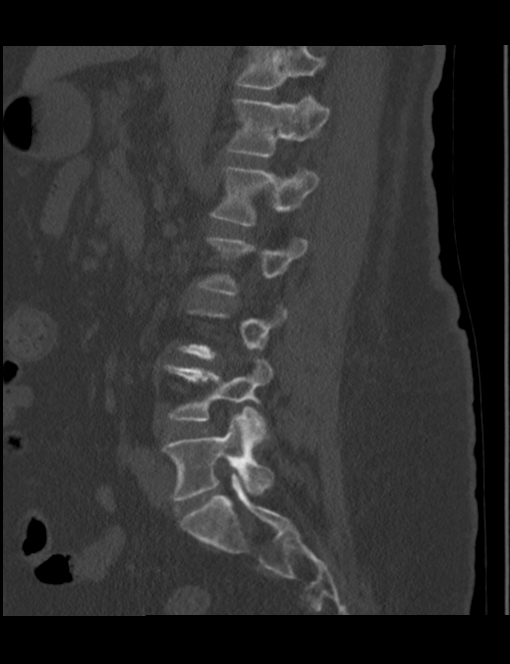
[im 28/67  bone]
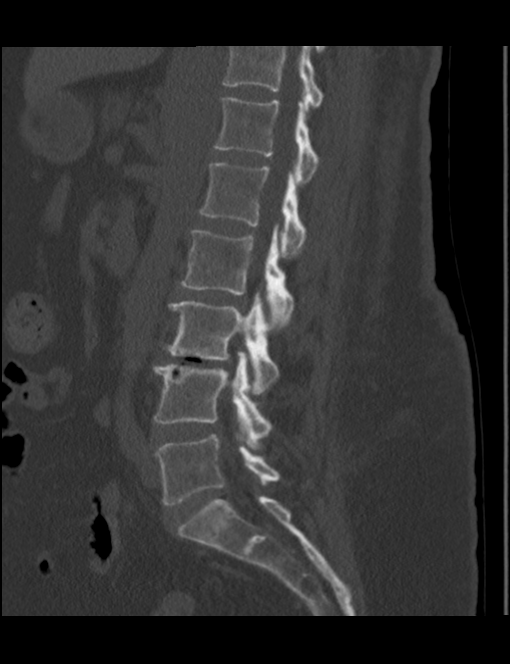
[im 34/67  bone]
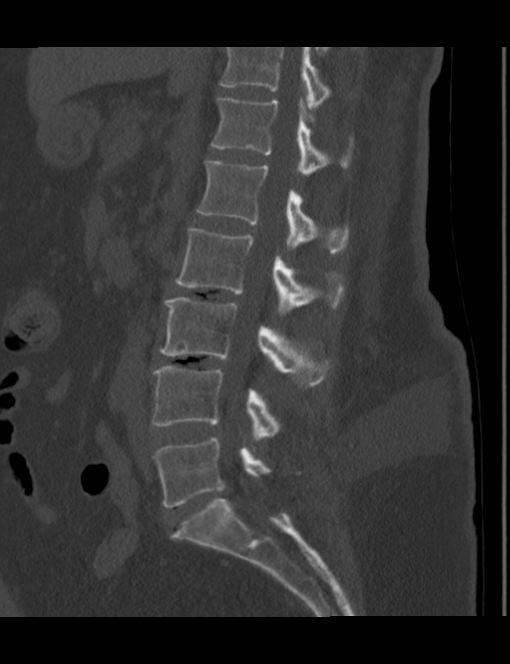
[im 39/67  bone]
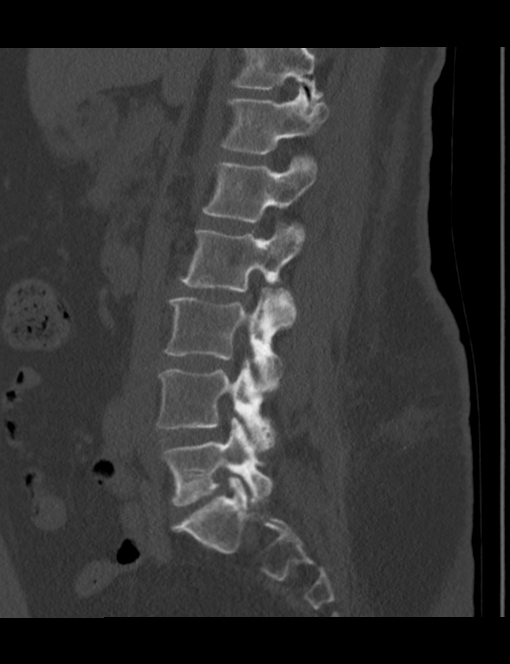
[im 45/67  bone]
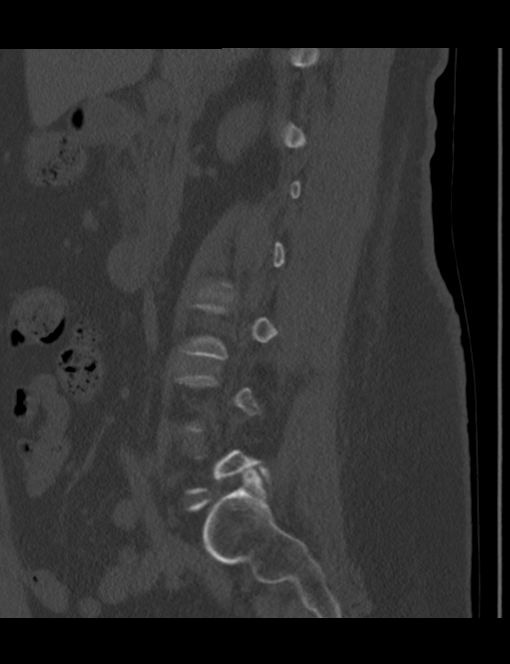
[im 50/67  bone]
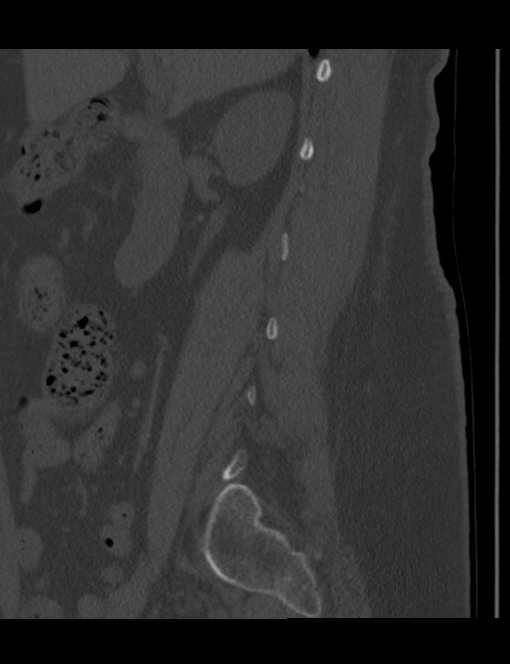
[im 56/67  bone]
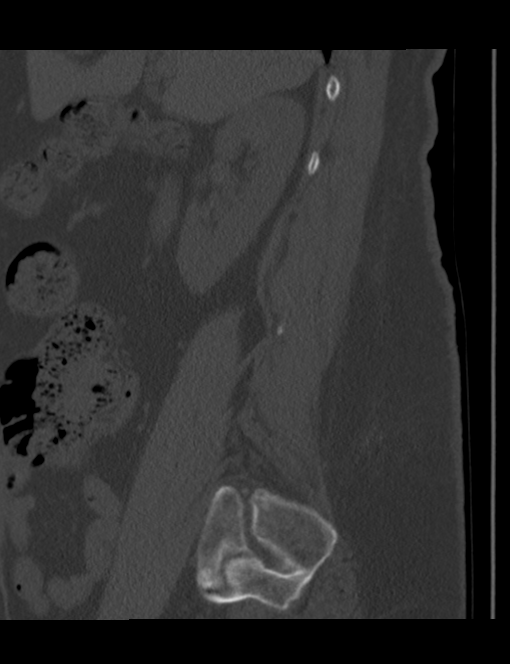
[im 61/67  bone]
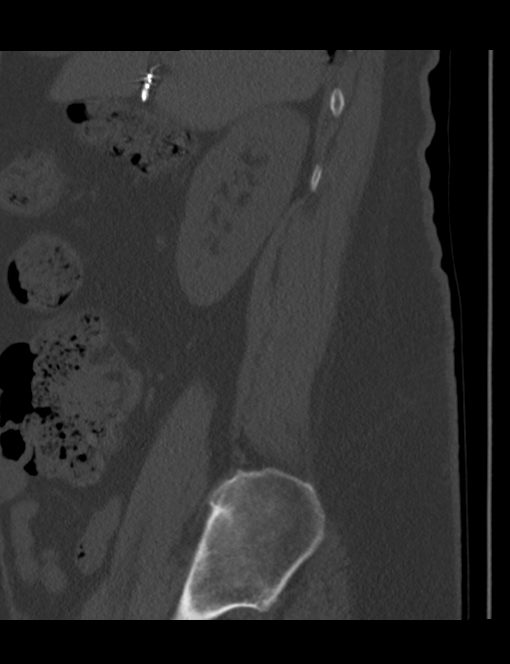

[14 of 33 positions shown; findings below may reference images not displayed]

FINDINGS: The patient is status post right laminectomy at L5. There is an
extra-axial fluid or soft tissue collection at the laminectomy site
measuring 17 x 8 mm on image 111 of series 2 a to. The fluid
collection is noted within the subcutaneous fat measuring 6.8 x
cm on the sagittal images. There may be a connection to the
laminectomy site. This appears to be along the course of the
surgery.

Endplate changes on the right at L5-S1 are stable. Left-sided
endplate changes are again noted at L3-4 with a vacuum disc. Vacuum
disc is present at L2-3 as well. The lumbar spine is otherwise
stable. Facet degenerative changes again noted L3-4 and L4-5.

Limited imaging of the abdomen and pelvis is unremarkable.
IMPRESSION: 1. 17 x 8 mm fluid or soft tissue collection at the site of the
right laminectomy. Is CSF leak is not excluded. However the CT could
represent atypical postoperative seroma. It is unclear if there is
mass effect on the thecal sac. MRI would be more sensitive and
specific for the evaluation of an extra-axial fluid collection.
2. A more superficial fluid collection in the subcutaneous fat
measures 6.8 x 3.2 cm in the sagittal images.
3. Spondylosis through the remainder of the lumbar spine,
particularly at L2-3 and L3-4 is stable.

## 2015-11-08 ENCOUNTER — Ambulatory Visit (INDEPENDENT_AMBULATORY_CARE_PROVIDER_SITE_OTHER): Payer: BLUE CROSS/BLUE SHIELD | Admitting: Osteopathic Medicine

## 2015-11-08 VITALS — BP 118/78 | HR 91 | Temp 98.1°F | Resp 18 | Ht 62.0 in | Wt 211.0 lb

## 2015-11-08 DIAGNOSIS — I10 Essential (primary) hypertension: Secondary | ICD-10-CM | POA: Diagnosis not present

## 2015-11-08 DIAGNOSIS — N62 Hypertrophy of breast: Secondary | ICD-10-CM | POA: Diagnosis not present

## 2015-11-08 DIAGNOSIS — Z79899 Other long term (current) drug therapy: Secondary | ICD-10-CM | POA: Insufficient documentation

## 2015-11-08 DIAGNOSIS — K219 Gastro-esophageal reflux disease without esophagitis: Secondary | ICD-10-CM | POA: Diagnosis not present

## 2015-11-08 DIAGNOSIS — M549 Dorsalgia, unspecified: Secondary | ICD-10-CM

## 2015-11-08 DIAGNOSIS — G8929 Other chronic pain: Secondary | ICD-10-CM | POA: Diagnosis not present

## 2015-11-08 DIAGNOSIS — Z0000 Encounter for general adult medical examination without abnormal findings: Secondary | ICD-10-CM

## 2015-11-08 DIAGNOSIS — M546 Pain in thoracic spine: Secondary | ICD-10-CM | POA: Diagnosis not present

## 2015-11-08 MED ORDER — ZOSTER VACCINE LIVE 19400 UNT/0.65ML ~~LOC~~ SUSR: 0.6500 mL | Freq: Once | SUBCUTANEOUS | Status: DC

## 2015-11-08 MED ORDER — OMEPRAZOLE 20 MG PO CPDR: 20.0000 mg | DELAYED_RELEASE_CAPSULE | Freq: Every day | ORAL | Status: DC

## 2015-11-08 MED ORDER — LOSARTAN POTASSIUM 50 MG PO TABS: 50.0000 mg | ORAL_TABLET | Freq: Every day | ORAL | Status: DC

## 2015-11-08 NOTE — Patient Instructions (Addendum)
     IF you received an x-ray today, you will receive an invoice from [facility]. Please contact [facility] at [phone] with questions or concerns regarding your invoice.   IF you received labwork today, you will receive an invoice from [employer]. Please contact [facility] at [phone] with questions or concerns regarding your invoice.   Our billing staff will not be able to assist you with questions regarding bills from these companies.  You will be contacted with the lab results as soon as they are available. The fastest way to get your results is to activate your My Chart account. Instructions are located on the last page of this paperwork. If you have not heard from [place] regarding the results in 2 weeks, please contact this office.     We recommend that you schedule a mammogram for breast cancer screening. Typically, you do not need a referral to do this. Please contact a local imaging center to schedule your mammogram.  [facility] - [phone]  *ask for the Radiology Department [facility]) - [phone] or [phone]  [facility] - [phone] [facility] - [phone] [facility] - [phone]  *ask for the Radiology Department [facility] - [phone]  *ask for the Radiology Department [facility] - [phone]  *ask for the Mammography Department [facility] - [phone]    MEDICINES for WEIGHT LOSS Call about insurance coverage, research these medicines to see if one might interest you   Qsymia (Phentermine and Topiramate)  Saxenda (Liraglutide 3 mg/day)  Contrave (Bupropion and Naltrexone)  Lorcaserin (Belviq or Belviq XR)  Orlistat (Xenical, Alli)  Bupropion (Wellbutrin)  Please also contact your insurance company about local surgeons to perform breast reduction surgery, we will be happy  to place a referral!  Please return to the clinic for blood pressure recheck on the new medication, discuss anti-obesity medications further, fasting blood work.

## 2015-11-08 NOTE — Progress Notes (Signed)
HPI: [name] is a 60 y.o. female who presents to [facility] 11/08/2015 for chief complaint of:  Chief Complaint  Patient presents with  . Annual Exam  . Medication Refill    propranolol and prilosec     . Patient here for annual physical exam. Also requesting refill on propranolol and Prilosec.   See below for preventive care review  Medication list updated, patient is following with rheumatology for fiber myalgia/rheumatoid arthritis area has been feeling better since taken off of several of her medications but still reports some fatigue.  Hypertension: Well-controlled today. Patient is on Hydrocort thiazide and propranolol. She states she did not recall if she was placed on beta blocker for headache prophylaxis and/or anxiety issues.  Enlarged breasts: Patient requests information about breast reduction surgery  Obesity: Patient requests information about it obesity medications / weight reduction.    Past medical, social and family history reviewed: Past Medical History  Diagnosis Date  . PONV (postoperative nausea and vomiting)     violent nausea/vomiting  . Hyperlipidemia     takes Atorvastatin daily  . Obesity   . Arthritis   . Anxiety     takes Citalopram daily  . Muscle spasm of back     takes Flexeril daily as needed  . Seasonal allergies     takes Claritin daily  . GERD (gastroesophageal reflux disease)     takes Omeprazole daily  . Hypertension     takes HCTZ daily as well as Propranolol  . Pneumonia     hx of-many,many yrs ago  . History of bronchitis     last yr  . Migraine     hx of  . Rheumatoid arthritis (HCC)   . Joint pain   . Joint swelling   . Chronic back pain     radiculopathy  . Urinary urgency   . Diabetes mellitus without complication (HCC)     "pre"  . Insomnia     takes trazodone nightly  . History of shingles    Past Surgical History  Procedure Laterality Date  . Back surgery    . Abdominal  hysterectomy  2003  . Cholecystectomy N/A 06/27/2012    Procedure: LAPAROSCOPIC CHOLECYSTECTOMY WITH INTRAOPERATIVE CHOLANGIOGRAM;  Surgeon: Currie Paris, MD;  Location: [facility];  Service: General;  Laterality: N/A;  . Carpal tunnel release Bilateral   . Neck surgery    . Colonoscopy    . Lumbar laminectomy/decompression microdiscectomy Right 04/23/2014    Procedure: Right L5-S1 Laminectomy/Foraminotomy;  Surgeon: Karn Cassis, MD;  Location: [facility];  Service: Neurosurgery;  Laterality: Right;  Right L5-S1 Laminectomy/Foraminotomy  . Lumbar wound debridement N/A 05/01/2014    Procedure: EXPLORATION OF LUMBAR WOUND;  Surgeon: Karn Cassis, MD;  Location: [facility];  Service: Neurosurgery;  Laterality: N/A;  EXPLORATION OF LUMBAR WOUND  . Lumbar wound debridement N/A 05/08/2014    Procedure: LUMBAR WOUND Exploration;  Surgeon: Karn Cassis, MD;  Location: [facility];  Service: Neurosurgery;  Laterality: N/A;   Social History  Substance Use Topics  . Smoking status: Former Smoker    Types: Cigarettes  . Smokeless tobacco: Never Used     Comment: quit smoking 36yrs ago  . Alcohol Use: No   Family History  Problem Relation Age of Onset  . Leukemia Father   . Cancer Father   . Diabetes Maternal Grandmother   . Heart disease Paternal Grandmother   .  Colon cancer Paternal Aunt 88  . Rheum arthritis Mother   . Heart disease Mother     2 coronary stents  . Heart disease Maternal Uncle   . Heart disease Maternal Uncle     Current Outpatient Prescriptions  Medication Sig Dispense Refill  . aspirin EC 81 MG tablet Take 1 tablet (81 mg total) by mouth daily. For health heallth    . atorvastatin (LIPITOR) 20 MG tablet Take 1 tablet (20 mg total) by mouth every evening. For High cholesterol/fat 90 tablet 3  . DULoxetine (CYMBALTA) 30 MG capsule Take 1 capsule (30 mg total) by mouth 2 (two) times daily. For depression 60 capsule 5  . hydrochlorothiazide (HYDRODIURIL) 25  MG tablet Take 1 tablet (25 mg total) by mouth daily. For high blood pressure 90 tablet 3  . hydrochlorothiazide (HYDRODIURIL) 25 MG tablet TAKE 1 TABLET BY MOUTH DAILY. PATIENT NEEDS BLOOD PRESSURE CHECK UP FOR ADDITIONAL REFILLS 90 tablet 2  . loratadine (CLARITIN) 10 MG tablet TAKE 1 TABLET BY MOUTH DAILY. 90 tablet 3  . omeprazole (PRILOSEC) 20 MG capsule TAKE 1 CAPSULE BY MOUTH DAILY. 30 capsule 0  . ondansetron (ZOFRAN-ODT) 4 MG disintegrating tablet DISSOLVE 1 TABLET ON TONGUE EVERY 8 HOURS AS NEEDED FOR NAUSEA OR VOMITING "NO MORE REFILLS WITHOUT OFFICE VISIT" 30 tablet 0  . propranolol ER (INDERAL LA) 80 MG 24 hr capsule Take 1 capsule (80 mg total) by mouth daily. For high blood pressure 90 capsule 3  . traZODone (DESYREL) 50 MG tablet TAKE 2 TABLETS BY MOUTH AT BEDTIME AS NEEDED FOR SLEEP. 60 tablet 0  . gabapentin (NEURONTIN) 300 MG capsule Take 1 capsule (300 mg total) by mouth 2 (two) times daily. For agitation (Patient not taking: Reported on 11/08/2015) 60 capsule 0  . Methotrexate, PF, 10 MG/0.2ML SOAJ Reported on 11/08/2015    . naproxen (NAPROSYN) 500 MG tablet Take 1 tablet (500 mg total) by mouth 2 (two) times daily with a meal. For moderate pain (Patient not taking: Reported on 11/08/2015)    . terbinafine (LAMISIL) 250 MG tablet Take 1 tablet (250 mg total) by mouth daily. (Patient not taking: Reported on 02/12/2015) 30 tablet 0   No current facility-administered medications for this visit.   Allergies  Allergen Reactions  . Hydrocodone Itching      Review of Systems: CONSTITUTIONAL:  No  fever, no chills, No  unintentional weight changes, (+) fatigue HEAD/EYES/EARS/NOSE/THROAT: No  headache, no vision change CARDIAC: No  chest pain, No  pressure RESPIRATORY: No  cough, No  shortness of breath/wheeze GASTROINTESTINAL: No  nausea, No  vomiting, No  abdominal pain, No  blood in stool, No  diarrhea, No  constipation  MUSCULOSKELETAL: No  myalgia/arthralgia GENITOURINARY: No   incontinence, No  abnormal genital bleeding/discharge, patient is status post total hysterectomy SKIN: No  rash/wounds/concerning lesions NEUROLOGIC: No  weakness, No  dizziness PSYCHIATRIC: No  concerns with depression, No  concerns with anxiety, No sleep problems  Exam:  BP 118/78 mmHg  Pulse 91  Temp(Src) 98.1 F (36.7 C) (Oral)  Resp 18  Ht 5\' 2"  (1.575 m)  Wt 211 lb (95.709 kg)  BMI 38.58 kg/m2  SpO2 96% Constitutional: VS see above. General Appearance: alert, well-developed, well-nourished, NAD Eyes: Normal lids and conjunctive, non-icteric sclera, PERRLA Ears, Nose, Mouth, Throat: MMM, Normal external inspection ears/nares/mouth/lips/gums, TM normal bilaterally. Pharynx no erythema, no exudate.  Neck: No masses, trachea midline. No thyroid enlargement/tenderness/mass appreciated. No lymphadenopathy Respiratory: Normal respiratory effort. no  wheeze, no rhonchi, no rales Cardiovascular: S1/S2 normal, no murmur, no rub/gallop auscultated. RRR. No lower extremity edema. Gastrointestinal: Nontender, no masses. No hepatomegaly, no splenomegaly. No hernia appreciated. Bowel sounds normal. Rectal exam deferred.  Musculoskeletal: Gait normal. No clubbing/cyanosis of digits. Increased thoracic kyphosis Neurological: No cranial nerve deficit on limited exam. Motor and sensation intact and symmetric Skin: warm, dry, intact. No rash/ulcer. No concerning nevi or subq nodules on limited exam.   Psychiatric: Normal judgment/insight. Normal mood and affect. Oriented x3.    Recent lab results reviewed: Cholesterol about a year ago normal. Other labs back in October 2016, no concerns.   ASSESSMENT/PLAN:  Annual physical exam - Up-to-date except for mammogram and shingles vaccine. Not candidate for Pap due to hysterectomy for benign disease - Plan: CBC, COMPLETE METABOLIC PANEL WITH GFR, TSH, VITAMIN D 25 Hydroxy (Vit-D Deficiency, Fractures), Lipid panel  Gastroesophageal reflux disease  without esophagitis - Patient advised on risks of long-term PPI use, trial over-the-counter Zantac and if this controls GERD continue. Okay to continue PPI under informed consent.  - Plan: omeprazole (PRILOSEC) 20 MG capsule  Essential hypertension - Discontinuation of propranolol and switching to losartan due to concern for beta blocker possibly affecting fatigue, follow-up in office for recheck - Plan: losartan (COZAAR) 50 MG tablet  Large breasts - Patient advised contact insurance company regarding participating surgeons to perform breast reduction surgery. Comorbid back pain, frequent rash under breasts  Chronic upper back pain  Morbid obesity, unspecified obesity type (HCC) - Patient given list of weight loss medications, contact insurance regarding coverage and research medicines, RTC to consider prescription     FEMALE PREVENTIVE CARE  ANNUAL SCREENING/COUNSELING Tobacco - no - previous 1/2 ppd x 10 years total  Alcohol - none Diet/Exercise - HEALTHY HABITS DISCUSSED TO DECREASE CV RISK - has cut back on sugar, drinks more water  Depression - PQH2 Negative Domestic violence concerns - no HTN SCREENING - SEE VITALS Vaccination status - SEE BELOW  SEXUAL HEALTH Sexually active in the past year - no With - No STI - The patient denies history of sexually transmitted disease. STI testing today? - no   INFECTIOUS DISEASE SCREENING HIV - all adults 15-65 - does not need GC/CT - sexually active - does not need HepC - DOB [birth year] - does not need TB - does not need  DISEASE SCREENING Lipid - needs DM2 - needs Osteoporosis - does not need  CANCER SCREENING Cervical - does not need Breast - MAMMO - needs Lung - does not need Colon - does not need  ADULT VACCINATION Influenza - was not indicated Td - was not indicated HPV - was not indicated Zoster - was given Pneumonia - was not indicated  OTHER Fall - exercise and Vit D age 70+ - needs Consider ASA - age 104-59 -  does not need    Visit summary printed and instructions reviewed with the patient. ER/RTC precautions were reviewed. All questions answered. Return in about 2 weeks (around 11/22/2015), or sooner if needed, for BLOOD PRESSURE RECHECK ON NEW MEDICINE, LAB DRAW, DISCUSS WEIGHT LOSS MEDICINES.

## 2015-12-22 ENCOUNTER — Other Ambulatory Visit: Payer: Self-pay | Admitting: Family Medicine

## 2015-12-22 ENCOUNTER — Other Ambulatory Visit: Payer: Self-pay | Admitting: Urgent Care

## 2015-12-22 DIAGNOSIS — J302 Other seasonal allergic rhinitis: Secondary | ICD-10-CM

## 2015-12-26 NOTE — Telephone Encounter (Signed)
Sent in 1 mos RF. Noticed that labs were ordered but not drawn at her annual in July. Checked with lab and they were put in as a future order, instead of to be done at time of visit. Called pt and advised a RF was sent. She was not aware of a plan to come back for labs, so it looks like the orders were put in as future orders in error and the provider meant to have them done at time of visit. Pt will return for a Lab Only visit for fasting labs.

## 2015-12-26 NOTE — Telephone Encounter (Signed)
Patient called and stated she requested her Lipator on Monday and no one has gotten back with her and she is completely out of this medicine, can someone please call this into [facility] on [address]. Thank you

## 2015-12-30 ENCOUNTER — Other Ambulatory Visit: Payer: Self-pay | Admitting: Family Medicine

## 2015-12-30 ENCOUNTER — Other Ambulatory Visit: Payer: Self-pay | Admitting: Osteopathic Medicine

## 2015-12-30 DIAGNOSIS — I10 Essential (primary) hypertension: Secondary | ICD-10-CM

## 2015-12-31 ENCOUNTER — Other Ambulatory Visit: Payer: Self-pay | Admitting: Family Medicine

## 2016-01-22 ENCOUNTER — Other Ambulatory Visit: Payer: Self-pay | Admitting: Family Medicine

## 2016-01-22 ENCOUNTER — Other Ambulatory Visit: Payer: Self-pay | Admitting: Physician Assistant

## 2016-01-22 DIAGNOSIS — M797 Fibromyalgia: Secondary | ICD-10-CM

## 2016-02-03 ENCOUNTER — Other Ambulatory Visit: Payer: Self-pay | Admitting: Physician Assistant

## 2016-02-04 ENCOUNTER — Other Ambulatory Visit: Payer: Self-pay | Admitting: Physician Assistant

## 2016-02-04 ENCOUNTER — Telehealth: Payer: Self-pay

## 2016-02-04 DIAGNOSIS — Z1231 Encounter for screening mammogram for malignant neoplasm of breast: Secondary | ICD-10-CM

## 2016-02-04 NOTE — Telephone Encounter (Signed)
Patient called wanting to know why she needs to be seen to have traZODone (DESYREL) 50 MG tablet [[id number]] medication refilled please advise her number is [phone]

## 2016-02-05 ENCOUNTER — Ambulatory Visit (INDEPENDENT_AMBULATORY_CARE_PROVIDER_SITE_OTHER): Payer: BLUE CROSS/BLUE SHIELD | Admitting: Physician Assistant

## 2016-02-05 VITALS — BP 132/80 | HR 78 | Temp 98.1°F | Resp 16 | Ht 62.0 in | Wt 210.0 lb

## 2016-02-05 DIAGNOSIS — Z23 Encounter for immunization: Secondary | ICD-10-CM

## 2016-02-05 DIAGNOSIS — Z760 Encounter for issue of repeat prescription: Secondary | ICD-10-CM

## 2016-02-05 DIAGNOSIS — Z1231 Encounter for screening mammogram for malignant neoplasm of breast: Secondary | ICD-10-CM | POA: Diagnosis not present

## 2016-02-05 DIAGNOSIS — Z0000 Encounter for general adult medical examination without abnormal findings: Secondary | ICD-10-CM | POA: Diagnosis not present

## 2016-02-05 DIAGNOSIS — D649 Anemia, unspecified: Secondary | ICD-10-CM

## 2016-02-05 DIAGNOSIS — E876 Hypokalemia: Secondary | ICD-10-CM

## 2016-02-05 LAB — COMPLETE METABOLIC PANEL WITH GFR
ALT: 28 U/L (ref 6–29)
AST: 22 U/L (ref 10–35)
Albumin: 4 g/dL (ref 3.6–5.1)
Alkaline Phosphatase: 78 U/L (ref 33–130)
BUN: 21 mg/dL (ref 7–25)
CALCIUM: 8.9 mg/dL (ref 8.6–10.4)
CHLORIDE: 99 mmol/L (ref 98–110)
CO2: 31 mmol/L (ref 20–31)
Creat: 0.73 mg/dL (ref 0.50–0.99)
GFR, EST NON AFRICAN AMERICAN: 89 mL/min (ref >=60)
Glucose, Bld: 107 mg/dL — ABNORMAL HIGH (ref 65–99)
POTASSIUM: 3.2 mmol/L — AB (ref 3.5–5.3)
Sodium: 141 mmol/L (ref 135–146)
Total Bilirubin: 0.4 mg/dL (ref 0.2–1.2)
Total Protein: 6.5 g/dL (ref 6.1–8.1)

## 2016-02-05 LAB — LIPID PANEL
CHOLESTEROL: 164 mg/dL (ref 125–200)
HDL: 59 mg/dL (ref >=46)
LDL Cholesterol: 78 mg/dL (ref <130)
TRIGLYCERIDES: 137 mg/dL (ref <150)
Total CHOL/HDL Ratio: 2.8 Ratio (ref <=5.0)
VLDL: 27 mg/dL (ref <30)

## 2016-02-05 LAB — CBC
HCT: 35.7 % (ref 35.0–45.0)
HEMOGLOBIN: 10.8 g/dL — AB (ref 11.7–15.5)
MCH: 23.8 pg — ABNORMAL LOW (ref 27.0–33.0)
MCHC: 30.3 g/dL — AB (ref 32.0–36.0)
MCV: 78.8 fL — ABNORMAL LOW (ref 80.0–100.0)
MPV: 8.8 fL (ref 7.5–12.5)
Platelets: 365 10*3/uL (ref 140–400)
RBC: 4.53 MIL/uL (ref 3.80–5.10)
RDW: 17.5 % — ABNORMAL HIGH (ref 11.0–15.0)
WBC: 9.2 10*3/uL (ref 3.8–10.8)

## 2016-02-05 LAB — TSH: TSH: 0.58 m[IU]/L

## 2016-02-05 LAB — VITAMIN D 25 HYDROXY (VIT D DEFICIENCY, FRACTURES): VIT D 25 HYDROXY: 33 ng/mL (ref 30–100)

## 2016-02-05 MED ORDER — TRAZODONE HCL 100 MG PO TABS: 50.0000 mg | ORAL_TABLET | Freq: Every evening | ORAL | 3 refills | Status: DC | PRN

## 2016-02-05 NOTE — Patient Instructions (Signed)
     IF you received an x-ray today, you will receive an invoice from [facility]. Please contact [facility] at [phone] with questions or concerns regarding your invoice.   IF you received labwork today, you will receive an invoice from [facility]. Please contact [facility] at [phone] with questions or concerns regarding your invoice.   Our billing staff will not be able to assist you with questions regarding bills from these companies.  You will be contacted with the lab results as soon as they are available. The fastest way to get your results is to activate your My Chart account. Instructions are located on the last page of this paperwork. If you have not heard from us regarding the results in 2 weeks, please contact this office.      

## 2016-02-05 NOTE — Progress Notes (Addendum)
02/05/2016 8:55 AM   DOB: Jul 26, 1955 / MRN: [id number]  SUBJECTIVE:  [name] is a 60 y.o. female presenting for medication refill (Trazadone).  She denies depression and anhedonia. She has been using Trazodone for the past couple of years (2 tablets at night time).  Patient denies any increased sadness,loss of concentration or suicidal thoughts.  Patient denies any side effects and states that it helps her relax and sleep for about 7-8 hours.  Before taking Trazodone, patient had difficulty sleeping and had pain in her arms and hands.  She denies any palpitations, dry eyes/mouth. Patient is a IT consultant.    Patient would like a flu shot today.   Depression screen PHQ 2/9 02/05/2016  Decreased Interest 0  Down, Depressed, Hopeless 0  PHQ - 2 Score 0     She is allergic to hydrocodone.   She  has a past medical history of Anxiety; Arthritis; Chronic back pain; Diabetes mellitus without complication (HCC); GERD (gastroesophageal reflux disease); History of bronchitis; History of shingles; Hyperlipidemia; Hypertension; Insomnia; Joint pain; Joint swelling; Migraine; Muscle spasm of back; Obesity; Pneumonia; PONV (postoperative nausea and vomiting); Rheumatoid arthritis (HCC); Seasonal allergies; and Urinary urgency.    She  reports that she has quit smoking. Her smoking use included Cigarettes. She has never used smokeless tobacco. She reports that she does not drink alcohol or use drugs. She  has no sexual activity history on file. The patient  has a past surgical history that includes Back surgery; Abdominal hysterectomy (2003); Cholecystectomy (N/A, 06/27/2012); Carpal tunnel release (Bilateral); Neck surgery; Colonoscopy; Lumbar laminectomy/decompression microdiscectomy (Right, 04/23/2014); Lumbar wound debridement (N/A, 05/01/2014); and Lumbar wound debridement (N/A, 05/08/2014).  Her family history includes Cancer in her father; Colon cancer (age of onset: 55) in her paternal aunt; Diabetes in her  maternal grandmother; Heart disease in her maternal uncle, maternal uncle, mother, and paternal grandmother; Leukemia in her father; Rheum arthritis in her mother.  Review of Systems  Constitutional: Negative for chills and fever.  Eyes: Negative for pain, discharge and redness.  Respiratory: Negative for cough.   Cardiovascular: Negative for chest pain and palpitations.  Gastrointestinal: Negative for nausea and vomiting.  Neurological: Negative for headaches.    The problem list and medications were reviewed and updated by myself where necessary and exist elsewhere in the encounter.   OBJECTIVE:  BP 132/80 (BP Location: Right Arm, Patient Position: Sitting, Cuff Size: Normal)   Pulse 78   Temp 98.1 F (36.7 C) (Oral)   Resp 16   Ht 5\' 2"  (1.575 m)   Wt 210 lb (95.3 kg)   SpO2 96%   BMI 38.41 kg/m   Physical Exam  Constitutional: She is oriented to person, place, and time. Vital signs are normal.  Cardiovascular: Normal rate, regular rhythm and normal heart sounds.   No murmur heard. Pulmonary/Chest: Effort normal and breath sounds normal. No respiratory distress. She has no wheezes. She has no rales.  Neurological: She is alert and oriented to person, place, and time. No cranial nerve deficit.  Skin: Skin is warm.  Psychiatric: She has a normal mood and affect. Her behavior is normal. Judgment and thought content normal.  No SI, no HI    No results found for this or any previous visit (from the past 72 hour(s)).  No results found.  ASSESSMENT AND PLAN  [name] was seen today for medication refill and immunizations.  Diagnoses and all orders for this visit:  Flu vaccine need -  Flu Vaccine QUAD 36+ mos IM  Medication refill: Trazodone 100 mg qhs.  She is having no side effects (see HPI).   -     traZODone (DESYREL) 100 MG tablet; Take 0.5-1 tablets (50-100 mg total) by mouth at bedtime as needed for sleep.  Annual physical exam: These labs will be forwarded to  Dr. Lyn Hollingshead as we did not address her complete physical today.  This orders were placed previously.  Comments: Up-to-date except for mammogram and shingles vaccine. Not candidate for Pap due to hysterectomy for benign disease Orders: -     COMPLETE METABOLIC PANEL WITH GFR -     TSH -     CBC -     VITAMIN D 25 Hydroxy (Vit-D Deficiency, Fractures) -     Lipid panel  Visit for screening mammogram: See probem 3.  -     MM SCREENING BREAST TOMO BILATERAL    This note was scribed in my presence and I performed the services described in the this documentation.   The patient is advised to call or return to clinic if she does not see an improvement in symptoms, or to seek the care of the closest emergency department if she worsens with the above plan.   Deliah Boston, MHS, PA-C [facility] 02/05/2016 8:55 AM

## 2016-02-06 ENCOUNTER — Other Ambulatory Visit: Payer: Self-pay

## 2016-02-06 ENCOUNTER — Other Ambulatory Visit (INDEPENDENT_AMBULATORY_CARE_PROVIDER_SITE_OTHER): Payer: BLUE CROSS/BLUE SHIELD | Admitting: Physician Assistant

## 2016-02-06 DIAGNOSIS — E876 Hypokalemia: Secondary | ICD-10-CM

## 2016-02-06 DIAGNOSIS — D649 Anemia, unspecified: Secondary | ICD-10-CM | POA: Diagnosis not present

## 2016-02-06 LAB — FERRITIN: FERRITIN: 6 ng/mL — AB (ref 20–288)

## 2016-02-06 LAB — IRON AND TIBC
%SAT: 15 % (ref 11–50)
Iron: 64 ug/dL (ref 45–160)
TIBC: 426 ug/dL (ref 250–450)
UIBC: 362 ug/dL (ref 125–400)

## 2016-02-06 NOTE — Addendum Note (Signed)
Addended by: Deirdre Pippins on: 02/06/2016 12:14 PM   Modules accepted: Orders

## 2016-02-08 LAB — FOLATE RBC: RBC FOLATE: 1133 ng/mL (ref >280)

## 2016-02-09 LAB — PATHOLOGIST SMEAR REVIEW

## 2016-02-10 NOTE — Telephone Encounter (Signed)
Pt came in and was seen, and got her Rx refills then.

## 2016-02-12 ENCOUNTER — Other Ambulatory Visit: Payer: Self-pay | Admitting: Osteopathic Medicine

## 2016-02-12 DIAGNOSIS — I10 Essential (primary) hypertension: Secondary | ICD-10-CM

## 2016-02-16 ENCOUNTER — Ambulatory Visit (INDEPENDENT_AMBULATORY_CARE_PROVIDER_SITE_OTHER): Payer: BLUE CROSS/BLUE SHIELD | Admitting: Physician Assistant

## 2016-02-16 VITALS — BP 124/80 | HR 74 | Temp 98.1°F | Resp 16 | Ht 62.0 in | Wt 214.6 lb

## 2016-02-16 DIAGNOSIS — D649 Anemia, unspecified: Secondary | ICD-10-CM

## 2016-02-16 DIAGNOSIS — I10 Essential (primary) hypertension: Secondary | ICD-10-CM | POA: Diagnosis not present

## 2016-02-16 DIAGNOSIS — E876 Hypokalemia: Secondary | ICD-10-CM | POA: Diagnosis not present

## 2016-02-16 LAB — POCT CBC
GRANULOCYTE PERCENT: 64.9 % (ref 37–80)
HCT, POC: 31.3 % — AB (ref 37.7–47.9)
Hemoglobin: 10.4 g/dL — AB (ref 12.2–16.2)
LYMPH, POC: 2.5 (ref 0.6–3.4)
MCH, POC: 25.4 pg — AB (ref 27–31.2)
MCHC: 33.2 g/dL (ref 31.8–35.4)
MCV: 76.5 fL — AB (ref 80–97)
MID (cbc): 0.5 (ref 0–0.9)
MPV: 7.7 fL (ref 0–99.8)
POC Granulocyte: 5.5 (ref 2–6.9)
POC LYMPH %: 28.9 % (ref 10–50)
POC MID %: 6.2 %M (ref 0–12)
Platelet Count, POC: 359 10*3/uL (ref 142–424)
RBC: 4.1 M/uL (ref 4.04–5.48)
RDW, POC: 19.6 %
WBC: 8.5 10*3/uL (ref 4.6–10.2)

## 2016-02-16 LAB — BASIC METABOLIC PANEL
BUN: 17 mg/dL (ref 7–25)
CHLORIDE: 101 mmol/L (ref 98–110)
CO2: 30 mmol/L (ref 20–31)
Calcium: 8.9 mg/dL (ref 8.6–10.4)
Creat: 0.9 mg/dL (ref 0.50–0.99)
GLUCOSE: 105 mg/dL — AB (ref 65–99)
POTASSIUM: 3.7 mmol/L (ref 3.5–5.3)
SODIUM: 141 mmol/L (ref 135–146)

## 2016-02-16 MED ORDER — LOSARTAN POTASSIUM 50 MG PO TABS: 50.0000 mg | ORAL_TABLET | Freq: Every day | ORAL | 3 refills | Status: DC

## 2016-02-16 NOTE — Progress Notes (Signed)
By signing my name below, I, [name], attest that this documentation has been prepared under the direction and in the presence of Deliah Boston, PA-C.  Electronically Signed: Andrew Au, ED Scribe. 02/16/2016. 4:51 PM.  02/16/2016 4:51 PM   DOB: 05-15-1955 / MRN: [id number]  SUBJECTIVE: Chief Complaint  Patient presents with  . follow up blood work 02/06/16(see lab notes from 10/6)   [name] is a 60 y.o. female presenting for a follow up of abnormal labs. Pt has hx of RA. 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 She had a medication change is on leflunomide. Pt is followed [facility], Dr. Kathi Ludwig. States her rheumatologist wants to start iron transfusion. Pt reports symptoms of fatigue, however she feels relatively normal in relation to her baseline.    She is on losartan, HCTZ and trazodone 1 tablet at night.  She would like a refill of her Losartan today.   She is allergic to hydrocodone.   She  has a past medical history of Anxiety; Arthritis; Chronic back pain; Diabetes mellitus without complication (HCC); GERD (gastroesophageal reflux disease); History of bronchitis; History of shingles; Hyperlipidemia; Hypertension; Insomnia; Joint pain; Joint swelling; Migraine; Muscle spasm of back; Obesity; Pneumonia; PONV (postoperative nausea and vomiting); Rheumatoid arthritis (HCC); Seasonal allergies; and Urinary urgency.    She  reports that she has quit smoking. Her smoking use included Cigarettes. She has never used smokeless tobacco. She reports that she does not drink alcohol or use drugs. She  has no sexual activity history on file. The patient  has a past surgical history that includes Back surgery; Abdominal hysterectomy (2003); Cholecystectomy (N/A, 06/27/2012); Carpal tunnel release (Bilateral); Neck surgery; Colonoscopy; Lumbar laminectomy/decompression microdiscectomy (Right, 04/23/2014); Lumbar wound  debridement (N/A, 05/01/2014); and Lumbar wound debridement (N/A, 05/08/2014).  Her family history includes Cancer in her father; Colon cancer (age of onset: 67) in her paternal aunt; Diabetes in her maternal grandmother; Heart disease in her maternal uncle, maternal uncle, mother, and paternal grandmother; Leukemia in her father; Rheum arthritis in her mother.  Review of Systems  Constitutional: Positive for malaise/fatigue.  Skin: Negative for rash.    The problem list and medications were reviewed and updated by myself where necessary and exist elsewhere in the encounter.   OBJECTIVE:  BP 124/80 (BP Location: Right Arm, Patient Position: Sitting, Cuff Size: Normal)   Pulse 74   Temp 98.1 F (36.7 C) (Oral)   Resp 16   Ht 5\' 2"  (1.575 m)   Wt 214 lb 9.6 oz (97.3 kg)   SpO2 97%   BMI 39.25 kg/m   Physical Exam  Constitutional: She is oriented to person, place, and time. Vital signs are normal. She appears well-developed.  Cardiovascular: Normal rate.   Pulmonary/Chest: Effort normal. No respiratory distress.  Musculoskeletal: Normal range of motion.  Neurological: She is alert and oriented to person, place, and time.  Skin: Skin is warm and dry.  Psychiatric: She has a normal mood and affect. Her behavior is normal. Judgment and thought content normal.   Results for orders placed or performed in visit on 02/16/16  POCT CBC  Result Value Ref Range   WBC 8.5 4.6 - 10.2 K/uL   Lymph, poc 2.5 0.6 - 3.4   POC LYMPH PERCENT 28.9 10 - 50 %L   MID (cbc) 0.5 0 - 0.9   POC MID % 6.2 0 - 12 %M   POC Granulocyte  5.5 2 - 6.9   Granulocyte percent 64.9 37 - 80 %G   RBC 4.10 4.04 - 5.48 M/uL   Hemoglobin 10.4 (A) 12.2 - 16.2 g/dL   HCT, POC 38.1 (A) 82.9 - 47.9 %   MCV 76.5 (A) 80 - 97 fL   MCH, POC 25.4 (A) 27 - 31.2 pg   MCHC 33.2 31.8 - 35.4 g/dL   RDW, POC 93.7 %   Platelet Count, POC 359 142 - 424 K/uL   MPV 7.7 0 - 99.8 fL    ASSESSMENT AND PLAN  [name] was seen today for  follow up blood work 02/06/16(see lab notes from 10/6).  Diagnoses and all orders for this visit:  Low hemoglobin: There appears to be a miscommunication somewhere.  Her rheumatologist told her that she needs a iron transfusion, however her last hemoglobin was only 1 point low and her HCT was normal and today her CBC is overall stable. Her last ferritin was however low, but all of her other test have come back normal.  Question a possible anemia of chronic disease however her labs don't point in the direction of an iron transfusion in my opinion.  I will call Dr. Kathi Ludwig tomorrow to get some insight into the concern and will follow this problem from that point.  -     POCT CBC  Low blood potassium: Likely resolved with the addition of Losartan.  -     Basic metabolic panel     The patient is advised to call or return to clinic if she does not see an improvement in symptoms, or to seek the care of the closest emergency department if she worsens with the above plan.   Deliah Boston, MHS, PA-C [facility] 02/16/2016 4:51 PM

## 2016-02-16 NOTE — Patient Instructions (Signed)
     IF you received an x-ray today, you will receive an invoice from [facility]. Please contact [facility] at [phone] with questions or concerns regarding your invoice.   IF you received labwork today, you will receive an invoice from [facility]. Please contact [facility] at [phone] with questions or concerns regarding your invoice.   Our billing staff will not be able to assist you with questions regarding bills from these companies.  You will be contacted with the lab results as soon as they are available. The fastest way to get your results is to activate your My Chart account. Instructions are located on the last page of this paperwork. If you have not heard from us regarding the results in 2 weeks, please contact this office.      

## 2016-02-17 ENCOUNTER — Ambulatory Visit
Admission: RE | Admit: 2016-02-17 | Discharge: 2016-02-17 | Disposition: A | Discharge status: Home / Self Care | Payer: BLUE CROSS/BLUE SHIELD | Source: Ambulatory Visit | Attending: Physician Assistant | Admitting: Physician Assistant

## 2016-03-11 ENCOUNTER — Ambulatory Visit (INDEPENDENT_AMBULATORY_CARE_PROVIDER_SITE_OTHER): Payer: BLUE CROSS/BLUE SHIELD | Admitting: Family Medicine

## 2016-03-11 ENCOUNTER — Encounter: Payer: Self-pay | Admitting: Family Medicine

## 2016-03-11 VITALS — BP 126/84 | HR 77 | Ht 62.0 in | Wt 221.2 lb

## 2016-03-11 DIAGNOSIS — I00 Rheumatic fever without heart involvement: Secondary | ICD-10-CM | POA: Diagnosis not present

## 2016-03-11 DIAGNOSIS — E782 Mixed hyperlipidemia: Secondary | ICD-10-CM | POA: Diagnosis not present

## 2016-03-11 DIAGNOSIS — IMO0001 Reserved for inherently not codable concepts without codable children: Secondary | ICD-10-CM

## 2016-03-11 DIAGNOSIS — M797 Fibromyalgia: Secondary | ICD-10-CM

## 2016-03-11 DIAGNOSIS — I10 Essential (primary) hypertension: Secondary | ICD-10-CM

## 2016-03-11 DIAGNOSIS — K219 Gastro-esophageal reflux disease without esophagitis: Secondary | ICD-10-CM

## 2016-03-11 DIAGNOSIS — J3089 Other allergic rhinitis: Secondary | ICD-10-CM

## 2016-03-11 DIAGNOSIS — M0520 Rheumatoid vasculitis with rheumatoid arthritis of unspecified site: Secondary | ICD-10-CM | POA: Insufficient documentation

## 2016-03-11 DIAGNOSIS — G4700 Insomnia, unspecified: Secondary | ICD-10-CM

## 2016-03-11 DIAGNOSIS — E669 Obesity, unspecified: Secondary | ICD-10-CM | POA: Diagnosis not present

## 2016-03-11 DIAGNOSIS — D509 Iron deficiency anemia, unspecified: Secondary | ICD-10-CM

## 2016-03-11 DIAGNOSIS — Z6841 Body Mass Index (BMI) 40.0 and over, adult: Secondary | ICD-10-CM

## 2016-03-11 NOTE — Patient Instructions (Signed)
1) Recheck blood pressure in about 4-6 weeks or so at your convenience. We will decide on changing blood pressure to help with your low potassium levels and we will do screening for diabetes.  2) lose it or my fitness pal- track everything you eat/ drink - wt watchers    Please realize, EXERCISE IS MEDICINE!  -  American Heart Association [facility]( AHA) guidelines for exercise : If you are in good health, without any medical conditions, you should engage in 150 minutes of moderate intensity aerobic activity per week.  This means you should be huffing and puffing throughout your workout.   Engaging in regular exercise will improve brain function and memory, as well as improve mood, boost immune system and help with weight management.  As well as the other, more well-known effects of exercise such as decreasing blood sugar levels, decreasing blood pressure,  and decreasing bad cholesterol levels/ increasing good cholesterol levels.     -  The AHA strongly endorses consumption of a diet that contains a variety of foods from all the food categories with an emphasis on fruits and vegetables; fat-free and low-fat dairy products; cereal and grain products; legumes and nuts; and fish, poultry, and/or extra lean meats.    Excessive food intake, especially of foods high in saturated and trans fats, sugar, and salt, should be avoided.    Adequate water intake of roughly 1/2 of your weight in pounds, should equal the ounces of water per day you should drink.  So for instance, if you're 200 pounds, that would be 100 ounces of water per day.         Mediterranean Diet  Why follow it? Research shows. . Those who follow the Mediterranean diet have a reduced risk of heart disease  . The diet is associated with a reduced incidence of Parkinson's and Alzheimer's diseases . People following the diet may have longer life expectancies and lower rates of chronic diseases  . The Dietary Guidelines for Americans recommends  the Mediterranean diet as an eating plan to promote health and prevent disease  What Is the Mediterranean Diet?  . Healthy eating plan based on typical foods and recipes of Mediterranean-style cooking . The diet is primarily a plant based diet; these foods should make up a majority of meals   Starches - Plant based foods should make up a majority of meals - They are an important sources of vitamins, minerals, energy, antioxidants, and fiber - Choose whole grains, foods high in fiber and minimally processed items  - Typical grain sources include wheat, oats, barley, corn, brown rice, bulgar, farro, millet, polenta, couscous  - Various types of beans include chickpeas, lentils, fava beans, black beans, white beans   Fruits  Veggies - Large quantities of antioxidant rich fruits & veggies; 6 or more servings  - Vegetables can be eaten raw or lightly drizzled with oil and cooked  - Vegetables common to the traditional Mediterranean Diet include: artichokes, arugula, beets, broccoli, brussel sprouts, cabbage, carrots, celery, collard greens, cucumbers, eggplant, kale, leeks, lemons, lettuce, mushrooms, okra, onions, peas, peppers, potatoes, pumpkin, radishes, rutabaga, shallots, spinach, sweet potatoes, turnips, zucchini - Fruits common to the Mediterranean Diet include: apples, apricots, avocados, cherries, clementines, dates, figs, grapefruits, grapes, melons, nectarines, oranges, peaches, pears, pomegranates, strawberries, tangerines  Fats - Replace butter and margarine with healthy oils, such as olive oil, canola oil, and tahini  - Limit nuts to no more than a handful a day  - Nuts include walnuts,  almonds, pecans, pistachios, pine nuts  - Limit or avoid candied, honey roasted or heavily salted nuts - Olives are central to the Mediterranean diet - can be eaten whole or used in a variety of dishes   Meats Protein - Limiting red meat: no more than a few times a month - When eating red meat: choose  lean cuts and keep the portion to the size of deck of cards - Eggs: approx. 0 to 4 times a week  - Fish and lean poultry: at least 2 a week  - Healthy protein sources include, chicken, [place], lean beef, lamb - Increase intake of seafood such as tuna, salmon, trout, mackerel, shrimp, scallops - Avoid or limit high fat processed meats such as sausage and bacon  Dairy - Include moderate amounts of low fat dairy products  - Focus on healthy dairy such as fat free yogurt, skim milk, low or reduced fat cheese - Limit dairy products higher in fat such as whole or 2% milk, cheese, ice cream  Alcohol - Moderate amounts of red wine is ok  - No more than 5 oz daily for women (all ages) and men older than age 67  - No more than 10 oz of wine daily for men younger than 66  Other - Limit sweets and other desserts  - Use herbs and spices instead of salt to flavor foods  - Herbs and spices common to the traditional Mediterranean Diet include: basil, bay leaves, chives, cloves, cumin, fennel, garlic, lavender, marjoram, mint, oregano, parsley, pepper, rosemary, sage, savory, sumac, tarragon, thyme   It's not just a diet, it's a lifestyle:  . The Mediterranean diet includes lifestyle factors typical of those in the region  . Foods, drinks and meals are best eaten with others and savored . Daily physical activity is important for overall good health . This could be strenuous exercise like running and aerobics . This could also be more leisurely activities such as walking, housework, yard-work, or taking the stairs . Moderation is the key; a balanced and healthy diet accommodates most foods and drinks . Consider portion sizes and frequency of consumption of certain foods   Meal Ideas & Options:  . Breakfast:  o Whole wheat toast or whole wheat English muffins with peanut butter & hard boiled egg o Steel cut oats topped with apples & cinnamon and skim milk  o Fresh fruit: banana, strawberries, melon,  berries, peaches  o Smoothies: strawberries, bananas, greek yogurt, peanut butter o Low fat greek yogurt with blueberries and granola  o Egg white omelet with spinach and mushrooms o Breakfast couscous: whole wheat couscous, apricots, skim milk, cranberries  . Sandwiches:  o Hummus and grilled vegetables (peppers, zucchini, squash) on whole wheat bread   o Grilled chicken on whole wheat pita with lettuce, tomatoes, cucumbers or tzatziki  o Tuna salad on whole wheat bread: tuna salad made with greek yogurt, olives, red peppers, capers, green onions o Garlic rosemary lamb pita: lamb sauted with garlic, rosemary, salt & pepper; add lettuce, cucumber, greek yogurt to pita - flavor with lemon juice and black pepper  . Seafood:  o Mediterranean grilled salmon, seasoned with garlic, basil, parsley, lemon juice and black pepper o Shrimp, lemon, and spinach whole-grain pasta salad made with low fat greek yogurt  o Seared scallops with lemon orzo  o Seared tuna steaks seasoned salt, pepper, coriander topped with tomato mixture of olives, tomatoes, olive oil, minced garlic, parsley, green onions and cappers  .  Meats:  o Herbed greek chicken salad with kalamata olives, cucumber, feta  o Red bell peppers stuffed with spinach, bulgur, lean ground beef (or lentils) & topped with feta   o Kebabs: skewers of chicken, tomatoes, onions, zucchini, squash  o [place] burgers: made with red onions, mint, dill, lemon juice, feta cheese topped with roasted red peppers . Vegetarian o Cucumber salad: cucumbers, artichoke hearts, celery, red onion, feta cheese, tossed in olive oil & lemon juice  o Hummus and whole grain pita points with a greek salad (lettuce, tomato, feta, olives, cucumbers, red onion) o Lentil soup with celery, carrots made with vegetable broth, garlic, salt and pepper  o Tabouli salad: parsley, bulgur, mint, scallions, cucumbers, tomato, radishes, lemon juice, olive oil, salt and  pepper.   Guidelines for Losing Weight   We want weight loss that will last so you should lose 1-2 pounds a week.  THAT IS IT! Please pick THREE things a month to change. Once it is a habit check off the item. Then pick another three items off the list to become habits.  If you are already doing a habit on the list GREAT!  Cross that item off!  Don't drink your calories. Ie, alcohol, soda, fruit juice, and sweet tea.   Drink more water. Drink a glass when you feel hungry or before each meal.   Eat breakfast - Complex carb and protein (likeDannon light and fit yogurt, oatmeal, fruit, eggs, [place] bacon).  Measure your cereal.  Eat no more than one cup a day. (ie Kashi)  Eat an apple a day.  Add a vegetable a day.  Try a new vegetable a month.  Use Pam! Stop using oil or butter to cook.  Don't finish your plate or use smaller plates.  Share your dessert.  Eat sugar free Jello for dessert or frozen grapes.  Don't eat 2-3 hours before bed.  Switch to whole wheat bread, pasta, and brown rice.  Make healthier choices when you eat out. No fries!  Pick baked chicken, NOT fried.  Don't forget to SLOW DOWN when you eat. It is not going anywhere.   Take the stairs.  Park far away in the parking lot  Lift soup cans (or weights) for 10 minutes while watching TV.  Walk at work for 10 minutes during break.  Walk outside 1 time a week with your friend, kids, dog, or significant other.  Start a walking group at church.  Walk the mall as much as you can tolerate.   Keep a food diary.  Weigh yourself daily.  Walk for 15 minutes 3 days per week.  Cook at home more often and eat out less. If life happens and you go back to old habits, it is okay.  Just start over. You can do it!  If you experience chest pain, get short of breath, or tired during the exercise, please stop immediately and inform your doctor.    Before you even begin to attack a weight-loss plan, it pays to  remember this: You are not fat. You have fat. Losing weight isn't about blame or shame; it's simply another achievement to accomplish. Dieting is like any other skill-you have to buckle down and work at it. As long as you act in a smart, reasonable way, you'll ultimately get where you want to be. Here are some weight loss pearls for you.   1. It's Not a Diet. It's a Lifestyle Thinking of a diet as something you're on  and suffering through only for the short term doesn't work. To shed weight and keep it off, you need to make permanent changes to the way you eat. It's OK to indulge occasionally, of course, but if you cut calories temporarily and then revert to your old way of eating, you'll gain back the weight quicker than you can say yo-yo. Use it to lose it. Research shows that one of the best predictors of long-term weight loss is how many pounds you drop in the first month. For that reason, nutritionists often suggest being stricter for the first two weeks of your new eating strategy to build momentum. Cut out added sugar and alcohol and avoid unrefined carbs. After that, figure out how you can reincorporate them in a way that's healthy and maintainable.  2. There's a Right Way to Exercise Working out burns calories and fat and boosts your metabolism by building muscle. But those trying to lose weight are notorious for overestimating the number of calories they burn and underestimating the amount they take in. Unfortunately, your system is biologically programmed to hold on to extra pounds and that means when you start exercising, your body senses the deficit and ramps up its hunger signals. If you're not diligent, you'll eat everything you burn and then some. Use it, to lose it. Cardio gets all the exercise glory, but strength and interval training are the real heroes. They help you build lean muscle, which in turn increases your metabolism and calorie-burning ability 3. Don't Overreact to Mild  Hunger Some people have a hard time losing weight because of hunger anxiety. To them, being hungry is bad-something to be avoided at all costs-so they carry snacks with them and eat when they don't need to. Others eat because they're stressed out or bored. While you never want to get to the point of being ravenous (that's when bingeing is likely to happen), a hunger pang, a craving, or the fact that it's 3:00 p.m. should not send you racing for the vending machine or obsessing about the energy bar in your purse. Ideally, you should put off eating until your stomach is growling and it's difficult to concentrate.  Use it to lose it. When you feel the urge to eat, use the HALT method. Ask yourself, Am I really hungry? Or am I angry or anxious, lonely or bored, or tired? If you're still not certain, try the apple test. If you're truly hungry, an apple should seem delicious; if it doesn't, something else is going on. Or you can try drinking water and making yourself busy, if you are still hungry try a healthy snack.  4. Not All Calories Are Created Equal The mechanics of weight loss are pretty simple: Take in fewer calories than you use for energy. But the kind of food you eat makes all the difference. Processed food that's high in saturated fat and refined starch or sugar can cause inflammation that disrupts the hormone signals that tell your brain you're full. The result: You eat a lot more.  Use it to lose it. Clean up your diet. Swap in whole, unprocessed foods, including vegetables, lean protein, and healthy fats that will fill you up and give you the biggest nutritional bang for your calorie buck. In a few weeks, as your brain starts receiving regular hunger and fullness signals once again, you'll notice that you feel less hungry overall and naturally start cutting back on the amount you eat.  5. Protein, Produce, and Plant-Based Fats Are  Your Weight-Loss Trinity Here's why eating the three Ps regularly will  help you drop pounds. Protein fills you up. You need it to build lean muscle, which keeps your metabolism humming so that you can torch more fat. People in a weight-loss program who ate double the recommended daily allowance for protein (about 110 grams for a 150-pound woman) lost 70 percent of their weight from fat, while people who ate the RDA lost only about 40 percent, one study found. Produce is packed with filling fiber. "It's very difficult to consume too many calories if you're eating a lot of vegetables. Example: Three cups of broccoli is a lot of food, yet only 93 calories. (Fruit is another story. It can be easy to overeat and can contain a lot of calories from sugar, so be sure to monitor your intake.) Plant-based fats like olive oil and those in avocados and nuts are healthy and extra satiating.  Use it to lose it. Aim to incorporate each of the three Ps into every meal and snack. People who eat protein throughout the day are able to keep weight off, according to a study in the American Journal of Clinical Nutrition. In addition to meat, poultry and seafood, good sources are beans, lentils, eggs, tofu, and yogurt. As for fat, keep portion sizes in check by measuring out salad dressing, oil, and nut butters (shoot for one to two tablespoons). Finally, eat veggies or a little fruit at every meal. People who did that consumed 308 fewer calories but didn't feel any hungrier than when they didn't eat more produce.  7. How You Eat Is As Important As What You Eat In order for your brain to register that you're full, you need to focus on what you're eating. Sit down whenever you eat, preferably at a table. Turn off the TV or computer, put down your phone, and look at your food. Smell it. Chew slowly, and don't put another bite on your fork until you swallow. When women ate lunch this attentively, they consumed 30 percent less when snacking later than those who listened to an audiobook at lunchtime,  according to a study in the [place] Journal of Nutrition. 8. Weighing Yourself Really Works The scale provides the best evidence about whether your efforts are paying off. Seeing the numbers tick up or down or stagnate is motivation to keep going-or to rethink your approach. A 2015 study at [facility] found that daily weigh-ins helped people lose more weight, keep it off, and maintain that loss, even after two years. Use it to lose it. Step on the scale at the same time every day for the best results. If your weight shoots up several pounds from one weigh-in to the next, don't freak out. Eating a lot of salt the night before or having your period is the likely culprit. The number should return to normal in a day or two. It's a steady climb that you need to do something about. 9. Too Much Stress and Too Little Sleep Are Your Enemies When you're tired and frazzled, your body cranks up the production of cortisol, the stress hormone that can cause carb cravings. Not getting enough sleep also boosts your levels of ghrelin, a hormone associated with hunger, while suppressing leptin, a hormone that signals fullness and satiety. People on a diet who slept only five and a half hours a night for two weeks lost 55 percent less fat and were hungrier than those who slept eight and a half hours,  according to a study in the [place] [employer] Journal. Use it to lose it. Prioritize sleep, aiming for seven hours or more a night, which research shows helps lower stress. And make sure you're getting quality zzz's. If a snoring spouse or a fidgety cat wakes you up frequently throughout the night, you may end up getting the equivalent of just four hours of sleep, according to a study from [facility]. Keep pets out of the bedroom, and use a white-noise app to drown out snoring. 10. You Will Hit a plateau-And You Can Bust Through It As you slim down, your body releases much less leptin, the fullness  hormone.  If you're not strength training, start right now. Building muscle can raise your metabolism to help you overcome a plateau. To keep your body challenged and burning calories, incorporate new moves and more intense intervals into your workouts or add another sweat session to your weekly routine. Alternatively, cut an extra 100 calories or so a day from your diet. Now that you've lost weight, your body simply doesn't need as much fuel.      Since food equals calories, in order to lose weight you must either eat fewer calories, exercise more to burn off calories with activity, or both. Food that is not used to fuel the body is stored as fat. A major component of losing weight is to make smarter food choices. Here's how:  1)   Limit non-nutritious foods, such as: Sugar, honey, syrups and candy Pastries, donuts, pies, cakes and cookies Soft drinks, sweetened juices and alcoholic beverages  2)  Cut down on high-fat foods by: - Choosing poultry, fish or lean red meat - Choosing low-fat cooking methods, such as baking, broiling, steaming, grilling and boiling - Using low-fat or non-fat dairy products - Using vinaigrette, herbs, lemon or fat-free salad dressings - Avoiding fatty meats, such as bacon, sausage, franks, ribs and luncheon meats - Avoiding high-fat snacks like nuts, chips and chocolate - Avoiding fried foods - Using less butter, margarine, oil and mayonnaise - Avoiding high-fat gravies, cream sauces and cream-based soups  3) Eat a variety of foods, including: - Fruit and vegetables that are raw, steamed or baked - Whole grains, breads, cereal, rice and pasta - Dairy products, such as low-fat or non-fat milk or yogurt, low-fat cottage cheese and low-fat cheese - Protein-rich foods like chicken, [place], fish, lean meat and legumes, or beans  4) Change your eating habits by: - Eat three balanced meals a day to help control your hunger - Watch portion sizes and eat small  servings of a variety of foods - Choose low-calorie snacks - Eat only when you are hungry and stop when you are satisfied - Eat slowly and try not to perform other tasks while eating - Find other activities to distract you from food, such as walking, taking up a hobby or being involved in the community - Include regular exercise in your daily routine ( minimum of 20 min of moderate-intensity exercise at least 5 days/week)  - Find a support group, if necessary, for emotional support in your weight loss journey      Easy ways to cut 100 calories  1. Eat your eggs with hot sauce OR salsa instead of cheese.  Eggs are great for breakfast, but many people consider eggs and cheese to be BFFs. Instead of cheese-1 oz. of cheddar has 114 calories-top your eggs with hot sauce, which contains no calories and helps with satiety and  metabolism. Salsa is also a great option!!  2. Top your toast, waffles or pancakes with fresh berries instead of jelly or syrup. Half a cup of berries-fresh, frozen or thawed-has about 40 calories, compared with 2 tbsp. of maple syrup or jelly, which both have about 100 calories. The berries will also give you a good punch of fiber, which helps keep you full and satisfied and won't spike blood sugar quickly like the jelly or syrup. 3. Swap the non-fat latte for black coffee with a splash of half-and-half. Contrary to its name, that non-fat latte has 130 calories and a startling 19g of carbohydrates per 16 oz. serving. Replacing that 'light' drinkable dessert with a black coffee with a splash of half-and-half saves you more than 100 calories per 16 oz. serving. 4. Sprinkle salads with freeze-dried raspberries instead of dried cranberries. If you want a sweet addition to your nutritious salad, stay away from dried cranberries. They have a whopping 130 calories per  cup and 30g carbohydrates. Instead, sprinkle freeze-dried raspberries guilt-free and save more than 100 calories per   cup serving, adding 3g of belly-filling fiber. 5. Go for mustard in place of mayo on your sandwich. Mustard can add really nice flavor to any sandwich, and there are tons of varieties, from spicy to honey. A serving of mayo is 95 calories, versus 10 calories in a serving of mustard.  Or try an avocado mayo spread: You can find the recipe few click this link: https://www.californiaavocado.com/recipes/recipe-container/[place]-avocado-mayo 6. Choose a DIY salad dressing instead of the store-bought kind. Mix Dijon or whole grain mustard with low-fat Kefir or red wine vinegar and garlic. 7. Use hummus as a spread instead of a dip. Use hummus as a spread on a high-fiber cracker or tortilla with a sandwich and save on calories without sacrificing taste. 8. Pick just one salad "accessory." Salad isn't automatically a calorie winner. It's easy to over-accessorize with toppings. Instead of topping your salad with nuts, avocado and cranberries (all three will clock in at 313 calories), just pick one. The next day, choose a different accessory, which will also keep your salad interesting. You don't wear all your jewelry every day, right? 9. Ditch the white pasta in favor of spaghetti squash. One cup of cooked spaghetti squash has about 40 calories, compared with traditional spaghetti, which comes with more than 200. Spaghetti squash is also nutrient-dense. It's a good source of fiber and Vitamins A and C, and it can be eaten just like you would eat pasta-with a great tomato sauce and [place] meatballs or with pesto, tofu and spinach, for example. 10. Dress up your chili, soups and stews with non-fat [place] yogurt instead of sour cream. Just a 'dollop' of sour cream can set you back 115 calories and a whopping 12g of fat-seven of which are of the artery-clogging variety. Added bonus: [place] yogurt is packed with muscle-building protein, calcium and B Vitamins. 11. Mash cauliflower instead of mashed potatoes. One  cup of traditional mashed potatoes-in all their creamy goodness-has more than 200 calories, compared to mashed cauliflower, which you can typically eat for less than 100 calories per 1 cup serving. Cauliflower is a great source of the antioxidant indole-3-carbinol (I3C), which may help reduce the risk of some cancers, like breast cancer. 12. Ditch the ice cream sundae in favor of a [place] yogurt parfait. Instead of a cup of ice cream or fro-yo for dessert, try 1 cup of nonfat Greek yogurt topped with fresh berries and a sprinkle  of cacao nibs. Both toppings are packed with antioxidants, which can help reduce cellular inflammation and oxidative damage. And the comparison is a no-brainer: One cup of ice cream has about 275 calories; one cup of frozen yogurt has about 230; and a cup of Greek yogurt has just 130, plus twice the protein, so you're less likely to return to the freezer for a second helping. 13. Put olive oil in a spray container instead of using it directly from the bottle. Each tablespoon of olive oil is 120 calories and 15g of fat. Use a mister instead of pouring it straight into the pan or onto a salad. This allows for portion control and will save you more than 100 calories. 14. When baking, substitute canned pumpkin for butter or oil. Canned pumpkin-not pumpkin pie mix-is loaded with Vitamin A, which is important for skin and eye health, as well as immunity. And the comparisons are pretty crazy:  cup of canned pumpkin has about 40 calories, compared to butter or oil, which has more than 800 calories. Yes, 800 calories. Applesauce and mashed banana can also serve as good substitutions for butter or oil, usually in a 1:1 ratio. 15. Top casseroles with high-fiber cereal instead of breadcrumbs. Breadcrumbs are typically made with white bread, while breakfast cereals contain 5-9g of fiber per serving. Not only will you save more than 150 calories per  cup serving, the swap will also keep you more  full and you'll get a metabolism boost from the added fiber. 16. Snack on pistachios instead of macadamia nuts. Believe it or not, you get the same amount of calories from 35 pistachios (100 calories) as you would from only five macadamia nuts. 17. Chow down on kale chips rather than potato chips. This is my favorite 'don't knock it 'till you try it' swap. Kale chips are so easy to make at home, and you can spice them up with a little grated parmesan or chili powder. Plus, they're a mere fraction of the calories of potato chips, but with the same crunch factor we crave so often. 18. Add seltzer and some fruit slices to your cocktail instead of soda or fruit juice. One cup of soda or fruit juice can pack on as much as 140 calories. Instead, use seltzer and fruit slices. The fruit provides valuable phytochemicals, such as flavonoids and anthocyanins, which help to combat cancer and stave off the aging process.

## 2016-03-11 NOTE — Progress Notes (Signed)
New patient office visit note:  Impression and Recommendations:    1. Mixed hyperlipidemia   2. Rheumatoid arteritis   3. Fibromyalgia   4. Class 3 obesity with serious comorbidity and body mass index (BMI) of 40.0 to 44.9 in adult, unspecified obesity type (HCC)   5. Essential hypertension   6. Insomnia, unspecified type   7. Gastroesophageal reflux disease, esophagitis presence not specified   8. Environmental and seasonal allergies   9. Iron deficiency anemia, unspecified iron deficiency anemia type     HTN (hypertension) Controlled essentially.  Lifestyle changes such as dash diet and engaging in a regular exercise program discussed with patient.  Educational handouts provided  Ambulatory BP monitoring encouraged. Keep log and bring in next OV  Continue current medication(s).   Also, risks and benefits of medications discussed with patient, including alternative treatments.   Encouraged patient to read drug information handouts to further educate self about the medicine prior to starting it.   Contact us prior with any Q's/ concerns.  Mixed hyperlipidemia On lipitor  Dietary changes such as low saturated & trans fat and low carb/ ketogenic diets discussed with patient.  Encouraged regular exercise and weight loss when appropriate.   Educational handouts provided at patient's desire.  Continue current medication(s).   Also, risks and benefits of medications discussed with patient, including alternative treatments.   Encouraged patient to read drug information handouts to further educate self about the medicine prior to starting it.   Contact us prior with any Q's/ concerns.  Obesity Counseling done- advised wt loss.  Use lose it or my fitness pal---> track all food and drinks.   Discussed with patient importance of weight loss to help achieve health goals and how increasing weight, correlates to increasing risk of disease. (or increasing risk of not  controlling existing diseases.)  Rheumatoid arteritis txed by Dr Kathi LudwigSyed- Rheum  Fibromyalgia Txed by Dr Kathi LudwigSyed; cymbalta  Insomnia On trazodone with decent control  Gastroesophageal reflux disease prilosec QD; good control  Environmental and seasonal allergies claritin daily, good control currently  Iron (Fe) deficiency anemia Cont Fe supp daily     No orders of the defined types were placed in this encounter.     New Prescriptions   No medications on file    Modified Medications   No medications on file    Discontinued Medications   ATORVASTATIN (LIPITOR) 20 MG TABLET    TAKE 1 TABLET BY MOUTH EVERY EVENING.   HYDROCHLOROTHIAZIDE (HYDRODIURIL) 25 MG TABLET    TAKE 1 TABLET BY MOUTH DAILY.   LEFLUNOMIDE (ARAVA) 10 MG TABLET    Take 10 mg by mouth daily.    Patient's Medications  New Prescriptions   No medications on file  Previous Medications   ASPIRIN EC 81 MG TABLET    Take 1 tablet (81 mg total) by mouth daily. For health heallth   ATORVASTATIN (LIPITOR) 20 MG TABLET    Take 1 tablet (20 mg total) by mouth every evening. For High cholesterol/fat   DULOXETINE (CYMBALTA) 30 MG CAPSULE    TAKE 1 CAPSULE BY MOUTH 2 TIMES DAILY FOR DEPRESSION   FERROUS SULFATE 325 (65 FE) MG TABLET    Take 700 mg by mouth daily with breakfast.   GABAPENTIN (NEURONTIN) 300 MG CAPSULE    Take 1 capsule by mouth 2 (two) times daily.   HYDROCHLOROTHIAZIDE (HYDRODIURIL) 25 MG TABLET    Take 1 tablet (25 mg total) by  mouth daily. For high blood pressure   LEFLUNOMIDE (ARAVA) 20 MG TABLET    Take 1 tablet by mouth daily.   LORATADINE (CLARITIN) 10 MG TABLET    TAKE 1 TABLET BY MOUTH DAILY.   LOSARTAN (COZAAR) 50 MG TABLET    Take 1 tablet (50 mg total) by mouth daily.   MULTIPLE VITAMIN (MULTIVITAMIN) TABLET    Take 1 tablet by mouth daily.   NAPROXEN SODIUM (ANAPROX) 220 MG TABLET    Take 220 mg by mouth 2 (two) times daily with a meal.   OMEPRAZOLE (PRILOSEC) 20 MG CAPSULE    Take 1  capsule (20 mg total) by mouth daily.   PREDNISONE (DELTASONE) 10 MG TABLET    Take 1 tablet by mouth daily.   TRAMADOL (ULTRAM) 50 MG TABLET    Take 1 tablet by mouth as needed.   TRAZODONE (DESYREL) 100 MG TABLET    Take 0.5-1 tablets (50-100 mg total) by mouth at bedtime as needed for sleep.   VITAMIN C (ASCORBIC ACID) 500 MG TABLET    Take 500 mg by mouth daily.  Modified Medications   No medications on file  Discontinued Medications   ATORVASTATIN (LIPITOR) 20 MG TABLET    TAKE 1 TABLET BY MOUTH EVERY EVENING.   HYDROCHLOROTHIAZIDE (HYDRODIURIL) 25 MG TABLET    TAKE 1 TABLET BY MOUTH DAILY.   LEFLUNOMIDE (ARAVA) 10 MG TABLET    Take 10 mg by mouth daily.    Return for 1-2 mo: , Follow-up of current medical issues.  The patient was counseled, risk factors were discussed, anticipatory guidance given.  Gross side effects, risk and benefits, and alternatives of medications discussed with patient.  Patient is aware that all medications have potential side effects and we are unable to predict every side effect or drug-drug interaction that may occur.  Expresses verbal understanding and consents to current therapy plan and treatment regimen.  Please see AVS handed out to patient at the end of our visit for further patient instructions/ counseling done pertaining to today's office visit.    Note: This document was prepared using Dragon voice recognition software and may include unintentional dictation errors.  ----------------------------------------------------------------------------------------------------------------------    Subjective:    Chief Complaint  Patient presents with  . Establish Care    HPI: [name] is a pleasant 60 y.o. female who presents to [facility] today to review their medical history with me and establish care.   I asked the patient to review their chronic problem list with me to ensure everything was updated and accurate.      [address]- Prior PCP--> been there 23yrs.   - primary concern is her wt---> gained 50lbs in past 3 yrs- due to FM dx, med changes etc.  -  CPE- end OCt/ Nov 2017---> did it at [place] and end Oct- Mammo  Smoked 10 yrs but only 2 pcks per week; quit 1998  -  SX:  Had "tummy tuck" in 3/17 is her only sx she has ever had.    - works for self- [employer].  Lives with friend and brother.    Patient Care Team    Relationship Specialty Notifications Start End  Thomasene Lot, DO PCP - General Family Medicine  03/11/16   Hilda Lias, MD Consulting Physician Neurosurgery  03/11/16   Rossie Muskrat, MD Consulting Physician Rheumatology  03/11/16      Wt Readings from Last 3 Encounters:  03/11/16 221 lb  3.2 oz (100.3 kg)  02/16/16 214 lb 9.6 oz (97.3 kg)  02/05/16 210 lb (95.3 kg)   BP Readings from Last 3 Encounters:  03/11/16 126/84  02/16/16 124/80  02/05/16 132/80   Pulse Readings from Last 3 Encounters:  03/11/16 77  02/16/16 74  02/05/16 78   BMI Readings from Last 3 Encounters:  03/11/16 40.46 kg/m  02/16/16 39.25 kg/m  02/05/16 38.41 kg/m   No results found for: HGBA1C  Patient Active Problem List   Diagnosis Date Noted  . Obesity 03/11/2016    Priority: High  . Mixed hyperlipidemia 03/11/2016    Priority: High  . HTN (hypertension) 06/16/2012    Priority: High  . Gastroesophageal reflux disease 03/14/2016  . Environmental and seasonal allergies 03/14/2016  . Iron (Fe) deficiency anemia 03/14/2016  . 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 Annual physical exam 11/08/2015  . Major depressive disorder, single episode, severe without psychotic features (HCC)   . 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 Fibromyalgia 02/19/2014  . Hypokalemia 09/18/2013  . Family history of ischemic heart disease   . Episode of dizziness after NTG while walking to [place] 09/17/2013  . Inflammatory arthritis 09/12/2013  . Elevated cholesterol 04/11/2013  . Generalized  anxiety disorder 08/23/2012  . Insomnia 08/14/2012  . Migraine, unspecified, without mention of intractable migraine without mention of status migrainosus 06/16/2012     Past Medical History:  Diagnosis Date  . Allergy   . Anxiety    takes Citalopram daily  . Arthritis   . Chronic back pain    radiculopathy  . Fibromyalgia   . GERD (gastroesophageal reflux disease)    takes Omeprazole daily  . History of bronchitis    last yr  . History of shingles   . Hyperlipidemia    takes Atorvastatin daily  . Hypertension    takes HCTZ daily as well as Propranolol  . Insomnia    takes trazodone nightly  . Joint pain   . Joint swelling   . Migraine    hx of  . Muscle spasm of back    takes Flexeril daily as needed  . Obesity   . Pneumonia    hx of-many,many yrs ago  . PONV (postoperative nausea and vomiting)    violent nausea/vomiting  . Rheumatoid arthritis (HCC)   . Seasonal allergies    takes Claritin daily  . Urinary urgency      Past Surgical History:  Procedure Laterality Date  . ABDOMINAL HYSTERECTOMY  2003  . ABDOMINOPLASTY  07/2015  . BACK SURGERY    . CARPAL TUNNEL RELEASE Bilateral   . CHOLECYSTECTOMY N/A 06/27/2012   Procedure: LAPAROSCOPIC CHOLECYSTECTOMY WITH INTRAOPERATIVE CHOLANGIOGRAM;  Surgeon: Currie Paris, MD;  Location: [facility];  Service: General;  Laterality: N/A;  . COLONOSCOPY    . LUMBAR LAMINECTOMY/DECOMPRESSION MICRODISCECTOMY Right 04/23/2014   Procedure: Right L5-S1 Laminectomy/Foraminotomy;  Surgeon: Karn Cassis, MD;  Location: [facility];  Service: Neurosurgery;  Laterality: Right;  Right L5-S1 Laminectomy/Foraminotomy  . LUMBAR WOUND DEBRIDEMENT N/A 05/01/2014   Procedure: EXPLORATION OF LUMBAR WOUND;  Surgeon: Karn Cassis, MD;  Location: [facility];  Service: Neurosurgery;  Laterality: N/A;  EXPLORATION OF LUMBAR WOUND  . LUMBAR WOUND DEBRIDEMENT N/A 05/08/2014   Procedure: LUMBAR WOUND Exploration;  Surgeon: Karn Cassis, MD;  Location: [facility];  Service: Neurosurgery;  Laterality: N/A;  . NECK SURGERY       Family History  Problem Relation Age of  Onset  . Leukemia Father   . Cancer Father     leukemia  . Diabetes Maternal Grandmother   . Rheum arthritis Maternal Grandmother   . Heart disease Paternal Grandmother   . Rheum arthritis Mother   . Heart disease Mother     2 coronary stents  . Heart disease Maternal Uncle   . Miscarriages / Stillbirths Maternal Uncle   . Heart disease Maternal Uncle   . Colon cancer Paternal Aunt 68  . Heart disease Maternal Aunt   . Heart disease Maternal Aunt      History  Drug Use No    History  Alcohol Use No    History  Smoking Status  . Former Smoker  . Packs/day: 0.50  . Years: 10.00  . Types: Cigarettes  . Quit date: 05/03/1996  Smokeless Tobacco  . Never Used    Comment: quit smoking 47yrs ago    Patient's Medications  New Prescriptions   No medications on file  Previous Medications   ASPIRIN EC 81 MG TABLET    Take 1 tablet (81 mg total) by mouth daily. For health heallth   ATORVASTATIN (LIPITOR) 20 MG TABLET    Take 1 tablet (20 mg total) by mouth every evening. For High cholesterol/fat   DULOXETINE (CYMBALTA) 30 MG CAPSULE    TAKE 1 CAPSULE BY MOUTH 2 TIMES DAILY FOR DEPRESSION   FERROUS SULFATE 325 (65 FE) MG TABLET    Take 700 mg by mouth daily with breakfast.   GABAPENTIN (NEURONTIN) 300 MG CAPSULE    Take 1 capsule by mouth 2 (two) times daily.   HYDROCHLOROTHIAZIDE (HYDRODIURIL) 25 MG TABLET    Take 1 tablet (25 mg total) by mouth daily. For high blood pressure   LEFLUNOMIDE (ARAVA) 20 MG TABLET    Take 1 tablet by mouth daily.   LORATADINE (CLARITIN) 10 MG TABLET    TAKE 1 TABLET BY MOUTH DAILY.   LOSARTAN (COZAAR) 50 MG TABLET    Take 1 tablet (50 mg total) by mouth daily.   MULTIPLE VITAMIN (MULTIVITAMIN) TABLET    Take 1 tablet by mouth daily.   NAPROXEN SODIUM (ANAPROX) 220 MG TABLET    Take 220 mg by mouth 2 (two)  times daily with a meal.   OMEPRAZOLE (PRILOSEC) 20 MG CAPSULE    Take 1 capsule (20 mg total) by mouth daily.   PREDNISONE (DELTASONE) 10 MG TABLET    Take 1 tablet by mouth daily.   TRAMADOL (ULTRAM) 50 MG TABLET    Take 1 tablet by mouth as needed.   TRAZODONE (DESYREL) 100 MG TABLET    Take 0.5-1 tablets (50-100 mg total) by mouth at bedtime as needed for sleep.   VITAMIN C (ASCORBIC ACID) 500 MG TABLET    Take 500 mg by mouth daily.  Modified Medications   No medications on file  Discontinued Medications   ATORVASTATIN (LIPITOR) 20 MG TABLET    TAKE 1 TABLET BY MOUTH EVERY EVENING.   HYDROCHLOROTHIAZIDE (HYDRODIURIL) 25 MG TABLET    TAKE 1 TABLET BY MOUTH DAILY.   LEFLUNOMIDE (ARAVA) 10 MG TABLET    Take 10 mg by mouth daily.    Allergies: Hydrocodone  Review of Systems  Constitutional: Negative.  Negative for chills, diaphoresis, fever, malaise/fatigue and weight loss.  HENT: Negative.  Negative for congestion, sore throat and tinnitus.   Eyes: Negative.  Negative for blurred vision, double vision and photophobia.  Respiratory: Negative.  Negative for cough and wheezing.  Cardiovascular: Negative.  Negative for chest pain and palpitations.  Gastrointestinal: Negative.  Negative for blood in stool, diarrhea, nausea and vomiting.  Genitourinary: Negative.  Negative for dysuria, frequency and urgency.  Musculoskeletal: Negative.  Negative for joint pain and myalgias.  Skin: Negative.  Negative for itching and rash.  Neurological: Negative.  Negative for dizziness, focal weakness, weakness and headaches.  Endo/Heme/Allergies: Negative.  Negative for environmental allergies and polydipsia. Does not bruise/bleed easily.  Psychiatric/Behavioral: Negative.  Negative for depression and memory loss. The patient is not nervous/anxious and does not have insomnia.      Objective:   Blood pressure 126/84, pulse 77, height 5\' 2"  (1.575 m), weight 221 lb 3.2 oz (100.3 kg). Body mass index  is 40.46 kg/m. General: Well Developed, well nourished, and in no acute distress.  Neuro: Alert and oriented x3, extra-ocular muscles intact, sensation grossly intact.  HEENT: Normocephalic, atraumatic, pupils equal round reactive to light, neck supple Skin: no gross suspicious lesions or rashes  Cardiac: Regular rate and rhythm, no murmurs rubs or gallops.  Respiratory: Essentially clear to auscultation bilaterally. Not using accessory muscles, speaking in full sentences.  Abdominal: Soft, not grossly distended Musculoskeletal: Ambulates w/o diff, FROM * 4 ext.  Vasc: less 2 sec cap RF, warm and pink  Psych:  No HI/SI, judgement and insight good, Euthymic mood. Full Affect.   Recent Results (from the past 2160 hour(s))  COMPLETE METABOLIC PANEL WITH GFR     Status: Abnormal   Collection Time: 02/05/16  9:09 AM  Result Value Ref Range   Sodium 141 135 - 146 mmol/L   Potassium 3.2 (L) 3.5 - 5.3 mmol/L   Chloride 99 98 - 110 mmol/L   CO2 31 20 - 31 mmol/L   Glucose, Bld 107 (H) 65 - 99 mg/dL   BUN 21 7 - 25 mg/dL   Creat 1.61 0.96 - 0.45 mg/dL    Comment:   For patients > or = 62 years of age: The upper reference limit for Creatinine is approximately 13% higher for people identified as African-American.      Total Bilirubin 0.4 0.2 - 1.2 mg/dL   Alkaline Phosphatase 78 33 - 130 U/L   AST 22 10 - 35 U/L   ALT 28 6 - 29 U/L   Total Protein 6.5 6.1 - 8.1 g/dL   Albumin 4.0 3.6 - 5.1 g/dL   Calcium 8.9 8.6 - 40.9 mg/dL   GFR, Est African American >89 >=60 mL/min   GFR, Est Non African American 89 >=60 mL/min  TSH     Status: None   Collection Time: 02/05/16  9:10 AM  Result Value Ref Range   TSH 0.58 mIU/L    Comment:   Reference Range   > or = 20 Years  0.40-4.50   Pregnancy Range First trimester  0.26-2.66 Second trimester 0.55-2.73 Third trimester  0.43-2.91     CBC     Status: Abnormal   Collection Time: 02/05/16  9:10 AM  Result Value Ref Range   WBC 9.2 3.8 -  10.8 K/uL   RBC 4.53 3.80 - 5.10 MIL/uL   Hemoglobin 10.8 (L) 11.7 - 15.5 g/dL   HCT 81.1 91.4 - 78.2 %   MCV 78.8 (L) 80.0 - 100.0 fL   MCH 23.8 (L) 27.0 - 33.0 pg   MCHC 30.3 (L) 32.0 - 36.0 g/dL   RDW 95.6 (H) 21.3 - 08.6 %   Platelets 365 140 - 400 K/uL   MPV  8.8 7.5 - 12.5 fL  VITAMIN D 25 Hydroxy (Vit-D Deficiency, Fractures)     Status: None   Collection Time: 02/05/16  9:10 AM  Result Value Ref Range   Vit D, 25-Hydroxy 33 30 - 100 ng/mL    Comment: Vitamin D Status           25-OH Vitamin D        Deficiency                <20 ng/mL        Insufficiency         20 - 29 ng/mL        Optimal             > or = 30 ng/mL   For 25-OH Vitamin D testing on patients on D2-supplementation and patients for whom quantitation of D2 and D3 fractions is required, the [facility] 25-OH VIT D, (D2,D3), LC/MS/MS is recommended: order code 93810 (patients > 2 yrs).   Lipid panel     Status: None   Collection Time: 02/05/16  9:10 AM  Result Value Ref Range   Cholesterol 164 125 - 200 mg/dL   Triglycerides 175 <102 mg/dL   HDL 59 >=58 mg/dL   Total CHOL/HDL Ratio 2.8 <=5.0 Ratio   VLDL 27 <30 mg/dL   LDL Cholesterol 78 <527 mg/dL    Comment:   Total Cholesterol/HDL Ratio:CHD Risk                        Coronary Heart Disease Risk Table                                        Men       Women          1/2 Average Risk              3.4        3.3              Average Risk              5.0        4.4           2X Average Risk              9.6        7.1           3X Average Risk             23.4       11.0 Use the calculated Patient Ratio above and the CHD Risk table  to determine the patient's CHD Risk.   Ferritin     Status: Abnormal   Collection Time: 02/06/16  2:45 PM  Result Value Ref Range   Ferritin 6 (L) 20 - 288 ng/mL  Iron and TIBC     Status: None   Collection Time: 02/06/16  2:45 PM  Result Value Ref Range   Iron 64 45 - 160 ug/dL   UIBC 782 423 - 536 ug/dL   TIBC  144 315 - 400 ug/dL   %SAT 15 11 - 50 %  Pathologist smear review     Status: None   Collection Time: 02/06/16  2:45 PM  Result Value Ref Range   Path Review SEE NOTE     Comment: Myeloid population  consists predominantly of mature segmented neutrophils.  No immature cells are identified.  RBC's appear to be microcytic and hypochromic on smear review.  Suggest evaluation for iron deficiency, if clinically indicated.   The overall findings in this smear are consistent with a reactive thrombocytosis.  Clinical correlation is recommended. Reviewed by Nehemiah Massed Mammarappallil MD (Electronic Signature on File) 02/09/2016.   Folate RBC     Status: None   Collection Time: 02/06/16  2:49 PM  Result Value Ref Range   RBC Folate 1,133 >280 ng/mL    Comment: Reference range not established for pediatric patients.  Basic metabolic panel     Status: Abnormal   Collection Time: 02/16/16  4:26 PM  Result Value Ref Range   Sodium 141 135 - 146 mmol/L   Potassium 3.7 3.5 - 5.3 mmol/L   Chloride 101 98 - 110 mmol/L   CO2 30 20 - 31 mmol/L   Glucose, Bld 105 (H) 65 - 99 mg/dL   BUN 17 7 - 25 mg/dL   Creat 6.96 2.95 - 2.84 mg/dL    Comment:   For patients > or = 62 years of age: The upper reference limit for Creatinine is approximately 13% higher for people identified as African-American.      Calcium 8.9 8.6 - 10.4 mg/dL  POCT CBC     Status: Abnormal   Collection Time: 02/16/16  4:38 PM  Result Value Ref Range   WBC 8.5 4.6 - 10.2 K/uL   Lymph, poc 2.5 0.6 - 3.4   POC LYMPH PERCENT 28.9 10 - 50 %L   MID (cbc) 0.5 0 - 0.9   POC MID % 6.2 0 - 12 %M   POC Granulocyte 5.5 2 - 6.9   Granulocyte percent 64.9 37 - 80 %G   RBC 4.10 4.04 - 5.48 M/uL   Hemoglobin 10.4 (A) 12.2 - 16.2 g/dL   HCT, POC 13.2 (A) 44.0 - 47.9 %   MCV 76.5 (A) 80 - 97 fL   MCH, POC 25.4 (A) 27 - 31.2 pg   MCHC 33.2 31.8 - 35.4 g/dL   RDW, POC 10.2 %   Platelet Count, POC 359 142 - 424 K/uL   MPV 7.7 0 - 99.8 fL

## 2016-03-14 DIAGNOSIS — K219 Gastro-esophageal reflux disease without esophagitis: Secondary | ICD-10-CM | POA: Insufficient documentation

## 2016-03-14 DIAGNOSIS — D509 Iron deficiency anemia, unspecified: Secondary | ICD-10-CM | POA: Insufficient documentation

## 2016-03-14 DIAGNOSIS — J3089 Other allergic rhinitis: Secondary | ICD-10-CM | POA: Insufficient documentation

## 2016-03-14 NOTE — Assessment & Plan Note (Signed)
Controlled essentially.  Lifestyle changes such as dash diet and engaging in a regular exercise program discussed with patient.  Educational handouts provided  Ambulatory BP monitoring encouraged. Keep log and bring in next OV  Continue current medication(s).   Also, risks and benefits of medications discussed with patient, including alternative treatments.   Encouraged patient to read drug information handouts to further educate self about the medicine prior to starting it.   Contact us prior with any Q's/ concerns.

## 2016-03-14 NOTE — Assessment & Plan Note (Addendum)
Txed by Dr Kathi Ludwig; cymbalta

## 2016-03-14 NOTE — Assessment & Plan Note (Signed)
prilosec QD; good control

## 2016-03-14 NOTE — Assessment & Plan Note (Signed)
claritin daily, good control currently

## 2016-03-14 NOTE — Assessment & Plan Note (Signed)
On trazodone with decent control

## 2016-03-14 NOTE — Assessment & Plan Note (Signed)
Counseling done- advised wt loss.  Use lose it or my fitness pal---> track all food and drinks.   Discussed with patient importance of weight loss to help achieve health goals and how increasing weight, correlates to increasing risk of disease. (or increasing risk of not controlling existing diseases.) 

## 2016-03-14 NOTE — Assessment & Plan Note (Signed)
txed by Dr Kathi Ludwig- Rheum

## 2016-03-14 NOTE — Assessment & Plan Note (Signed)
On lipitor  Dietary changes such as low saturated & trans fat and low carb/ ketogenic diets discussed with patient.  Encouraged regular exercise and weight loss when appropriate.   Educational handouts provided at patient's desire.  Continue current medication(s).   Also, risks and benefits of medications discussed with patient, including alternative treatments.   Encouraged patient to read drug information handouts to further educate self about the medicine prior to starting it.   Contact us prior with any Q's/ concerns.

## 2016-03-14 NOTE — Assessment & Plan Note (Signed)
Cont Fe supp daily

## 2016-03-17 ENCOUNTER — Other Ambulatory Visit: Payer: Self-pay

## 2016-03-17 DIAGNOSIS — K219 Gastro-esophageal reflux disease without esophagitis: Secondary | ICD-10-CM

## 2016-03-17 MED ORDER — OMEPRAZOLE 20 MG PO CPDR: 20.0000 mg | DELAYED_RELEASE_CAPSULE | Freq: Every day | ORAL | 1 refills | Status: DC

## 2016-03-23 ENCOUNTER — Other Ambulatory Visit: Payer: Self-pay | Admitting: Physician Assistant

## 2016-03-23 DIAGNOSIS — M797 Fibromyalgia: Secondary | ICD-10-CM

## 2016-03-27 NOTE — Telephone Encounter (Signed)
02/2016 last ov and labs 

## 2016-04-21 ENCOUNTER — Encounter: Payer: Self-pay | Admitting: Family Medicine

## 2016-04-21 ENCOUNTER — Ambulatory Visit (INDEPENDENT_AMBULATORY_CARE_PROVIDER_SITE_OTHER): Payer: BLUE CROSS/BLUE SHIELD | Admitting: Family Medicine

## 2016-04-21 VITALS — BP 125/84 | HR 75 | Temp 98.2°F | Ht 62.0 in | Wt 220.1 lb

## 2016-04-21 DIAGNOSIS — J069 Acute upper respiratory infection, unspecified: Secondary | ICD-10-CM

## 2016-04-21 DIAGNOSIS — R05 Cough: Secondary | ICD-10-CM

## 2016-04-21 DIAGNOSIS — I10 Essential (primary) hypertension: Secondary | ICD-10-CM | POA: Diagnosis not present

## 2016-04-21 DIAGNOSIS — R0602 Shortness of breath: Secondary | ICD-10-CM

## 2016-04-21 DIAGNOSIS — R058 Other specified cough: Secondary | ICD-10-CM

## 2016-04-21 MED ORDER — ALBUTEROL SULFATE HFA 108 (90 BASE) MCG/ACT IN AERS: 2.0000 | INHALATION_SPRAY | Freq: Four times a day (QID) | RESPIRATORY_TRACT | 11 refills | Status: DC | PRN

## 2016-04-21 MED ORDER — HYDROCOD POLST-CPM POLST ER 10-8 MG/5ML PO SUER: 5.0000 mL | Freq: Two times a day (BID) | ORAL | 0 refills | Status: DC | PRN

## 2016-04-21 MED ORDER — HYDROCHLOROTHIAZIDE 25 MG PO TABS: 25.0000 mg | ORAL_TABLET | Freq: Every day | ORAL | 3 refills | Status: DC

## 2016-04-21 MED ORDER — AZITHROMYCIN 250 MG PO TABS: ORAL_TABLET | ORAL | 0 refills | Status: DC

## 2016-04-21 NOTE — Progress Notes (Signed)
Assessment and plan:  1. Acute upper respiratory infection   2. Productive cough   3. SOB (shortness of breath)   4. Essential hypertension    Anticipatory guidance and routine counseling done re: condition, txmnt options and need for follow up. All questions of patient's were answered.  - Since greater than  10 d sx--> trial ABX.   - Supportive care and various OTC medications discussed in addition to any prescribed. - Call or RTC if new symptoms, or if no improvement or worse over next couple days.   - Exactly how/ when to take meds d/c pt.    New Prescriptions   ALBUTEROL (PROVENTIL HFA;VENTOLIN HFA) 108 (90 BASE) MCG/ACT INHALER    Inhale 2 puffs into the lungs every 6 (six) hours as needed for wheezing.   AZITHROMYCIN (ZITHROMAX Z-PAK) 250 MG TABLET    Take 2 tablets (500 mg) on  Day 1,  followed by 1 tablet (250 mg) once daily on Days 2 through 5.   CHLORPHENIRAMINE-HYDROCODONE (TUSSIONEX) 10-8 MG/5ML SUER    Take 5 mLs by mouth every 12 (twelve) hours as needed for cough (cough, will cause drowsiness.).    Modified Medications   Modified Medication Previous Medication   HYDROCHLOROTHIAZIDE (HYDRODIURIL) 25 MG TABLET hydrochlorothiazide (HYDRODIURIL) 25 MG tablet      Take 1 tablet (25 mg total) by mouth daily. For high blood pressure    Take 1 tablet (25 mg total) by mouth daily. For high blood pressure    Discontinued Medications   PREDNISONE (DELTASONE) 10 MG TABLET    Take 1 tablet by mouth daily.     Gross side effects, risk and benefits, and alternatives of medications discussed with patient.  Patient is aware that all medications have potential side effects and we are unable to predict every sideeffect or drug-drug interaction that may occur.  Expresses verbal understanding and consents to current therapy plan and treatment regiment.  Return if symptoms worsen or fail to improve.  Please see AVS handed out to patient at the end of our visit for additional  patient instructions/ counseling done pertaining to today's office visit.  Note: This document was prepared using Dragon voice recognition software and may include unintentional dictation errors.    Subjective:    Chief Complaint  Patient presents with  . URI    HPI:    CC:  Pt presents with URI sx for 3 weeks- no W or B.      C/o rhinorrhea, ST, HA and cough.     Denies objective F/C, No face pain or ear pain, Had N/V, which has now resolved.  No D, No SOB/DIB, No Rash.     hasn't taken anything for sx.    F/u HTN - stable on meds. Asx.       Patient Active Problem List   Diagnosis Date Noted  . Obesity 03/11/2016    Priority: High  . Mixed hyperlipidemia 03/11/2016    Priority: High  . HTN (hypertension) 06/16/2012    Priority: High  . Gastroesophageal reflux disease 03/14/2016    Priority: Medium  . Iron (Fe) deficiency anemia 03/14/2016    Priority: Medium  . 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 Major depressive disorder, single episode, severe without psychotic features (HCC)     Priority: Medium  . Fibromyalgia 02/19/2014    Priority: Medium  . Migraine, unspecified, without mention of intractable migraine without mention of status migrainosus 06/16/2012  Priority: Medium  . Environmental and seasonal allergies 03/14/2016    Priority: Low  . Generalized anxiety disorder 08/23/2012    Priority: Low  . Insomnia 08/14/2012    Priority: Low  . Annual physical exam 11/08/2015  . 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 Hypokalemia 09/18/2013  . Family history of ischemic heart disease   . Episode of dizziness after NTG while walking to [place] 09/17/2013  . Inflammatory arthritis 09/12/2013    Past medical history, Surgical history, Family history reviewed and noted below, Social history, Allergies, and Medications have been entered into the medical record, reviewed and changed as needed.   Allergies  Allergen Reactions  .  Hydrocodone Itching    Review of Systems  Constitutional: Positive for chills, fever and malaise/fatigue.  HENT: Positive for congestion, ear pain, sinus pain and sore throat. Negative for ear discharge.   Eyes: Negative for pain and discharge.  Respiratory: Positive for cough, sputum production, shortness of breath and wheezing.   Cardiovascular: Negative for chest pain.  Gastrointestinal: Positive for diarrhea, nausea and vomiting.  Genitourinary: Negative for dysuria.  Musculoskeletal: Negative for back pain, joint pain, myalgias and neck pain.       Rib pain  Neurological: Positive for headaches. Negative for dizziness.  Endo/Heme/Allergies: Negative for environmental allergies.  Psychiatric/Behavioral: The patient does not have insomnia.     Objective:   Blood pressure 125/84, pulse 75, temperature 98.2 F (36.8 C), temperature source Oral, height 5\' 2"  (1.575 m), weight 220 lb 1.6 oz (99.8 kg). Body mass index is 40.26 kg/m. General: Well Developed, well nourished, appropriate for stated age.  Neuro: Alert and oriented x3, extra-ocular muscles intact, sensation grossly intact.  HEENT: Normocephalic, atraumatic, pupils equal round reactive to light, neck supple, no masses, no painful lymphadenopathy, TM's intact B/L, no acute findings. Nares- patent, clear d/c, OP- clear, mild erythema, No TTP sinuses Skin: Warm and dry, no gross rash. Cardiac: RRR, S1 S2,  no murmurs rubs or gallops.  Respiratory: ECTA B/L and A/P, some end exp wh- scattered throughout- not localized with good air mvmnt, Not using accessory muscles, speaking in full sentences- unlabored. Vascular:  No gross lower ext edema, cap RF less 2 sec. Psych: No HI/SI, judgement and insight good, Euthymic mood. Full Affect.   Patient Care Team    Relationship Specialty Notifications Start End  , DO PCP - General Family Medicine  03/11/16   13/9/17, MD Consulting Physician Neurosurgery  03/11/16    13/9/17, MD Consulting Physician Rheumatology  03/11/16

## 2016-04-21 NOTE — Patient Instructions (Signed)
Cough, Adult Coughing is a reflex that clears your throat and your airways. Coughing helps to heal and protect your lungs. It is normal to cough occasionally, but a cough that happens with other symptoms or lasts a long time may be a sign of a condition that needs treatment. A cough may last only 2-3 weeks (acute), or it may last longer than 8 weeks (chronic). What are the causes? Coughing is commonly caused by:  Breathing in substances that irritate your lungs.  A viral or bacterial respiratory infection.  Allergies.  Asthma.  Postnasal drip.  Smoking.  Acid backing up from the stomach into the esophagus (gastroesophageal reflux).  Certain medicines.  Chronic lung problems, including COPD (or rarely, lung cancer).  Other medical conditions such as heart failure.  Follow these instructions at home: Pay attention to any changes in your symptoms. Take these actions to help with your discomfort:  Take medicines only as told by your health care provider. ? If you were prescribed an antibiotic medicine, take it as told by your health care provider. Do not stop taking the antibiotic even if you start to feel better. ? Talk with your health care provider before you take a cough suppressant medicine.  Drink enough fluid to keep your urine clear or pale yellow.  If the air is dry, use a cold steam vaporizer or humidifier in your bedroom or your home to help loosen secretions.  Avoid anything that causes you to cough at work or at home.  If your cough is worse at night, try sleeping in a semi-upright position.  Avoid cigarette smoke. If you smoke, quit smoking. If you need help quitting, ask your health care provider.  Avoid caffeine.  Avoid alcohol.  Rest as needed.  Contact a health care provider if:  You have new symptoms.  You cough up pus.  Your cough does not get better after 2-3 weeks, or your cough gets worse.  You cannot control your cough with suppressant  medicines and you are losing sleep.  You develop pain that is getting worse or pain that is not controlled with pain medicines.  You have a fever.  You have unexplained weight loss.  You have night sweats. Get help right away if:  You cough up blood.  You have difficulty breathing.  Your heartbeat is very fast. This information is not intended to replace advice given to you by your health care provider. Make sure you discuss any questions you have with your health care provider. Document Released: 10/16/2010 Document Revised: 09/25/2015 Document Reviewed: 06/26/2014 Elsevier Interactive Patient Education  2017 [employer]  

## 2016-05-10 ENCOUNTER — Ambulatory Visit: Payer: Self-pay | Admitting: Family Medicine

## 2016-05-25 ENCOUNTER — Ambulatory Visit: Payer: Self-pay | Admitting: Family Medicine

## 2016-06-19 ENCOUNTER — Other Ambulatory Visit: Payer: Self-pay | Admitting: Physician Assistant

## 2016-06-19 DIAGNOSIS — I10 Essential (primary) hypertension: Secondary | ICD-10-CM

## 2016-06-25 ENCOUNTER — Other Ambulatory Visit: Payer: Self-pay | Admitting: Family Medicine

## 2016-06-25 DIAGNOSIS — K219 Gastro-esophageal reflux disease without esophagitis: Secondary | ICD-10-CM

## 2016-06-25 LAB — BASIC METABOLIC PANEL
BUN: 16 (ref 4–21)
BUN: 16 (ref 4–21)
Creatinine: 0.7 (ref 0.5–1.1)
Creatinine: 0.7 (ref 0.5–1.1)
GLUCOSE: 96
Glucose: 96
POTASSIUM: 3.3 — AB (ref 3.4–5.3)
SODIUM: 141 (ref 137–147)
Sodium: 141 (ref 137–147)

## 2016-06-25 LAB — CBC AND DIFFERENTIAL
HCT: 40 (ref 36–46)
HEMATOCRIT: 40 (ref 36–46)
HEMOGLOBIN: 13.6 (ref 12.0–16.0)
Hemoglobin: 13.6 (ref 12.0–16.0)
PLATELETS: 275 (ref 150–399)
PLATELETS: 275 (ref 150–399)
WBC: 6.6
WBC: 6.6

## 2016-06-25 LAB — HEPATIC FUNCTION PANEL
ALK PHOS: 100 (ref 25–125)
ALT: 53 — AB (ref 7–35)
ALT: 53 — AB (ref 7–35)
AST: 37 — AB (ref 13–35)
AST: 37 — AB (ref 13–35)
Alkaline Phosphatase: 100 (ref 25–125)
BILIRUBIN, TOTAL: 0.6
Bilirubin, Total: 0.6

## 2016-06-25 LAB — POCT ERYTHROCYTE SEDIMENTATION RATE, NON-AUTOMATED
SED RATE: 6
Sed Rate: 6

## 2016-07-09 LAB — HM HEPATITIS C SCREENING LAB: HM Hepatitis Screen: NEGATIVE

## 2016-07-16 ENCOUNTER — Ambulatory Visit: Payer: Self-pay | Admitting: Family Medicine

## 2016-07-19 ENCOUNTER — Ambulatory Visit (INDEPENDENT_AMBULATORY_CARE_PROVIDER_SITE_OTHER): Payer: BLUE CROSS/BLUE SHIELD | Admitting: Adult Health

## 2016-07-19 ENCOUNTER — Encounter: Payer: Self-pay | Admitting: Adult Health

## 2016-07-19 VITALS — BP 110/74 | HR 71 | Ht 62.0 in | Wt 213.8 lb

## 2016-07-19 DIAGNOSIS — Z79899 Other long term (current) drug therapy: Secondary | ICD-10-CM

## 2016-07-19 DIAGNOSIS — Z833 Family history of diabetes mellitus: Secondary | ICD-10-CM | POA: Diagnosis not present

## 2016-07-19 NOTE — Patient Instructions (Signed)
Hypertension Hypertension, commonly called high blood pressure, is when the force of blood pumping through the arteries is too strong. The arteries are the blood vessels that carry blood from the heart throughout the body. Hypertension forces the heart to work harder to pump blood and may cause arteries to become narrow or stiff. Having untreated or uncontrolled hypertension can cause heart attacks, strokes, kidney disease, and other problems. A blood pressure reading consists of a higher number over a lower number. Ideally, your blood pressure should be below 120/80. The first ("top") number is called the systolic pressure. It is a measure of the pressure in your arteries as your heart beats. The second ("bottom") number is called the diastolic pressure. It is a measure of the pressure in your arteries as the heart relaxes. What are the causes? The cause of this condition is not known. What increases the risk? Some risk factors for high blood pressure are under your control. Others are not. Factors you can change   Smoking.  Having type 2 diabetes mellitus, high cholesterol, or both.  Not getting enough exercise or physical activity.  Being overweight.  Having too much fat, sugar, calories, or salt (sodium) in your diet.  Drinking too much alcohol. Factors that are difficult or impossible to change   Having chronic kidney disease.  Having a family history of high blood pressure.  Age. Risk increases with age.  Race. You may be at higher risk if you are African-American.  Gender. Men are at higher risk than women before age 45. After age 65, women are at higher risk than men.  Having obstructive sleep apnea.  Stress. What are the signs or symptoms? Extremely high blood pressure (hypertensive crisis) may cause:  Headache.  Anxiety.  Shortness of breath.  Nosebleed.  Nausea and vomiting.  Severe chest pain.  Jerky movements you cannot control (seizures). How is this  diagnosed? This condition is diagnosed by measuring your blood pressure while you are seated, with your arm resting on a surface. The cuff of the blood pressure monitor will be placed directly against the skin of your upper arm at the level of your heart. It should be measured at least twice using the same arm. Certain conditions can cause a difference in blood pressure between your right and left arms. Certain factors can cause blood pressure readings to be lower or higher than normal (elevated) for a short period of time:  When your blood pressure is higher when you are in a health care provider's office than when you are at home, this is called white coat hypertension. Most people with this condition do not need medicines.  When your blood pressure is higher at home than when you are in a health care provider's office, this is called masked hypertension. Most people with this condition may need medicines to control blood pressure. If you have a high blood pressure reading during one visit or you have normal blood pressure with other risk factors:  You may be asked to return on a different day to have your blood pressure checked again.  You may be asked to monitor your blood pressure at home for 1 week or longer. If you are diagnosed with hypertension, you may have other blood or imaging tests to help your health care provider understand your overall risk for other conditions. How is this treated? This condition is treated by making healthy lifestyle changes, such as eating healthy foods, exercising more, and reducing your alcohol intake. Your health   care provider may prescribe medicine if lifestyle changes are not enough to get your blood pressure under control, and if:  Your systolic blood pressure is above 130.  Your diastolic blood pressure is above 80. Your personal target blood pressure may vary depending on your medical conditions, your age, and other factors. Follow these instructions  at home: Eating and drinking   Eat a diet that is high in fiber and potassium, and low in sodium, added sugar, and fat. An example eating plan is called the DASH (Dietary Approaches to Stop Hypertension) diet. To eat this way:  Eat plenty of fresh fruits and vegetables. Try to fill half of your plate at each meal with fruits and vegetables.  Eat whole grains, such as whole wheat pasta, brown rice, or whole grain bread. Fill about one quarter of your plate with whole grains.  Eat or drink low-fat dairy products, such as skim milk or low-fat yogurt.  Avoid fatty cuts of meat, processed or cured meats, and poultry with skin. Fill about one quarter of your plate with lean proteins, such as fish, chicken without skin, beans, eggs, and tofu.  Avoid premade and processed foods. These tend to be higher in sodium, added sugar, and fat.  Reduce your daily sodium intake. Most people with hypertension should eat less than 1,500 mg of sodium a day.  Limit alcohol intake to no more than 1 drink a day for nonpregnant women and 2 drinks a day for men. One drink equals 12 oz of beer, 5 oz of wine, or 1 oz of hard liquor. Lifestyle   Work with your health care provider to maintain a healthy body weight or to lose weight. Ask what an ideal weight is for you.  Get at least 30 minutes of exercise that causes your heart to beat faster (aerobic exercise) most days of the week. Activities may include walking, swimming, or biking.  Include exercise to strengthen your muscles (resistance exercise), such as pilates or lifting weights, as part of your weekly exercise routine. Try to do these types of exercises for 30 minutes at least 3 days a week.  Do not use any products that contain nicotine or tobacco, such as cigarettes and e-cigarettes. If you need help quitting, ask your health care provider.  Monitor your blood pressure at home as told by your health care provider.  Keep all follow-up visits as told by  your health care provider. This is important. Medicines   Take over-the-counter and prescription medicines only as told by your health care provider. Follow directions carefully. Blood pressure medicines must be taken as prescribed.  Do not skip doses of blood pressure medicine. Doing this puts you at risk for problems and can make the medicine less effective.  Ask your health care provider about side effects or reactions to medicines that you should watch for. Contact a health care provider if:  You think you are having a reaction to a medicine you are taking.  You have headaches that keep coming back (recurring).  You feel dizzy.  You have swelling in your ankles.  You have trouble with your vision. Get help right away if:  You develop a severe headache or confusion.  You have unusual weakness or numbness.  You feel faint.  You have severe pain in your chest or abdomen.  You vomit repeatedly.  You have trouble breathing. Summary  Hypertension is when the force of blood pumping through your arteries is too strong. If this condition is   not controlled, it may put you at risk for serious complications.  Your personal target blood pressure may vary depending on your medical conditions, your age, and other factors. For most people, a normal blood pressure is less than 120/80.  Hypertension is treated with lifestyle changes, medicines, or a combination of both. Lifestyle changes include weight loss, eating a healthy, low-sodium diet, exercising more, and limiting alcohol. This information is not intended to replace advice given to you by your health care provider. Make sure you discuss any questions you have with your health care provider. Document Released: 04/19/2005 Document Revised: 03/17/2016 Document Reviewed: 03/17/2016 Elsevier Interactive Patient Education  2017 [employer].   Heart-Healthy Eating Plan Many factors influence your heart health, including eating and  exercise habits. Heart (coronary) risk increases with abnormal blood fat (lipid) levels. Heart-healthy meal planning includes limiting unhealthy fats, increasing healthy fats, and making other small dietary changes. This includes maintaining a healthy body weight to help keep lipid levels within a normal range. What is my plan? Your health care provider recommends that you:  Get no more than _________% of the total calories in your daily diet from fat.  Limit your intake of saturated fat to less than _________% of your total calories each day.  Limit the amount of cholesterol in your diet to less than _________ mg per day. What types of fat should I choose?  Choose healthy fats more often. Choose monounsaturated and polyunsaturated fats, such as olive oil and canola oil, flaxseeds, walnuts, almonds, and seeds.  Eat more omega-3 fats. Good choices include salmon, mackerel, sardines, tuna, flaxseed oil, and ground flaxseeds. Aim to eat fish at least two times each week.  Limit saturated fats. Saturated fats are primarily found in animal products, such as meats, butter, and cream. Plant sources of saturated fats include palm oil, palm kernel oil, and coconut oil.  Avoid foods with partially hydrogenated oils in them. These contain trans fats. Examples of foods that contain trans fats are stick margarine, some tub margarines, cookies, crackers, and other baked goods. What general guidelines do I need to follow?  Check food labels carefully to identify foods with trans fats or high amounts of saturated fat.  Fill one half of your plate with vegetables and green salads. Eat 4-5 servings of vegetables per day. A serving of vegetables equals 1 cup of raw leafy vegetables,  cup of raw or cooked cut-up vegetables, or  cup of vegetable juice.  Fill one fourth of your plate with whole grains. Look for the word "whole" as the first word in the ingredient list.  Fill one fourth of your plate with lean  protein foods.  Eat 4-5 servings of fruit per day. A serving of fruit equals one medium whole fruit,  cup of dried fruit,  cup of fresh, frozen, or canned fruit, or  cup of 100% fruit juice.  Eat more foods that contain soluble fiber. Examples of foods that contain this type of fiber are apples, broccoli, carrots, beans, peas, and barley. Aim to get 20-30 g of fiber per day.  Eat more home-cooked food and less restaurant, buffet, and fast food.  Limit or avoid alcohol.  Limit foods that are high in starch and sugar.  Avoid fried foods.  Cook foods by using methods other than frying. Baking, boiling, grilling, and broiling are all great options. Other fat-reducing suggestions include:  Removing the skin from poultry.  Removing all visible fats from meats.  Skimming the fat off  of stews, soups, and gravies before serving them.  Steaming vegetables in water or broth.  Lose weight if you are overweight. Losing just 5-10% of your initial body weight can help your overall health and prevent diseases such as diabetes and heart disease.  Increase your consumption of nuts, legumes, and seeds to 4-5 servings per week. One serving of dried beans or legumes equals  cup after being cooked, one serving of nuts equals 1 ounces, and one serving of seeds equals  ounce or 1 tablespoon.  You may need to monitor your salt (sodium) intake, especially if you have high blood pressure. Talk with your health care provider or dietitian to get more information about reducing sodium. What foods can I eat? Grains   Breads, including [place], white, pita, wheat, raisin, rye, oatmeal, and [place]. Tortillas that are neither fried nor made with lard or trans fat. Low-fat rolls, including hotdog and hamburger buns and English muffins. Biscuits. Muffins. Waffles. Pancakes. Light popcorn. Whole-grain cereals. Flatbread. Melba toast. Pretzels. Breadsticks. Rusks. Low-fat snacks and crackers, including oyster,  saltine, matzo, graham, animal, and rye. Rice and pasta, including brown rice and those that are made with whole wheat. Vegetables  All vegetables. Fruits  All fruits, but limit coconut. Meats and Other Protein Sources  Lean, well-trimmed beef, veal, pork, and lamb. Chicken and [place] without skin. All fish and shellfish. Wild duck, rabbit, pheasant, and venison. Egg whites or low-cholesterol egg substitutes. Dried beans, peas, lentils, and tofu.Seeds and most nuts. Dairy  Low-fat or nonfat cheeses, including ricotta, string, and mozzarella. Skim or 1% milk that is liquid, powdered, or evaporated. Buttermilk that is made with low-fat milk. Nonfat or low-fat yogurt. Beverages  Mineral water. Diet carbonated beverages. Sweets and Desserts  Sherbets and fruit ices. Honey, jam, marmalade, jelly, and syrups. Meringues and gelatins. Pure sugar candy, such as hard candy, jelly beans, gumdrops, mints, marshmallows, and small amounts of dark chocolate. [employer] Eat all sweets and desserts in moderation. Fats and Oils  Nonhydrogenated (trans-free) margarines. Vegetable oils, including soybean, sesame, sunflower, olive, peanut, safflower, corn, canola, and cottonseed. Salad dressings or mayonnaise that are made with a vegetable oil. Limit added fats and oils that you use for cooking, baking, salads, and as spreads. Other  Cocoa powder. Coffee and tea. All seasonings and condiments. The items listed above may not be a complete list of recommended foods or beverages. Contact your dietitian for more options.  What foods are not recommended? Grains  Breads that are made with saturated or trans fats, oils, or whole milk. Croissants. Butter rolls. Cheese breads. Sweet rolls. Donuts. Buttered popcorn. Chow mein noodles. High-fat crackers, such as cheese or butter crackers. Meats and Other Protein Sources  Fatty meats, such as hotdogs, short ribs, sausage, spareribs, bacon, ribeye roast or steak, and  mutton. High-fat deli meats, such as salami and bologna. Caviar. Domestic duck and goose. Organ meats, such as kidney, liver, sweetbreads, brains, gizzard, chitterlings, and heart. Dairy  Cream, sour cream, cream cheese, and creamed cottage cheese. Whole milk cheeses, including blue (bleu), Monte[place] Jack, [place], [place], American, [place], Swiss, [place], [place], and [place]. Whole or 2% milk that is liquid, evaporated, or condensed. Whole buttermilk. Cream sauce or high-fat cheese sauce. Yogurt that is made from whole milk. Beverages  Regular sodas and drinks with added sugar. Sweets and Desserts  Frosting. Pudding. Cookies. Cakes other than angel food cake. Candy that has milk chocolate or white chocolate, hydrogenated fat, butter, coconut, or unknown ingredients. Buttered  syrups. Full-fat ice cream or ice cream drinks. Fats and Oils  Gravy that has suet, meat fat, or shortening. Cocoa butter, hydrogenated oils, palm oil, coconut oil, palm kernel oil. These can often be found in baked products, candy, fried foods, nondairy creamers, and whipped toppings. Solid fats and shortenings, including bacon fat, salt pork, lard, and butter. Nondairy cream substitutes, such as coffee creamers and sour cream substitutes. Salad dressings that are made of unknown oils, cheese, or sour cream. The items listed above may not be a complete list of foods and beverages to avoid. Contact your dietitian for more information.  This information is not intended to replace advice given to you by your health care provider. Make sure you discuss any questions you have with your health care provider. Document Released: 01/27/2008 Document Revised: 11/07/2015 Document Reviewed: 10/11/2013 Elsevier Interactive Patient Education  2017 [employer].  Continue all medications as directed. Increase regular movement, recommend at least 5 times a week. Follow-up in 6 months or sooner if needed.

## 2016-07-19 NOTE — Progress Notes (Signed)
Subjective:    Patient ID: [name], female    DOB: Oct 30, 1955, 61 y.o.   MRN: [id number]  HPI:  Ms. Fray presents for f/u-HTN and hyperlipidemia.  She is compliant on all medications and denies any SE.  She denies CP/dyspnea/dizziness/HA/palpitations.  She denies tobacco or ETOH use.  She has a family hx of diabetes and reports "I have never been tested for diabetes".  She reports that her Rheumatologist plans on starting her on Enbrel this month for RA.  Patient Care Team    Relationship Specialty Notifications Start End  Thomasene Lot, DO PCP - General Family Medicine  03/11/16   Hilda Lias, MD Consulting Physician Neurosurgery  03/11/16   Rossie Muskrat, MD Consulting Physician Rheumatology  03/11/16     Patient Active Problem List   Diagnosis Date Noted  . Family history of diabetes mellitus 07/19/2016  . Gastroesophageal reflux disease 03/14/2016  . Environmental and seasonal allergies 03/14/2016  . Iron (Fe) deficiency anemia 03/14/2016  . 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 Obesity 03/11/2016  . Mixed hyperlipidemia 03/11/2016  . Encounter for long-term (current) use of high-risk medication 11/08/2015  . Major depressive disorder, single episode, severe without psychotic features (HCC)   . 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 Fibromyalgia 02/19/2014  . Hypokalemia 09/18/2013  . Family history of ischemic heart disease   . Episode of dizziness after NTG while walking to [place] 09/17/2013  . Inflammatory arthritis 09/12/2013  . Generalized anxiety disorder 08/23/2012  . Insomnia 08/14/2012  . HTN (hypertension) 06/16/2012  . Migraine, unspecified, without mention of intractable migraine without mention of status migrainosus 06/16/2012     Past Medical History:  Diagnosis Date  . Allergy   . Anxiety    takes Citalopram daily  . Arthritis   . Chronic back pain    radiculopathy  . Fibromyalgia   . GERD (gastroesophageal reflux disease)    takes  Omeprazole daily  . History of bronchitis    last yr  . History of shingles   . Hyperlipidemia    takes Atorvastatin daily  . Hypertension    takes HCTZ daily as well as Propranolol  . Insomnia    takes trazodone nightly  . Joint pain   . Joint swelling   . Migraine    hx of  . Muscle spasm of back    takes Flexeril daily as needed  . Obesity   . Pneumonia    hx of-many,many yrs ago  . PONV (postoperative nausea and vomiting)    violent nausea/vomiting  . Rheumatoid arthritis (HCC)   . Seasonal allergies    takes Claritin daily  . Urinary urgency      Past Surgical History:  Procedure Laterality Date  . ABDOMINAL HYSTERECTOMY  2003  . ABDOMINOPLASTY  07/2015  . BACK SURGERY    . CARPAL TUNNEL RELEASE Bilateral   . CHOLECYSTECTOMY N/A 06/27/2012   Procedure: LAPAROSCOPIC CHOLECYSTECTOMY WITH INTRAOPERATIVE CHOLANGIOGRAM;  Surgeon: Currie Paris, MD;  Location: [facility];  Service: General;  Laterality: N/A;  . COLONOSCOPY    . LUMBAR LAMINECTOMY/DECOMPRESSION MICRODISCECTOMY Right 04/23/2014   Procedure: Right L5-S1 Laminectomy/Foraminotomy;  Surgeon: Karn Cassis, MD;  Location: [facility];  Service: Neurosurgery;  Laterality: Right;  Right L5-S1 Laminectomy/Foraminotomy  . LUMBAR WOUND DEBRIDEMENT N/A 05/01/2014   Procedure: EXPLORATION OF LUMBAR WOUND;  Surgeon: Karn Cassis, MD;  Location: [facility];  Service: Neurosurgery;  Laterality: N/A;  EXPLORATION OF LUMBAR WOUND  .  LUMBAR WOUND DEBRIDEMENT N/A 05/08/2014   Procedure: LUMBAR WOUND Exploration;  Surgeon: Karn Cassis, MD;  Location: [facility];  Service: Neurosurgery;  Laterality: N/A;  . NECK SURGERY       Family History  Problem Relation Age of Onset  . Leukemia Father   . Cancer Father     leukemia  . Diabetes Maternal Grandmother   . Rheum arthritis Maternal Grandmother   . Heart disease Paternal Grandmother   . Rheum arthritis Mother   . Heart disease Mother     2 coronary  stents  . Heart disease Maternal Uncle   . Miscarriages / Stillbirths Maternal Uncle   . Heart disease Maternal Uncle   . Colon cancer Paternal Aunt 47  . Heart disease Maternal Aunt   . Heart disease Maternal Aunt      History  Drug Use No     History  Alcohol Use No     History  Smoking Status  . Former Smoker  . Packs/day: 0.50  . Years: 10.00  . Types: Cigarettes  . Quit date: 05/03/1996  Smokeless Tobacco  . Never Used    Comment: quit smoking 61yrs ago     Outpatient Encounter Prescriptions as of 07/19/2016  Medication Sig Note  . albuterol (PROVENTIL HFA;VENTOLIN HFA) 108 (90 Base) MCG/ACT inhaler Inhale 2 puffs into the lungs every 6 (six) hours as needed for wheezing.   Marland Kitchen aspirin EC 81 MG tablet Take 1 tablet (81 mg total) by mouth daily. For health heallth   . atorvastatin (LIPITOR) 20 MG tablet Take 1 tablet (20 mg total) by mouth every evening. For High cholesterol/fat   . DULoxetine (CYMBALTA) 30 MG capsule TAKE 1 CAPSULE BY MOUTH 2 TIMES DAILY FOR DEPRESSION   . ferrous sulfate 325 (65 FE) MG tablet Take 700 mg by mouth daily with breakfast.   . gabapentin (NEURONTIN) 300 MG capsule Take 1 capsule by mouth 2 (two) times daily. 03/11/2016: Received from: External Pharmacy  . hydrochlorothiazide (HYDRODIURIL) 25 MG tablet Take 1 tablet (25 mg total) by mouth daily. For high blood pressure   . leflunomide (ARAVA) 20 MG tablet Take 1 tablet by mouth daily. 03/11/2016: Received from: External Pharmacy  . loratadine (CLARITIN) 10 MG tablet TAKE 1 TABLET BY MOUTH DAILY.   Marland Kitchen losartan (COZAAR) 50 MG tablet TAKE 1 TABLET EVERY DAY   . metroNIDAZOLE (METROGEL) 0.75 % gel Apply 1 application topically as needed. 04/21/2016: Received from: External Pharmacy Received Sig: APPLY 1 APPLICATION ON THE SKIN TWICE A DAY  . Multiple Vitamin (MULTIVITAMIN) tablet Take 1 tablet by mouth daily.   . naproxen sodium (ANAPROX) 220 MG tablet Take 220 mg by mouth 2 (two) times daily with  a meal.   . omeprazole (PRILOSEC) 20 MG capsule Take 1 capsule (20 mg total) by mouth daily.   . traMADol (ULTRAM) 50 MG tablet Take 1 tablet by mouth as needed. 03/11/2016: Received from: External Pharmacy  . traZODone (DESYREL) 100 MG tablet Take 0.5-1 tablets (50-100 mg total) by mouth at bedtime as needed for sleep.   . vitamin C (ASCORBIC ACID) 500 MG tablet Take 500 mg by mouth daily.   . [DISCONTINUED] azithromycin (ZITHROMAX Z-PAK) 250 MG tablet Take 2 tablets (500 mg) on  Day 1,  followed by 1 tablet (250 mg) once daily on Days 2 through 5.   . [DISCONTINUED] chlorpheniramine-HYDROcodone (TUSSIONEX) 10-8 MG/5ML SUER Take 5 mLs by mouth every 12 (twelve) hours as needed for cough (  cough, will cause drowsiness.).    No facility-administered encounter medications on file as of 07/19/2016.     Allergies: Hydrocodone  Body mass index is 39.1 kg/m.  Blood pressure 110/74, pulse 71, height 5\' 2"  (1.575 m), weight 213 lb 12.8 oz (97 kg).   Review of Systems  Constitutional: Positive for fatigue. Negative for activity change, appetite change, chills, diaphoresis, fever and unexpected weight change.  HENT: Negative for congestion.   Eyes: Negative for visual disturbance.  Respiratory: Negative for cough, chest tightness, shortness of breath, wheezing and stridor.   Cardiovascular: Negative for chest pain, palpitations and leg swelling.  Endocrine: Negative for cold intolerance, heat intolerance, polydipsia, polyphagia and polyuria.  Genitourinary: Negative for difficulty urinating and flank pain.  Musculoskeletal: Positive for arthralgias, back pain, joint swelling and myalgias. Negative for gait problem, neck pain and neck stiffness.  Skin: Negative for color change, pallor, rash and wound.  Neurological: Negative for dizziness, tremors, weakness, light-headedness and headaches.       Objective:   Physical Exam  Constitutional: She is oriented to person, place, and time. She appears  well-developed and well-nourished. No distress.  HENT:  Head: Normocephalic and atraumatic.  Right Ear: External ear normal.  Left Ear: External ear normal.  Eyes: Conjunctivae are normal. Pupils are equal, round, and reactive to light.  Neck: Normal range of motion.  Cardiovascular: Normal rate, regular rhythm, normal heart sounds and intact distal pulses.   Pulmonary/Chest: Effort normal and breath sounds normal. No respiratory distress. She has no wheezes. She has no rales. She exhibits no tenderness.  Lymphadenopathy:    She has no cervical adenopathy.  Neurological: She is alert and oriented to person, place, and time.  Skin: Skin is warm and dry. No rash noted. She is not diaphoretic. No erythema. No pallor.  Psychiatric: She has a normal mood and affect. Her behavior is normal. Judgment and thought content normal.  Nursing note and vitals reviewed.         Assessment & Plan:   1. Encounter for long-term (current) use of high-risk medication   2. Family history of diabetes mellitus     Family history of diabetes mellitus A1c drawn today.  Encounter for long-term (current) use of high-risk medication Compliant all medications, denies SE. Denies CP/dyspnea/dizziness/hypotension/palpitations. BP was excellent today-110/74. LFTs obtained today since on long term statin therapy. F/u in 6 months.    FOLLOW-UP:  Return in about 6 months (around 01/19/2017) for Regular Follow Up.

## 2016-07-19 NOTE — Assessment & Plan Note (Signed)
A1c drawn today  

## 2016-07-19 NOTE — Assessment & Plan Note (Signed)
Compliant all medications, denies SE. Denies CP/dyspnea/dizziness/hypotension/palpitations. BP was excellent today-110/74. LFTs obtained today since on long term statin therapy. F/u in 6 months.

## 2016-07-20 ENCOUNTER — Other Ambulatory Visit: Payer: Self-pay | Admitting: Adult Health

## 2016-07-20 DIAGNOSIS — J302 Other seasonal allergic rhinitis: Secondary | ICD-10-CM

## 2016-07-20 LAB — HEMOGLOBIN A1C
Est. average glucose Bld gHb Est-mCnc: 111 mg/dL
Hgb A1c MFr Bld: 5.5 % (ref 4.8–5.6)

## 2016-07-20 LAB — HEPATIC FUNCTION PANEL
ALBUMIN: 4 g/dL (ref 3.6–4.8)
ALK PHOS: 99 IU/L (ref 39–117)
ALT: 69 IU/L — AB (ref 0–32)
AST: 31 IU/L (ref 0–40)
BILIRUBIN TOTAL: 0.4 mg/dL (ref 0.0–1.2)
Bilirubin, Direct: 0.22 mg/dL (ref 0.00–0.40)
Total Protein: 6.2 g/dL (ref 6.0–8.5)

## 2016-07-20 MED ORDER — LORATADINE 10 MG PO TABS: 10.0000 mg | ORAL_TABLET | Freq: Every day | ORAL | 3 refills | Status: DC

## 2016-07-22 ENCOUNTER — Other Ambulatory Visit: Payer: Self-pay | Admitting: Adult Health

## 2016-07-22 DIAGNOSIS — E782 Mixed hyperlipidemia: Secondary | ICD-10-CM

## 2016-07-22 DIAGNOSIS — E7800 Pure hypercholesterolemia, unspecified: Secondary | ICD-10-CM

## 2016-07-22 MED ORDER — ATORVASTATIN CALCIUM 10 MG PO TABS: 10.0000 mg | ORAL_TABLET | Freq: Every evening | ORAL | 1 refills | Status: DC

## 2016-09-06 ENCOUNTER — Other Ambulatory Visit: Payer: Self-pay | Admitting: Family Medicine

## 2016-09-06 DIAGNOSIS — I10 Essential (primary) hypertension: Secondary | ICD-10-CM

## 2016-09-06 DIAGNOSIS — K219 Gastro-esophageal reflux disease without esophagitis: Secondary | ICD-10-CM

## 2016-09-07 ENCOUNTER — Telehealth: Payer: Self-pay | Admitting: Family Medicine

## 2016-09-07 NOTE — Telephone Encounter (Signed)
Patient is requesting a refill of her trazodone sent to her [facility] in [facility]. She states that she has been needing to take 2 pills at night to sleep versus the .5-1 that was orginally prescribed by her old PCP.

## 2016-09-08 MED ORDER — TRAZODONE HCL 100 MG PO TABS: 200.0000 mg | ORAL_TABLET | Freq: Every evening | ORAL | 0 refills | Status: DC | PRN

## 2016-09-08 NOTE — Telephone Encounter (Signed)
Please call patient and inform her that she needs f/u appt prior to any further refills on trazodone.  Tiajuana Amass, CMA

## 2016-09-08 NOTE — Addendum Note (Signed)
Addended by: Stan Head on: 09/08/2016 09:19 AM   Modules accepted: Orders

## 2016-09-08 NOTE — Telephone Encounter (Signed)
Please review and approve if appropriate.  Tiajuana Amass, CMA

## 2016-09-08 NOTE — Telephone Encounter (Signed)
Yes, thant's fine but pt has nto been seen for her chornic care since 11/17, so she needs appt with me.  Ok to give her #60. No RF

## 2016-09-30 ENCOUNTER — Other Ambulatory Visit: Payer: Self-pay | Admitting: Family Medicine

## 2016-10-04 ENCOUNTER — Encounter: Payer: Self-pay | Admitting: Family Medicine

## 2016-10-04 ENCOUNTER — Ambulatory Visit (INDEPENDENT_AMBULATORY_CARE_PROVIDER_SITE_OTHER): Payer: BLUE CROSS/BLUE SHIELD | Admitting: Family Medicine

## 2016-10-04 VITALS — BP 125/80 | HR 74 | Ht 62.0 in | Wt 213.0 lb

## 2016-10-04 DIAGNOSIS — E669 Obesity, unspecified: Secondary | ICD-10-CM

## 2016-10-04 DIAGNOSIS — F411 Generalized anxiety disorder: Secondary | ICD-10-CM

## 2016-10-04 DIAGNOSIS — R7301 Impaired fasting glucose: Secondary | ICD-10-CM | POA: Insufficient documentation

## 2016-10-04 DIAGNOSIS — R7989 Other specified abnormal findings of blood chemistry: Secondary | ICD-10-CM | POA: Diagnosis not present

## 2016-10-04 DIAGNOSIS — R945 Abnormal results of liver function studies: Secondary | ICD-10-CM

## 2016-10-04 DIAGNOSIS — G4700 Insomnia, unspecified: Secondary | ICD-10-CM

## 2016-10-04 DIAGNOSIS — I00 Rheumatic fever without heart involvement: Secondary | ICD-10-CM

## 2016-10-04 DIAGNOSIS — IMO0001 Reserved for inherently not codable concepts without codable children: Secondary | ICD-10-CM

## 2016-10-04 DIAGNOSIS — I10 Essential (primary) hypertension: Secondary | ICD-10-CM | POA: Diagnosis not present

## 2016-10-04 DIAGNOSIS — G894 Chronic pain syndrome: Secondary | ICD-10-CM | POA: Diagnosis not present

## 2016-10-04 DIAGNOSIS — Z6841 Body Mass Index (BMI) 40.0 and over, adult: Secondary | ICD-10-CM

## 2016-10-04 DIAGNOSIS — M797 Fibromyalgia: Secondary | ICD-10-CM

## 2016-10-04 DIAGNOSIS — M0520 Rheumatoid vasculitis with rheumatoid arthritis of unspecified site: Secondary | ICD-10-CM

## 2016-10-04 DIAGNOSIS — F322 Major depressive disorder, single episode, severe without psychotic features: Secondary | ICD-10-CM

## 2016-10-04 DIAGNOSIS — E782 Mixed hyperlipidemia: Secondary | ICD-10-CM | POA: Diagnosis not present

## 2016-10-04 MED ORDER — TRAZODONE HCL 100 MG PO TABS: 100.0000 mg | ORAL_TABLET | Freq: Every day | ORAL | 0 refills | Status: DC

## 2016-10-04 MED ORDER — GABAPENTIN 300 MG PO CAPS: 900.0000 mg | ORAL_CAPSULE | Freq: Three times a day (TID) | ORAL | 1 refills | Status: DC

## 2016-10-04 NOTE — Assessment & Plan Note (Signed)
Elevated since way before 2014; per pt- for as long as she can recall.    Levels have waxed and waned ever since.  W/up Hepatitis negative. Doesn't drink etoh.   Will monitor/ reck around 01/2017

## 2016-10-04 NOTE — Assessment & Plan Note (Signed)
F/up Rheum Doc-  Kathi Ludwig

## 2016-10-04 NOTE — Assessment & Plan Note (Signed)
Cont trazadone- not sure why last script given says 200mg  tab q hs;  Told pt to take 50-100mg  nitely prn.  Script fixed in chart  Sleep meditation etc d/c pt

## 2016-10-04 NOTE — Assessment & Plan Note (Addendum)
Counseling done- advised to cont with wt loss.  Discussed with patient importance of weight loss to help achieve health goals and how increasing weight, correlates to overall decreased energy levels and sense of well being, as well as increasing risk of various diseases. (or increasing risk of not controlling existing diseases.)  F/up sooner than planned if you would like to further discuss strategies to lose weight  - Weight watchers recommended

## 2016-10-04 NOTE — Assessment & Plan Note (Addendum)
.-   cont meds- well controlled; the patient declines need for RF's at this time

## 2016-10-04 NOTE — Assessment & Plan Note (Signed)
Mediate, sleep hygeine.  Once pain under better control, will see if depression and anxiety improve- if not consider med changes

## 2016-10-04 NOTE — Assessment & Plan Note (Signed)
Stable mood but worse due to chronic pain.  Once pain under better control with med changes, we will readdress pt's anxiety/ depression

## 2016-10-04 NOTE — Assessment & Plan Note (Addendum)
Restart Neurontin since tol well-- but we will titrate to higher/ effective doses.  This should help with her sleep, depression, anxiety etc   Please talk to your physical therapy folks about maybe dry needling or some other methods to help with your pain since manual therapies / muscle massage is not helping her  --->    Please let me know how you're doing about a month or so once from now, once you get up to Neurontin 900 mg 3 times per day.   You can text [place] via mychart and communicate that way but I need to hear how you are doing before I can give you refills of the neurontin  - cont with your PM&R doc for interventional pain procedures

## 2016-10-04 NOTE — Assessment & Plan Note (Signed)
Another provider dec dose statin from 20mg  to 10mg  on 07/22/16 due to a small ALT bump  Pt with long h/o elevated LFT's (both) since way before 2014.  Hepatitis w/up negative, no ETOH and level has waxed/ waned since then.   Will reck chol panel 61mo from when last had dose change--> around 9/ 18 along with ALT, AST

## 2016-10-04 NOTE — Assessment & Plan Note (Signed)
F/up with Dr Kathi Ludwig- Rheum for [facility]

## 2016-10-04 NOTE — Progress Notes (Signed)
Impression and Recommendations:    1. Essential hypertension   2. Class 3 obesity with serious comorbidity and body mass index (BMI) of 40.0 to 44.9 in adult, unspecified obesity type (HCC)   3. Mixed hyperlipidemia   4. Chronic pain syndrome   5. Generalized anxiety disorder   6. Insomnia, unspecified type   7. Elevated liver function tests   8. Major depressive disorder, single episode, severe without psychotic features (HCC)   9. 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  Discussed with patient importance of weight loss to help achieve health goals and how increasing weight, correlates to overall decreased energy levels and sense of well being, as well as increasing risk of various diseases. (or increasing risk of not controlling existing diseases.)  F/up sooner than planned if you would like to further discuss strategies to lose weight  - Weight watchers recommended  Chronic pain syndrome Restart Neurontin since tol well-- but we will titrate to higher/ effective doses.  This should help with her sleep, depression, anxiety etc   Please talk to your physical therapy folks about maybe dry needling or some other methods to help with your pain since manual therapies / muscle massage is not helping her  --->    Please let me know how you're doing about a month or so once from now, once you get up to Neurontin 900 mg 3 times per day.   You can text [place] via mychart and communicate that way but I need to hear how you are doing before I can give you refills of the neurontin  - cont with your PM&R doc for interventional pain procedures  Insomnia Cont trazadone- not sure why last script given says 200mg  tab q hs;  Told pt to take 50-100mg  nitely prn.  Script fixed in chart  Sleep meditation etc d/c pt    Generalized  anxiety disorder Mediate, sleep hygeine.  Once pain under better control, will see if depression and anxiety improve- if not consider med changes  Mixed hyperlipidemia Another provider dec dose statin from 20mg  to 10mg  on 07/22/16 due to a small ALT bump  Pt with long h/o elevated LFT's (both) since way before 2014.  Hepatitis w/up negative, no ETOH and level has waxed/ waned since then.   Will reck chol panel 87mo from when last had dose change--> around 9/ 18 along with ALT, AST  Fibromyalgia F/up with Dr Kathi Ludwig- Rheum for mgt  Major depressive disorder, single episode, severe without psychotic features (HCC) Stable mood but worse due to chronic pain.  Once pain under better control with med changes, we will readdress pt's anxiety/ depression  Rheumatoid arteritis F/up Rheum Doc-  Syed  chronic Elevated liver function tests Elevated since way before 2014; per pt- for as long as she can recall.    Levels have waxed and waned ever since.  W/up Hepatitis negative. Doesn't drink etoh.   Will monitor/ reck around 01/2017   The patient was counseled, risk factors were discussed, anticipatory guidance given.    Meds ordered this encounter  Medications  . Ibuprofen-Famotidine (DUEXIS) 800-26.6 MG TABS    Sig: Take by mouth as directed.  . gabapentin (NEURONTIN) 300 MG capsule    Sig: Take 3 capsules (900 mg total) by mouth 3 (three) times  daily.    Dispense:  270 capsule    Refill:  1  . traZODone (DESYREL) 100 MG tablet    Sig: Take 1 tablet (100 mg total) by mouth at bedtime. 1/2- 1 tab q hs prn sleep    Dispense:  90 tablet    Refill:  0    No orders of the defined types were placed in this encounter.   Gross side effects, risk and benefits, and alternatives of medications and treatment plan in general discussed with patient.  Patient is aware that all medications have potential side effects and we are unable to predict every side effect or drug-drug interaction that may  occur.   Patient will call with any questions prior to using medication if they have concerns.  Expresses verbal understanding and consents to current therapy and treatment regimen.  No barriers to understanding were identified.  Red flag symptoms and signs discussed in detail.  Patient expressed understanding regarding what to do in case of emergency\urgent symptoms  Please see AVS handed out to patient at the end of our visit for further patient instructions/ counseling done pertaining to today's office visit.   Return in about 4 months (around 02/03/2017) for Follow-up of current medical issues- restart neurontin  . Will need reck chol panel since med dose change in 3/18 and also ALT, AST, renal fxn     Note: This document was prepared using Dragon voice recognition software and may include unintentional dictation errors.  Thomasene Lot 11:44 PM --------------------------------------------------------------------------------------------------------------------------------------------------------------------------------------------------------------------------------------------    Subjective:    CC:  Chief Complaint  Patient presents with  . Hypertension  . Medication Management  . Hyperlipidemia    HPI: [name] is a 61 y.o. female who presents to [facility] at [facility] today for issues as discussed below.   Depression and chronic pain:   Not doing well as she is frustrated with her pain.   Also some excess worry/ anxiety.   Makes her feel hopeless and depressed.  No SI.   Right now 10/10 burning pain in neck, upper back and down shoulders.  Neuropathic pain per pt- makes sleeping tough as well.  Gets injections by Dr Regino Schultze- helps some.   Tolerated Neurontin well- started by Rheum, but they never inc dose past 300 mg BID.      Pain-  worst after she does yard work- like mowing, using [employer], planting, picking up twigs etc.   But, she doesn't walk/ exercise due to  pain.     HTN:   Running 120/80 at home.  Losartan and HCTZ.   tol well, no s-e.   No cp, SOB, dizzy, vis changes.  Due for her regular eye exam coming up due to needing glasses.     CHol:  More fruit and veggies.   Hurts too much to walk, so little exercise.  Appears Katy decreased  Her statin was changed from 20mg  to 10mg  in march 2018 by Orpha Bur due to a small bump in her ast   Wt:  eating more fruit/veggies and lost 7 lbs since last seen.    -  [facility] and what they cover cost if any.     PAIN:   [facility], steroid shots in neck- Dr Regino Schultze seeing her for that, chronic pain in the back and neck has her feeling down.  They are txing her for this.   Going to PT once [facility] is all  she can afford- copay 30$.    Diff to sleep--- due to pain; even after taking 2 tramadol at night.      Wt Readings from Last 3 Encounters:  10/04/16 213 lb (96.6 kg)  07/19/16 213 lb 12.8 oz (97 kg)  04/21/16 220 lb 1.6 oz (99.8 kg)   BP Readings from Last 3 Encounters:  10/04/16 125/80  07/19/16 110/74  04/21/16 125/84   Pulse Readings from Last 3 Encounters:  10/04/16 74  07/19/16 71  04/21/16 75   BMI Readings from Last 3 Encounters:  10/04/16 38.96 kg/m  07/19/16 39.10 kg/m  04/21/16 40.26 kg/m     Patient Care Team    Relationship Specialty Notifications Start End  Thomasene Lot, DO PCP - General Family Medicine  03/11/16   Hilda Lias, MD Consulting Physician Neurosurgery  03/11/16   Rossie Muskrat, MD Consulting Physician Rheumatology  03/11/16   Claria Dice, MD Attending Physician Physical Medicine and Rehabilitation  10/04/16    Comment: INjections in back/ neck-- part of Ortho     Patient Active Problem List   Diagnosis Date Noted  . Obesity 03/11/2016    Priority: High  . Mixed hyperlipidemia 03/11/2016    Priority: High  . HTN (hypertension) 06/16/2012    Priority: High  . Chronic pain syndrome 10/04/2016    Priority:  Medium  . Gastroesophageal reflux disease 03/14/2016    Priority: Medium  . Iron (Fe) deficiency anemia 03/14/2016    Priority: Medium  . 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 Major depressive disorder, single episode, severe without psychotic features (HCC)     Priority: Medium  . Fibromyalgia 02/19/2014    Priority: Medium  . Migraine, unspecified, without mention of intractable migraine without mention of status migrainosus 06/16/2012    Priority: Medium  . h/o Elevated fasting blood sugar 10/04/2016    Priority: Low  . chronic Elevated liver function tests     Priority: Low  . Family history of diabetes mellitus 07/19/2016    Priority: Low  . Environmental and seasonal allergies 03/14/2016    Priority: Low  . Generalized anxiety disorder 08/23/2012    Priority: Low  . Insomnia 08/14/2012    Priority: Low  . Encounter for long-term (current) use of high-risk medication 11/08/2015  . 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 Hypokalemia 09/18/2013  . Family history of ischemic heart disease   . Episode of dizziness after NTG while walking to [place] 09/17/2013  . Inflammatory arthritis 09/12/2013    Past Medical history, Surgical history, Family history, Social history, Allergies and Medications have been entered into the medical record, reviewed and changed as needed.    Current Meds  Medication Sig  . albuterol (PROVENTIL HFA;VENTOLIN HFA) 108 (90 Base) MCG/ACT inhaler Inhale 2 puffs into the lungs every 6 (six) hours as needed for wheezing.  Marland Kitchen aspirin EC 81 MG tablet Take 1 tablet (81 mg total) by mouth daily. For health heallth  . atorvastatin (LIPITOR) 10 MG tablet Take 1 tablet (10 mg total) by mouth every evening. For High cholesterol/fat  . DULoxetine (CYMBALTA) 30 MG capsule TAKE 1 CAPSULE BY MOUTH 2 TIMES DAILY FOR DEPRESSION  . ferrous sulfate 325 (65 FE) MG tablet Take 700 mg by mouth daily with breakfast.  . gabapentin (NEURONTIN) 300 MG  capsule Take 3 capsules (900 mg total) by mouth 3 (three) times daily.  . hydrochlorothiazide (HYDRODIURIL) 25 MG tablet Take 1 tablet (25 mg total) by  mouth daily. For high blood pressure  . Ibuprofen-Famotidine (DUEXIS) 800-26.6 MG TABS Take by mouth as directed.  . loratadine (CLARITIN) 10 MG tablet Take 1 tablet (10 mg total) by mouth daily.  Marland Kitchen losartan (COZAAR) 50 MG tablet TAKE 1 TABLET BY MOUTH EVERY DAY  . metroNIDAZOLE (METROGEL) 0.75 % gel Apply 1 application topically as needed.  . Multiple Vitamin (MULTIVITAMIN) tablet Take 1 tablet by mouth daily.  Marland Kitchen omeprazole (PRILOSEC) 20 MG capsule TAKE ONE CAPSULE BY MOUTH EVERY DAY  . traMADol (ULTRAM) 50 MG tablet Take 1 tablet by mouth as needed.  . traZODone (DESYREL) 100 MG tablet Take 1 tablet (100 mg total) by mouth at bedtime. 1/2- 1 tab q hs prn sleep  . vitamin C (ASCORBIC ACID) 500 MG tablet Take 500 mg by mouth daily.  . [DISCONTINUED] gabapentin (NEURONTIN) 300 MG capsule Take 1 capsule by mouth 2 (two) times daily.  . [DISCONTINUED] leflunomide (ARAVA) 20 MG tablet Take 1 tablet by mouth daily.  . [DISCONTINUED] naproxen sodium (ANAPROX) 220 MG tablet Take 220 mg by mouth 2 (two) times daily with a meal.  . [DISCONTINUED] traZODone (DESYREL) 100 MG tablet TAKE 2 TABLETS BY MOUTH AT BEDTIME AS NEEDED FOR SLEEP    Allergies:  Allergies  Allergen Reactions  . Hydrocodone Itching     Review of Systems: General:   Denies fever, chills, unexplained weight loss.  Optho/Auditory:   Denies visual changes, blurred vision/LOV Respiratory:   Denies wheeze, DOE more than baseline levels.  Cardiovascular:   Denies chest pain, palpitations, new onset peripheral edema  Gastrointestinal:   Denies nausea, vomiting, diarrhea, abd pain.  Genitourinary: Denies dysuria, freq/ urgency, flank pain or discharge from genitals.  Endocrine:     Denies hot or cold intolerance, polyuria, polydipsia. Musculoskeletal:   Denies unexplained myalgias,  joint swelling, unexplained arthralgias, gait problems.  Skin:  Denies new onset rash, suspicious lesions Neurological:     Denies dizziness, unexplained weakness, numbness  Psychiatric/Behavioral:   Denies mood changes, suicidal or homicidal ideations, hallucinations    Objective:   Blood pressure 125/80, pulse 74, height 5\' 2"  (1.575 m), weight 213 lb (96.6 kg), SpO2 98 %. Body mass index is 38.96 kg/m. General:  Well Developed, well nourished, appropriate for stated age.  Neuro:  Alert and oriented,  extra-ocular muscles intact  HEENT:  Normocephalic, atraumatic, neck supple, no carotid bruits appreciated  Skin:  no gross rash, warm, pink. Cardiac:  RRR, S1 S2 Respiratory:  ECTA B/L and A/P, Not using accessory muscles, speaking in full sentences- unlabored. Vascular:  Ext warm, no cyanosis apprec.; cap RF less 2 sec. Psych:  No HI/SI, judgement and insight good, Euthymic mood. Full Affect.

## 2016-10-04 NOTE — Patient Instructions (Addendum)
- Please try to talk to our physical therapy folks about maybe dry needling or some other methods to help with your pain.  --->    Please let me know how you're doing about a month or so once from now, once you get up to 900 mg 3 times per day.  You can text [place] via my chart and communicate that way but I need to here how you are doing before I can give you refills.     ---->  For the Neurontin, this is a medicine that can potentially cause side effects and we will need to go up on the dose slowly over time.  - Start off with taking one Neurontin cap every evening for 3 days, if well tolerated then-  - take 1 cap twice daily for 3 days, if still well tolerated then-   - take 1 cap 3 times daily for 3 days, if tolerated then-   - take 1 in the morning, 1 in the afternoon and 2 caps at night for 3 days, if well tolerated-  (such as:  0,0,1 * 3 d 0,1,1 * 3 days,  1,1,1, * 3 days 1,1,2 * 3 days 1,2,2 * 3 days)   - take 1 in the morning,  2 in the afternoon, and 2 at night for 3 days and then slowly work your way up to 2 caps 3 times a day, then   - take 2, 2, 3 caps * 3 days etc.    We will go up on the dose and give you a new prescription once you are running low ( down to about 1 week of pills).   Work all the way up to 900 mg 3 times day and let us know how you're doing.     4 your blood work we will recheck your cholesterol, kidney function etc.in October during her yearly physical -so hopefully insurance will pay for it and won't give you problems with that.     -- since we are restarting the Neurontin and the pain is causing you to be miserable, once we get that under better control if we still need to change your Cymbalta around or further address your anxiety/ depression then, we will.         What is Chronic Stress Syndrome, Symptoms & Ways to Deal With it   What is Chronic Stress Syndrome?  Chronic Stress Syndrome is something which can now be called as a  medical condition due to the amount of stress an individual is going through these days. Chronic Stress Syndrome causes the body and mind to shutdown and the person has no control over himself or herself. Due to the demands of modern day life and the hardship throughout day and night takes its toll over a period of time and the body and brain starts demanding rest and a break. This leads to certain symptoms where your performance level starts to dip at work, you become irritable both at work and at home, you may stop enjoying activities you previously liked, you may become depressed, you may get angry for even small things. Chronic Stress Syndrome can significantly impact your quality life. Thus it is important understand the symptoms of Chronic Stress Syndrome and react accordingly in order to cope up with it.  It is important to note here that a balanced work-home equation should be drawn to cut down symptoms of Chronic Stress Syndrome. Minor stressors can be overcome by the body's inbuilt  stress response but when there is unending stress for a long period of time then an external help is required to ease the stress.  Chronic Stress Syndrome can physically and psychologically drain you over a period of time. For such cases stress management is the best way to cope up with Chronic Stress Syndrome. If Chronic Stress Syndrome is not treated then it may result in many health hazards like anxiety, muscle pain, insomnia, and high blood pressure along with a compromised immune system leading to frequent infections and missed days from work.    What are the Symptoms of Chronic Stress Syndrome?   The symptoms of Chronic Stress Syndrome are variable and range from generalized symptoms to emotional symptoms along with behavioral and cognitive symptoms. Some of these symptoms have been delineated below:  Generalized Symptoms of Chronic Stress Syndrome are: Anxiety Depression Social  isolation Headache Abdominal pain Lack of sleep Back pain Difficulty in concentrating Hypertension Hemorrhoids Varicose veins Panic attacks/ Panic disorder Cardiovascular diseases.   Some of the Emotional Symptoms of Chronic Stress Syndrome are: To become easily agitated, moody and frustrated Feeling overwhelmed which makes you feel like you are losing control. Having difficulty relaxing and have a peaceful mind Having low self esteem Feeling lonely Feeling worthless Feeling depressed Avoiding social environment.   Some of the Physical Symptoms of Chronic Stress Syndrome are: Headaches Lethargy Alternating diarrhea and constipation Nausea Muscles aches and pains Insomnia Rapid heartbeat and chest pain Infections and frequent colds Decreased libido Nervousness and shaking Tinnitus Sweaty palms Dry mouth Clenched jaw.  Some of the Cognitive Symptoms of Chronic Stress Syndrome are: Constant worrying Racing thoughts Disorganization and forgetfulness Inability to focus Poor judgment Abundance of negativity.  Some of the Behavioral Symptoms of Chronic Stress Syndrome are: Changes in appetite with less desire to eat Avoiding responsibilities Indulgence in alcohol or recreational drug use Increased nail biting and being fidgety Ways to Deal With Chronic Stress Syndrome    Chronic Stress Syndrome is not something which cannot be addressed. A bit of effort from your side in the form of lifestyle modifications, a little bit of exercise, a balanced work life equation can do wonders and help you get rid of Chronic Stress Syndrome.  Get Proper Sleep: It has been proved that Chronic Stress Syndrome causes loss of sleep where an individual may not even be able to sleep for days unending. This may result in the individual feeling lethargic and unable to focus at work the following morning. This may lead to decreased performance at work. Thus, it is important to have a good  sleep-wake cycle. For this, try and not drink any caffeinated beverage about four hours prior to going to sleep, as caffeine pumps up the adrenaline and causes you to stay awake resulting ultimately in Chronic Stress Syndrome.  Avoid Alcohol and Drugs: Another way to get rid of Chronic Stress Syndrome is lifestyle modifications. Stay away from alcohol and other recreational drugs. Take Short Frequent Breaks at Work: Try to take frequent breaks from work and do not work continuously. Try and manage your work in such a way that you even meet your deadline and come home on time for a happy dinner with family. A good time spent with family and kids does wonders in not only dealing with Chronic Stress Syndrome but also preventing it.  Become Physically Active: Another step towards getting rid of Chronic Stress Syndrome is physical activity. If you do not have time to spend at the gym  then at least try and go for daily walks for about half an hour a day which not only keeps the stress away but also is good for your overall health. Physical activity leads to production of endorphins which will make you feel relaxed and feel good.  Healthy Diet Can Help You Deal With Chronic Stress Syndrome: Have a balanced and healthy diet is another step towards a stress free life and keeping Chronic Stress Syndrome at bay. If time is a constraint then you can try eating three small meals a day. Try and avoid fast foods and take foods which are healthy and rich in proteins, fiber, and carbohydrates to boost your energy system.  Music Can Soothe Your Mind: Light music is one of the best and most effective relaxation techniques that one can try to overcome stress. It has shown to calm down the mind and take you away from all the stressors that you may be having. These days it is also being used as a therapy in some institutes for overcoming stress. It is important here to discuss the importance of a good social support system for  patients with Chronic Stress Syndrome, as a good social support framework can do wonders in taking the stress away from the patient and overcoming Chronic Stress Syndrome.  Meditation Can Help You Deal With Chronic Stress Syndrome Effectively: Meditation and yoga has also shown to be quite effective in relaxing the mind and coping up with Chronic Stress Syndrome   In cases where these measures are not helpful, then it is time for you to consult with a skilled psychologist or a psychiatrist for potential therapies or medications to control the stress response.   The psychologist can help you with a variety of steps for coping up with Chronic Stress Syndrome. Relaxation techniques and behavioral therapy are some of the methods employed by psychologists. In some cases, medications can also be given to help relax the patient.  Since Chronic Stress Syndrome is both emotionally and physically draining for the patient and it also adversely affects the family life of the patient hence it is important for the patient to recognize the condition and taking steps to cope up with it. Escaping measures like alcohol and drug use are of no help as they only aggravate the condition apart from their other health hazards. If this condition is ignored or left untreated it can lead to various medical conditions like anxiety and depression and various other medical conditions.  Last but not least, smile as often as you can as it is the best gift that you can give to someone. The best way to stay relaxed is to have a good smile, exercise daily, spend time with your family, meditation and if required consultation with a good psychologist so that you can live a stress free life and overcome the symptoms of Chronic Stress Syndrome.

## 2016-10-05 ENCOUNTER — Telehealth: Payer: Self-pay | Admitting: Family Medicine

## 2016-10-05 NOTE — Telephone Encounter (Signed)
PT states [name] called her left msg & sent a Mychart message as well ,she is responding. --glh

## 2016-10-05 NOTE — Telephone Encounter (Signed)
Patient called asking about a prescription her and Dr. Val Eagle talked about yesterday. She states its some sort of pain management med and when she called her pharmacy they state they have not received it yet. Also, I asked her could it have been the trazodone or gabapentin they she meant (as I saw refills ordered yesterday) and she stated she does not take these meds.

## 2016-10-05 NOTE — Telephone Encounter (Signed)
Pain medicine was gabapentin.  This was prescribed to her yesterday and discussed in detail for her neuropathic chronic pain syndrome.

## 2016-10-05 NOTE — Telephone Encounter (Signed)
Left message for patient to call back to discuss medications.  MyChart message also sent.

## 2016-10-05 NOTE — Telephone Encounter (Signed)
Left message for patient to call back regarding her medication concerns.

## 2016-10-06 ENCOUNTER — Telehealth: Payer: Self-pay | Admitting: Family Medicine

## 2016-10-06 NOTE — Telephone Encounter (Signed)
Patient left VM stating she missed your phone call and that you may call her back at your earliest convenience

## 2016-10-07 NOTE — Telephone Encounter (Signed)
Left message for patient to return call about her medication concern.

## 2016-11-29 ENCOUNTER — Other Ambulatory Visit: Payer: Self-pay | Admitting: Family Medicine

## 2016-11-29 DIAGNOSIS — I10 Essential (primary) hypertension: Secondary | ICD-10-CM

## 2016-12-13 ENCOUNTER — Other Ambulatory Visit: Payer: Self-pay | Admitting: Family Medicine

## 2016-12-28 ENCOUNTER — Encounter: Payer: Self-pay | Admitting: Adult Health

## 2016-12-28 ENCOUNTER — Ambulatory Visit (INDEPENDENT_AMBULATORY_CARE_PROVIDER_SITE_OTHER): Payer: BLUE CROSS/BLUE SHIELD | Admitting: Adult Health

## 2016-12-28 DIAGNOSIS — M546 Pain in thoracic spine: Secondary | ICD-10-CM | POA: Diagnosis not present

## 2016-12-28 MED ORDER — CYCLOBENZAPRINE HCL 10 MG PO TABS: 10.0000 mg | ORAL_TABLET | Freq: Every day | ORAL | 0 refills | Status: DC

## 2016-12-28 MED ORDER — PREDNISONE 20 MG PO TABS: 20.0000 mg | ORAL_TABLET | Freq: Two times a day (BID) | ORAL | 0 refills | Status: DC

## 2016-12-28 NOTE — Assessment & Plan Note (Signed)
Back exercise information provided, began Thursday. Prednisone 20mg  BID with meals. Nightly Cyclobenzaprine 10mg . If sx's persist after prednisone completed then RTC.

## 2016-12-28 NOTE — Progress Notes (Signed)
Subjective:    Patient ID: [name], female    DOB: [birth year], 61 y.o.   MRN: [id number]  HPI:  [name] is here for thoracic back pain that is a result of her moving a large entertainment center (with sliders) on 8.19/18.  She denies acute injury/trauma during the furniture slide, however she awoke wed morning with substantial thoracic back pain-which is constant, aching, and rated 8/10.  She denies bowel/bladder dysfunction/numbness or tingling in feet. She has been treating with rest and NSAIDs and pain has not improved.  Patient Care Team    Relationship Specialty Notifications Start End  Thomasene Lot, DO PCP - General Family Medicine  03/11/16   Hilda Lias, MD Consulting Physician Neurosurgery  03/11/16   Rossie Muskrat, MD Consulting Physician Rheumatology  03/11/16   Claria Dice, MD Attending Physician Physical Medicine and Rehabilitation  10/04/16    Comment: INjections in back/ neck-- part of Ortho    Patient Active Problem List   Diagnosis Date Noted  . Chronic pain syndrome 10/04/2016  . h/o Elevated fasting blood sugar 10/04/2016  . chronic Elevated liver function tests   . Family history of diabetes mellitus 07/19/2016  . Gastroesophageal reflux disease 03/14/2016  . Environmental and seasonal allergies 03/14/2016  . Iron (Fe) deficiency anemia 03/14/2016  . 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 Obesity 03/11/2016  . Mixed hyperlipidemia 03/11/2016  . Encounter for long-term (current) use of high-risk medication 11/08/2015  . Major depressive disorder, single episode, severe without psychotic features (HCC)   . 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 Fibromyalgia 02/19/2014  . Hypokalemia 09/18/2013  . Family history of ischemic heart disease   . Episode of dizziness after NTG while walking to [place] 09/17/2013  . Inflammatory arthritis 09/12/2013  . Generalized anxiety disorder 08/23/2012  . Insomnia 08/14/2012  . HTN (hypertension) 06/16/2012  .  Migraine, unspecified, without mention of intractable migraine without mention of status migrainosus 06/16/2012     Past Medical History:  Diagnosis Date  . Allergy   . Anxiety    takes Citalopram daily  . Arthritis   . Chronic back pain    radiculopathy  . Fibromyalgia   . GERD (gastroesophageal reflux disease)    takes Omeprazole daily  . History of bronchitis    last yr  . History of shingles   . Hyperlipidemia    takes Atorvastatin daily  . Hypertension    takes HCTZ daily as well as Propranolol  . Insomnia    takes trazodone nightly  . Joint pain   . Joint swelling   . Migraine    hx of  . Muscle spasm of back    takes Flexeril daily as needed  . Obesity   . Pneumonia    hx of-many,many yrs ago  . PONV (postoperative nausea and vomiting)    violent nausea/vomiting  . Rheumatoid arthritis (HCC)   . Seasonal allergies    takes Claritin daily  . Urinary urgency      Past Surgical History:  Procedure Laterality Date  . ABDOMINAL HYSTERECTOMY  2003  . ABDOMINOPLASTY  07/2015  . BACK SURGERY    . CARPAL TUNNEL RELEASE Bilateral   . CHOLECYSTECTOMY N/A 06/27/2012   Procedure: LAPAROSCOPIC CHOLECYSTECTOMY WITH INTRAOPERATIVE CHOLANGIOGRAM;  Surgeon: Currie Paris, MD;  Location: [facility];  Service: General;  Laterality: N/A;  . COLONOSCOPY    . LUMBAR LAMINECTOMY/DECOMPRESSION MICRODISCECTOMY Right 04/23/2014   Procedure: Right L5-S1 Laminectomy/Foraminotomy;  Surgeon: Karn Cassis,  MD;  Location: [facility];  Service: Neurosurgery;  Laterality: Right;  Right L5-S1 Laminectomy/Foraminotomy  . LUMBAR WOUND DEBRIDEMENT N/A 05/01/2014   Procedure: EXPLORATION OF LUMBAR WOUND;  Surgeon: Karn Cassis, MD;  Location: [facility];  Service: Neurosurgery;  Laterality: N/A;  EXPLORATION OF LUMBAR WOUND  . LUMBAR WOUND DEBRIDEMENT N/A 05/08/2014   Procedure: LUMBAR WOUND Exploration;  Surgeon: Karn Cassis, MD;  Location: [facility];  Service: Neurosurgery;   Laterality: N/A;  . NECK SURGERY       Family History  Problem Relation Age of Onset  . Leukemia Father   . Cancer Father        leukemia  . Diabetes Maternal Grandmother   . Rheum arthritis Maternal Grandmother   . Heart disease Paternal Grandmother   . Rheum arthritis Mother   . Heart disease Mother        2 coronary stents  . Heart disease Maternal Uncle   . Miscarriages / Stillbirths Maternal Uncle   . Heart disease Maternal Uncle   . Colon cancer Paternal Aunt 49  . Heart disease Maternal Aunt   . Heart disease Maternal Aunt      History  Drug Use No     History  Alcohol Use No     History  Smoking Status  . Former Smoker  . Packs/day: 0.50  . Years: 10.00  . Types: Cigarettes  . Quit date: 05/03/1996  Smokeless Tobacco  . Never Used    Comment: quit smoking 54yrs ago     Outpatient Encounter Prescriptions as of 12/28/2016  Medication Sig Note  . aspirin EC 81 MG tablet Take 1 tablet (81 mg total) by mouth daily. For health heallth   . atorvastatin (LIPITOR) 10 MG tablet Take 1 tablet (10 mg total) by mouth every evening. For High cholesterol/fat   . DULoxetine (CYMBALTA) 30 MG capsule TAKE 1 CAPSULE BY MOUTH 2 TIMES DAILY FOR DEPRESSION   . ferrous sulfate 325 (65 FE) MG tablet Take 700 mg by mouth daily with breakfast.   . hydrochlorothiazide (HYDRODIURIL) 25 MG tablet Take 1 tablet (25 mg total) by mouth daily. For high blood pressure   . Ibuprofen-Famotidine (DUEXIS) 800-26.6 MG TABS Take by mouth as directed.   . loratadine (CLARITIN) 10 MG tablet Take 1 tablet (10 mg total) by mouth daily.   Marland Kitchen losartan (COZAAR) 50 MG tablet TAKE 1 TABLET BY MOUTH EVERY DAY   . Multiple Vitamin (MULTIVITAMIN) tablet Take 1 tablet by mouth daily.   . vitamin C (ASCORBIC ACID) 500 MG tablet Take 500 mg by mouth daily.   . [DISCONTINUED] albuterol (PROVENTIL HFA;VENTOLIN HFA) 108 (90 Base) MCG/ACT inhaler Inhale 2 puffs into the lungs every 6 (six) hours as needed for  wheezing.   . [DISCONTINUED] gabapentin (NEURONTIN) 300 MG capsule Take 3 capsules (900 mg total) by mouth 3 (three) times daily.   . [DISCONTINUED] metroNIDAZOLE (METROGEL) 0.75 % gel Apply 1 application topically as needed. 04/21/2016: Received from: External Pharmacy Received Sig: APPLY 1 APPLICATION ON THE SKIN TWICE A DAY  . [DISCONTINUED] omeprazole (PRILOSEC) 20 MG capsule TAKE ONE CAPSULE BY MOUTH EVERY DAY   . [DISCONTINUED] traMADol (ULTRAM) 50 MG tablet Take 1 tablet by mouth as needed. 03/11/2016: Received from: External Pharmacy  . [DISCONTINUED] traZODone (DESYREL) 100 MG tablet Take 1 tablet (100 mg total) by mouth at bedtime. 1/2- 1 tab q hs prn sleep   . [DISCONTINUED] traZODone (DESYREL) 100 MG tablet Take  1/2 to 1 tablet qhs prn sleep   . cyclobenzaprine (FLEXERIL) 10 MG tablet Take 1 tablet (10 mg total) by mouth at bedtime.   Elgie Collard SURECLICK 50 MG/ML injection Inject 1 mL into the skin once a week.   . ondansetron (ZOFRAN-ODT) 4 MG disintegrating tablet Take 1 tablet by mouth as needed.   . predniSONE (DELTASONE) 20 MG tablet Take 1 tablet (20 mg total) by mouth 2 (two) times daily with a meal.   . traZODone (DESYREL) 100 MG tablet Take 2 tablets by mouth at bedtime as needed. for sleep    No facility-administered encounter medications on file as of 12/28/2016.     Allergies: Hydrocodone  Body mass index is 38.98 kg/m.  Blood pressure 117/72, pulse 74, height 5\' 2"  (1.575 m), weight 213 lb 1.6 oz (96.7 kg).     Review of Systems  Constitutional: Positive for activity change and fatigue. Negative for appetite change, chills, diaphoresis, fever and unexpected weight change.  Musculoskeletal: Positive for arthralgias, back pain, gait problem and myalgias. Negative for joint swelling, neck pain and neck stiffness.  Hematological: Does not bruise/bleed easily.  Psychiatric/Behavioral: Positive for sleep disturbance.       Objective:   Physical Exam   Constitutional: She appears well-developed and well-nourished. She appears distressed.  Musculoskeletal: She exhibits tenderness.       Cervical back: Normal.       Thoracic back: She exhibits decreased range of motion, tenderness, pain and spasm.       Lumbar back: Normal.  Skin: Skin is warm and dry. No rash noted. She is not diaphoretic. No erythema. No pallor.  Psychiatric: She has a normal mood and affect. Her behavior is normal. Judgment and thought content normal.  Nursing note and vitals reviewed.         Assessment & Plan:   1. Acute bilateral thoracic back pain     Thoracic back pain Back exercise information provided, began Thursday. Prednisone 20mg  BID with meals. Nightly Cyclobenzaprine 10mg . If sx's persist after prednisone completed then RTC.    FOLLOW-UP:  Return if symptoms worsen or fail to improve.

## 2016-12-28 NOTE — Patient Instructions (Signed)
Thoracic Strain A thoracic strain, which is sometimes called a mid-back strain, is an injury to the muscles or tendons that attach to the upper part of your back behind your chest. This type of injury occurs when a muscle is overstretched or overloaded. Thoracic strains can range from mild to severe. Mild strains may involve stretching a muscle or tendon without tearing it. These injuries may heal in 1-2 weeks. More severe strains involve tearing of muscle fibers or tendons. These will cause more pain and may take 6-8 weeks to heal. What are the causes? This condition may be caused by:  An injury in which a sudden force is placed on the muscle.  Exercising without properly warming up.  Overuse of the muscle.  Improper form during certain movements.  Other injuries that surround or cause stress on the mid-back, causing a strain on the muscles.  In some cases, the cause may not be known. What increases the risk? This injury is more common in:  Athletes.  People with obesity.  What are the signs or symptoms? The main symptom of this condition is pain, especially with movement. Other symptoms include:  Bruising.  Swelling.  Spasm.  How is this diagnosed? This condition may be diagnosed with a physical exam. X-rays may be taken to check for a fracture. How is this treated? This condition may be treated with:  Resting and icing the injured area.  Physical therapy. This will involve doing stretching and strengthening exercises.  Medicines for pain and inflammation.  Follow these instructions at home:  Rest as needed. Follow instructions from your health care provider about any restrictions on activity.  If directed, apply ice to the injured area: ? Put ice in a plastic bag. ? Place a towel between your skin and the bag. ? Leave the ice on for 20 minutes, 2-3 times per day.  Take over-the-counter and prescription medicines only as told by your health care  provider.  Begin doing exercises as told by your health care provider or physical therapist.  Always warm up properly before physical activity or sports.  Bend your knees before you lift heavy objects.  Keep all follow-up visits as told by your health care provider. This is important. Contact a health care provider if:  Your pain is not helped by medicine.  Your pain, bruising, or swelling is getting worse.  You have a fever. Get help right away if:  You have shortness of breath.  You have chest pain.  You develop numbness or weakness in your legs.  You have involuntary loss of urine (urinary incontinence). This information is not intended to replace advice given to you by your health care provider. Make sure you discuss any questions you have with your health care provider. Document Released: 07/10/2003 Document Revised: 12/20/2015 Document Reviewed: 06/13/2014 Elsevier Interactive Patient Education  2018 [employer] Back Exercises The following exercises strengthen the muscles that help to support the back. They also help to keep the lower back flexible. Doing these exercises can help to prevent back pain or lessen existing pain. If you have back pain or discomfort, try doing these exercises 2-3 times each day or as told by your health care provider. When the pain goes away, do them once each day, but increase the number of times that you repeat the steps for each exercise (do more repetitions). If you do not have back pain or discomfort, do these exercises once each day or as told by your health care provider.  Exercises Single Knee to Chest  Repeat these steps 3-5 times for each leg: 1. Lie on your back on a firm bed or the floor with your legs extended. 2. Bring one knee to your chest. Your other leg should stay extended and in contact with the floor. 3. Hold your knee in place by grabbing your knee or thigh. 4. Pull on your knee until you feel a gentle stretch in your  lower back. 5. Hold the stretch for 10-30 seconds. 6. Slowly release and straighten your leg.  Pelvic Tilt  Repeat these steps 5-10 times: 1. Lie on your back on a firm bed or the floor with your legs extended. 2. Bend your knees so they are pointing toward the ceiling and your feet are flat on the floor. 3. Tighten your lower abdominal muscles to press your lower back against the floor. This motion will tilt your pelvis so your tailbone points up toward the ceiling instead of pointing to your feet or the floor. 4. With gentle tension and even breathing, hold this position for 5-10 seconds.  Cat-Cow  Repeat these steps until your lower back becomes more flexible: 1. Get into a hands-and-knees position on a firm surface. Keep your hands under your shoulders, and keep your knees under your hips. You may place padding under your knees for comfort. 2. Let your head hang down, and point your tailbone toward the floor so your lower back becomes rounded like the back of a cat. 3. Hold this position for 5 seconds. 4. Slowly lift your head and point your tailbone up toward the ceiling so your back forms a sagging arch like the back of a cow. 5. Hold this position for 5 seconds.  Press-Ups  Repeat these steps 5-10 times: 1. Lie on your abdomen (face-down) on the floor. 2. Place your palms near your head, about shoulder-width apart. 3. While you keep your back as relaxed as possible and keep your hips on the floor, slowly straighten your arms to raise the top half of your body and lift your shoulders. Do not use your back muscles to raise your upper torso. You may adjust the placement of your hands to make yourself more comfortable. 4. Hold this position for 5 seconds while you keep your back relaxed. 5. Slowly return to lying flat on the floor.  Bridges  Repeat these steps 10 times: 1. Lie on your back on a firm surface. 2. Bend your knees so they are pointing toward the ceiling and your feet  are flat on the floor. 3. Tighten your buttocks muscles and lift your buttocks off of the floor until your waist is at almost the same height as your knees. You should feel the muscles working in your buttocks and the back of your thighs. If you do not feel these muscles, slide your feet 1-2 inches farther away from your buttocks. 4. Hold this position for 3-5 seconds. 5. Slowly lower your hips to the starting position, and allow your buttocks muscles to relax completely.  If this exercise is too easy, try doing it with your arms crossed over your chest. Abdominal Crunches  Repeat these steps 5-10 times: 1. Lie on your back on a firm bed or the floor with your legs extended. 2. Bend your knees so they are pointing toward the ceiling and your feet are flat on the floor. 3. Cross your arms over your chest. 4. Tip your chin slightly toward your chest without bending your neck. 5. Tighten  your abdominal muscles and slowly raise your trunk (torso) high enough to lift your shoulder blades a tiny bit off of the floor. Avoid raising your torso higher than that, because it can put too much stress on your low back and it does not help to strengthen your abdominal muscles. 6. Slowly return to your starting position.  Back Lifts Repeat these steps 5-10 times: 1. Lie on your abdomen (face-down) with your arms at your sides, and rest your forehead on the floor. 2. Tighten the muscles in your legs and your buttocks. 3. Slowly lift your chest off of the floor while you keep your hips pressed to the floor. Keep the back of your head in line with the curve in your back. Your eyes should be looking at the floor. 4. Hold this position for 3-5 seconds. 5. Slowly return to your starting position.  Contact a health care provider if:  Your back pain or discomfort gets much worse when you do an exercise.  Your back pain or discomfort does not lessen within 2 hours after you exercise. If you have any of these  problems, stop doing these exercises right away. Do not do them again unless your health care provider says that you can. Get help right away if:  You develop sudden, severe back pain. If this happens, stop doing the exercises right away. Do not do them again unless your health care provider says that you can. This information is not intended to replace advice given to you by your health care provider. Make sure you discuss any questions you have with your health care provider. Document Released: 05/27/2004 Document Revised: 08/27/2015 Document Reviewed: 06/13/2014 Elsevier Interactive Patient Education  2017 [employer].  Begin stretches Thursday. Please take prednisone 20mg  twice daily with meals for 4 days. Please take cyclobenzaprine 10mg  at bedtime. If symptoms persist after prednisone is completed, then please call clinic. FEEL BETTER!

## 2017-01-06 ENCOUNTER — Encounter: Payer: Self-pay | Admitting: Family Medicine

## 2017-01-06 ENCOUNTER — Ambulatory Visit (INDEPENDENT_AMBULATORY_CARE_PROVIDER_SITE_OTHER): Payer: BLUE CROSS/BLUE SHIELD | Admitting: Family Medicine

## 2017-01-06 VITALS — BP 138/85 | HR 89 | Ht 62.0 in | Wt 210.8 lb

## 2017-01-06 DIAGNOSIS — I10 Essential (primary) hypertension: Secondary | ICD-10-CM | POA: Diagnosis not present

## 2017-01-06 DIAGNOSIS — G4700 Insomnia, unspecified: Secondary | ICD-10-CM | POA: Diagnosis not present

## 2017-01-06 DIAGNOSIS — IMO0001 Reserved for inherently not codable concepts without codable children: Secondary | ICD-10-CM

## 2017-01-06 DIAGNOSIS — Z6841 Body Mass Index (BMI) 40.0 and over, adult: Secondary | ICD-10-CM

## 2017-01-06 DIAGNOSIS — E669 Obesity, unspecified: Secondary | ICD-10-CM

## 2017-01-06 DIAGNOSIS — E782 Mixed hyperlipidemia: Secondary | ICD-10-CM

## 2017-01-06 DIAGNOSIS — R7989 Other specified abnormal findings of blood chemistry: Secondary | ICD-10-CM

## 2017-01-06 DIAGNOSIS — M1990 Unspecified osteoarthritis, unspecified site: Secondary | ICD-10-CM

## 2017-01-06 DIAGNOSIS — G894 Chronic pain syndrome: Secondary | ICD-10-CM | POA: Diagnosis not present

## 2017-01-06 DIAGNOSIS — R945 Abnormal results of liver function studies: Secondary | ICD-10-CM

## 2017-01-06 MED ORDER — TRAZODONE HCL 100 MG PO TABS: 200.0000 mg | ORAL_TABLET | Freq: Every evening | ORAL | 1 refills | Status: DC | PRN

## 2017-01-06 NOTE — Progress Notes (Signed)
Impression and Recommendations:    1. Mixed hyperlipidemia   2. Inflammatory arthritis   3. Class 3 obesity with serious comorbidity and body mass index (BMI) of 40.0 to 44.9 in adult, unspecified obesity type (HCC)   4. Chronic pain syndrome   5. chronic Elevated liver function tests   6. Insomnia, unspecified type   7. Essential hypertension    - We'll recheck patient's cholesterol and liver enzymes in the near future when she comes in for yearly physical, come fasting.  - Patient will follow-up with her rheumatologist, and [facility] orth[facility]ics who treats her chronic pain\inflammatory arthritis pain and neck pain.  Explained she will need to get Duexis or treatment of her chronic pain from them.   - Encouraged weight loss, walking daily to goal 510 minutes and slowly working her way up.  - Continue trazodone for sleep, encouraged sleep meditation etc.  - Blood pressure essentially well controlled for age.  Continue current medications.   Mixed hyperlipidemia Near future recheck her cholesterol level along with ALT and AST.   The patient was counseled, risk factors were discussed, anticipatory guidance given.   Modified Medications   Modified Medication Previous Medication   TRAZODONE (DESYREL) 100 MG TABLET traZODone (DESYREL) 100 MG tablet      Take 2 tablets (200 mg total) by mouth at bedtime as needed. for sleep    Take 2 tablets by mouth at bedtime as needed. for sleep    Gross side effects, risk and benefits, and alternatives of medications and treatment plan in general discussed with patient.  Patient is aware that all medications have potential side effects and we are unable to predict every side effect or drug-drug interaction that may occur.   Patient will call with any questions prior to using medication if they have concerns.  Expresses verbal understanding and consents to current therapy and treatment regimen.  No barriers to understanding were identified.   Red flag symptoms and signs discussed in detail.  Patient expressed understanding regarding what to do in case of emergency\urgent symptoms  Please see AVS handed out to patient at the end of our visit for further patient instructions/ counseling done pertaining to today's office visit.   Return for yrly physical, come fasting.     Note: This document was prepared using Dragon voice recognition software and may include unintentional dictation errors.  [name] 8:04 AM --------------------------------------------------------------------------------------------------------------------------------------------------------------------------------------------------------------------------------------------    Subjective:    CC:  Chief Complaint  Patient presents with  . Follow-up    HPI: [name] is a 61 y.o. female who presents to [facility] today for issues as discussed below.  1) Cholesterol:  Patient's dose was changed by her prior PCP on 3\22\18 from 20 mg to 10 mg.  We need to recheck in 6 months or so which would be around now.  Patient did not notice any difference in her fibromyalgia symptoms with the decrease in dose from 20-10 mg.  Also she did have a small bump her ALT at that time and this has been elevated since before 2014.  We'll recheck that as well  2) [facility] seeing patient for chronic neck pain.  Last seen in June or July of this year.  They also sent her to neurosurgery Dr. Cassandria Santee office for further assessment.    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  They have her on Duexis and was given muscle relaxor methocarbamol.     Doesn't want the surgery.   Patient went to physical therapy at [facility] orth[facility]ics and they showed her exercises and stretches she can do for her  neck pain- lasst seen around July.   --->   Rheumatoid Doc- didn't want to RF it either and both those specialists told pt to get it from her Family Doc.    -->  Last office visit for patient's chronic pain syndrome I try to start her on Neurontin to see if I could help her with her sleep, chronic pain, depression and anxiety.  Also this would help with migraines.  Patient was not able to tolerate it and said that it makes her gain weight and she did not even start it.  The physician assistant over at [facility] orthopedics who she seen for her chronic arthritic pains told her do not take the Duexis and Neurontin together.  Patient will be following up with these folks for management of her pain.  3) insomnia: trazadone-  Works well for her.  Been on it for about for one year- started by [place].  Can't sleep w/o it she says!!     4) hypertension:  Blood pressure has been well controlled in the 130s over 70s and 80s.      Problem  Mixed Hyperlipidemia     Wt Readings from Last 3 Encounters:  01/18/17 214 lb 12.8 oz (97.4 kg)  01/06/17 210 lb 12.8 oz (95.6 kg)  12/28/16 213 lb 1.6 oz (96.7 kg)   BP Readings from Last 3 Encounters:  01/18/17 123/80  01/06/17 138/85  12/28/16 117/72   Pulse Readings from Last 3 Encounters:  01/18/17 80  01/06/17 89  12/28/16 74   BMI Readings from Last 3 Encounters:  01/18/17 39.29 kg/m  01/06/17 38.56 kg/m  12/28/16 38.98 kg/m     Patient Care Team    Relationship Specialty Notifications Start End  Thomasene Lot, DO PCP - General Family Medicine  03/11/16   Hilda Lias, MD Consulting Physician Neurosurgery  03/11/16   Rossie Muskrat, MD Consulting Physician Rheumatology  03/11/16   Claria Dice, MD Attending Physician Physical Medicine and Rehabilitation  10/04/16    Comment: INjections in back/ neck-- part of [facility] Consulting Physician Specialist  01/06/17    Comment: treating her for chronic neck pain- last seen  this june/ july.   [facility] Consulting Physician Dermatology  01/18/17      Patient Active Problem List   Diagnosis Date Noted  . Obesity 03/11/2016    Priority: High  . Mixed hyperlipidemia 03/11/2016    Priority: High  . Essential hypertension 06/16/2012    Priority: High  . Chronic pain syndrome 10/04/2016    Priority: Medium  . Gastroesophageal reflux disease 03/14/2016    Priority: Medium  . Iron (Fe) deficiency anemia 03/14/2016    Priority: Medium  . Major depressive disorder, single episode, severe without psychotic features (HCC)     Priority: Medium  . Fibromyalgia 02/19/2014    Priority: Medium  . Inflammatory arthritis 09/12/2013    Priority: Medium  . Migraine, unspecified, without mention of intractable migraine without mention of status migrainosus 06/16/2012    Priority: Medium  . h/o Elevated fasting blood sugar 10/04/2016    Priority: Low  . chronic Elevated liver function tests  Priority: Low  . Family history of diabetes mellitus 07/19/2016    Priority: Low  . Environmental and seasonal allergies 03/14/2016    Priority: Low  . Generalized anxiety disorder 08/23/2012    Priority: Low  . Insomnia 08/14/2012    Priority: Low  . Hx of total hysterectomy 01/18/2017  . Thoracic back pain 12/28/2016  . Encounter for long-term (current) use of high-risk medication 11/08/2015  . 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 Hypokalemia 09/18/2013  . Family history of ischemic heart disease   . Episode of dizziness after NTG while walking to [place] 09/17/2013    Past Medical history, Surgical history, Family history, Social history, Allergies and Medications have been entered into the medical record, reviewed and changed as needed.    Current Meds  Medication Sig  . aspirin EC 81 MG tablet Take 1 tablet (81 mg total) by mouth daily. For health heallth  . atorvastatin (LIPITOR) 10 MG tablet Take 1 tablet (10 mg total) by mouth every  evening. For High cholesterol/fat  . DULoxetine (CYMBALTA) 30 MG capsule TAKE 1 CAPSULE BY MOUTH 2 TIMES DAILY FOR DEPRESSION  . ENBREL SURECLICK 50 MG/ML injection Inject 1 mL into the skin once a week.  . ferrous sulfate 325 (65 FE) MG tablet Take 700 mg by mouth daily with breakfast.  . hydrochlorothiazide (HYDRODIURIL) 25 MG tablet Take 1 tablet (25 mg total) by mouth daily. For high blood pressure  . Ibuprofen-Famotidine (DUEXIS) 800-26.6 MG TABS Take by mouth as directed.  . loratadine (CLARITIN) 10 MG tablet Take 1 tablet (10 mg total) by mouth daily.  Marland Kitchen losartan (COZAAR) 50 MG tablet TAKE 1 TABLET BY MOUTH EVERY DAY  . Multiple Vitamin (MULTIVITAMIN) tablet Take 1 tablet by mouth daily.  . ondansetron (ZOFRAN-ODT) 4 MG disintegrating tablet Take 1 tablet by mouth as needed.  . traZODone (DESYREL) 100 MG tablet Take 2 tablets (200 mg total) by mouth at bedtime as needed. for sleep  . vitamin C (ASCORBIC ACID) 500 MG tablet Take 500 mg by mouth daily.  . [DISCONTINUED] predniSONE (DELTASONE) 20 MG tablet Take 1 tablet (20 mg total) by mouth 2 (two) times daily with a meal.  . [DISCONTINUED] traZODone (DESYREL) 100 MG tablet Take 2 tablets by mouth at bedtime as needed. for sleep    Allergies:  Allergies  Allergen Reactions  . Hydrocodone Itching     Review of Systems: General:   Denies fever, chills, unexplained weight loss.  Optho/Auditory:   Denies visual changes, blurred vision/LOV Respiratory:   Denies wheeze, DOE more than baseline levels.  Cardiovascular:   Denies chest pain, palpitations, new onset peripheral edema  Gastrointestinal:   Denies nausea, vomiting, diarrhea, abd pain.  Genitourinary: Denies dysuria, freq/ urgency, flank pain or discharge from genitals.  Endocrine:     Denies hot or cold intolerance, polyuria, polydipsia. Musculoskeletal:   Denies unexplained myalgias, joint swelling, unexplained arthralgias, gait problems.  Skin:  Denies new onset rash,  suspicious lesions Neurological:     Denies dizziness, unexplained weakness, numbness  Psychiatric/Behavioral:   Denies mood changes, suicidal or homicidal ideations, hallucinations    Objective:   Blood pressure 138/85, pulse 89, height 5\' 2"  (1.575 m), weight 210 lb 12.8 oz (95.6 kg). Body mass index is 38.56 kg/m. General:  Well Developed, well nourished, appropriate for stated age.  Neuro:  Alert and oriented,  extra-ocular muscles intact  HEENT:  Normocephalic, atraumatic, neck supple, no carotid bruits appreciated  Skin:  no gross  rash, warm, pink. Cardiac:  RRR, S1 S2 Respiratory:  ECTA B/L and A/P, Not using accessory muscles, speaking in full sentences- unlabored. Vascular:  Ext warm, no cyanosis apprec.; cap RF less 2 sec. Psych:  No HI/SI, judgement and insight good, Euthymic mood. Full Affect.

## 2017-01-06 NOTE — Assessment & Plan Note (Signed)
Near future recheck her cholesterol level along with ALT and AST.

## 2017-01-06 NOTE — Patient Instructions (Addendum)
In the near come in for a yearly physical, come fasting so we can get a full blood work.      Please realize, EXERCISE IS MEDICINE!  -  American Heart Association [facility]) guidelines for exercise : If you are in good health, without any medical conditions, you should engage in 150 minutes of moderate intensity aerobic activity per week.  This means you should be huffing and puffing throughout your workout.   Engaging in regular exercise will improve brain function and memory, as well as improve mood, boost immune system and help with weight management.  As well as the other, more well-known effects of exercise such as decreasing blood sugar levels, decreasing blood pressure,  and decreasing bad cholesterol levels/ increasing good cholesterol levels.     -  The AHA strongly endorses consumption of a diet that contains a variety of foods from all the food categories with an emphasis on fruits and vegetables; fat-free and low-fat dairy products; cereal and grain products; legumes and nuts; and fish, poultry, and/or extra lean meats.    Excessive food intake, especially of foods high in saturated and trans fats, sugar, and salt, should be avoided.    Adequate water intake of roughly 1/2 of your weight in pounds, should equal the ounces of water per day you should drink.  So for instance, if you're 200 pounds, that would be 100 ounces of water per day.         Mediterranean Diet  Why follow it? Research shows. . Those who follow the Mediterranean diet have a reduced risk of heart disease  . The diet is associated with a reduced incidence of Parkinson's and Alzheimer's diseases . People following the diet may have longer life expectancies and lower rates of chronic diseases  . The Dietary Guidelines for Americans recommends the Mediterranean diet as an eating plan to promote health and prevent disease  What Is the Mediterranean Diet?  . Healthy eating plan based on typical foods and recipes of  Mediterranean-style cooking . The diet is primarily a plant based diet; these foods should make up a majority of meals   Starches - Plant based foods should make up a majority of meals - They are an important sources of vitamins, minerals, energy, antioxidants, and fiber - Choose whole grains, foods high in fiber and minimally processed items  - Typical grain sources include wheat, oats, barley, corn, brown rice, bulgar, farro, millet, polenta, couscous  - Various types of beans include chickpeas, lentils, fava beans, black beans, white beans   Fruits  Veggies - Large quantities of antioxidant rich fruits & veggies; 6 or more servings  - Vegetables can be eaten raw or lightly drizzled with oil and cooked  - Vegetables common to the traditional Mediterranean Diet include: artichokes, arugula, beets, broccoli, brussel sprouts, cabbage, carrots, celery, collard greens, cucumbers, eggplant, kale, leeks, lemons, lettuce, mushrooms, okra, onions, peas, peppers, potatoes, pumpkin, radishes, rutabaga, shallots, spinach, sweet potatoes, turnips, zucchini - Fruits common to the Mediterranean Diet include: apples, apricots, avocados, cherries, clementines, dates, figs, grapefruits, grapes, melons, nectarines, oranges, peaches, pears, pomegranates, strawberries, tangerines  Fats - Replace butter and margarine with healthy oils, such as olive oil, canola oil, and tahini  - Limit nuts to no more than a handful a day  - Nuts include walnuts, almonds, pecans, pistachios, pine nuts  - Limit or avoid candied, honey roasted or heavily salted nuts - Olives are central to the Mediterranean diet - can be eaten  whole or used in a variety of dishes   Meats Protein - Limiting red meat: no more than a few times a month - When eating red meat: choose lean cuts and keep the portion to the size of deck of cards - Eggs: approx. 0 to 4 times a week  - Fish and lean poultry: at least 2 a week  - Healthy protein sources  include, chicken, [place], lean beef, lamb - Increase intake of seafood such as tuna, salmon, trout, mackerel, shrimp, scallops - Avoid or limit high fat processed meats such as sausage and bacon  Dairy - Include moderate amounts of low fat dairy products  - Focus on healthy dairy such as fat free yogurt, skim milk, low or reduced fat cheese - Limit dairy products higher in fat such as whole or 2% milk, cheese, ice cream  Alcohol - Moderate amounts of red wine is ok  - No more than 5 oz daily for women (all ages) and men older than age 32  - No more than 10 oz of wine daily for men younger than 59  Other - Limit sweets and other desserts  - Use herbs and spices instead of salt to flavor foods  - Herbs and spices common to the traditional Mediterranean Diet include: basil, bay leaves, chives, cloves, cumin, fennel, garlic, lavender, marjoram, mint, oregano, parsley, pepper, rosemary, sage, savory, sumac, tarragon, thyme   It's not just a diet, it's a lifestyle:  . The Mediterranean diet includes lifestyle factors typical of those in the region  . Foods, drinks and meals are best eaten with others and savored . Daily physical activity is important for overall good health . This could be strenuous exercise like running and aerobics . This could also be more leisurely activities such as walking, housework, yard-work, or taking the stairs . Moderation is the key; a balanced and healthy diet accommodates most foods and drinks . Consider portion sizes and frequency of consumption of certain foods   Meal Ideas & Options:  . Breakfast:  o Whole wheat toast or whole wheat English muffins with peanut butter & hard boiled egg o Steel cut oats topped with apples & cinnamon and skim milk  o Fresh fruit: banana, strawberries, melon, berries, peaches  o Smoothies: strawberries, bananas, greek yogurt, peanut butter o Low fat greek yogurt with blueberries and granola  o Egg white omelet with spinach and  mushrooms o Breakfast couscous: whole wheat couscous, apricots, skim milk, cranberries  . Sandwiches:  o Hummus and grilled vegetables (peppers, zucchini, squash) on whole wheat bread   o Grilled chicken on whole wheat pita with lettuce, tomatoes, cucumbers or tzatziki  o Tuna salad on whole wheat bread: tuna salad made with greek yogurt, olives, red peppers, capers, green onions o Garlic rosemary lamb pita: lamb sauted with garlic, rosemary, salt & pepper; add lettuce, cucumber, greek yogurt to pita - flavor with lemon juice and black pepper  . Seafood:  o Mediterranean grilled salmon, seasoned with garlic, basil, parsley, lemon juice and black pepper o Shrimp, lemon, and spinach whole-grain pasta salad made with low fat greek yogurt  o Seared scallops with lemon orzo  o Seared tuna steaks seasoned salt, pepper, coriander topped with tomato mixture of olives, tomatoes, olive oil, minced garlic, parsley, green onions and cappers  . Meats:  o Herbed greek chicken salad with kalamata olives, cucumber, feta  o Red bell peppers stuffed with spinach, bulgur, lean ground beef (or lentils) & topped with  feta   o Kebabs: skewers of chicken, tomatoes, onions, zucchini, squash  o [place] burgers: made with red onions, mint, dill, lemon juice, feta cheese topped with roasted red peppers . Vegetarian o Cucumber salad: cucumbers, artichoke hearts, celery, red onion, feta cheese, tossed in olive oil & lemon juice  o Hummus and whole grain pita points with a greek salad (lettuce, tomato, feta, olives, cucumbers, red onion) o Lentil soup with celery, carrots made with vegetable broth, garlic, salt and pepper  o Tabouli salad: parsley, bulgur, mint, scallions, cucumbers, tomato, radishes, lemon juice, olive oil, salt and pepper.    - Guidelines for a Low Cholesterol, Low Saturated Fat Diet   Fats - Limit total intake of fats and oils. - Avoid butter, stick margarine, shortening, lard, palm and coconut  oils. - Limit mayonnaise, salad dressings, gravies and sauces, unless they are homemade with low-fat ingredients. - Limit chocolate. - Choose low-fat and nonfat products, such as low-fat mayonnaise, low-fat or non-hydrogenated peanut butter, low-fat or fat-free salad dressings and nonfat gravy. - Use vegetable oil, such as canola or olive oil. - Look for margarine that does not contain trans fatty acids. - Use nuts in moderate amounts. - Read ingredient labels carefully to determine both amount and type of fat present in foods. Limit saturated and trans fats! - Avoid high-fat processed and convenience foods.  Meats and Meat Alternatives - Choose fish, chicken, [place] and lean meats. - Use dried beans, peas, lentils and tofu. - Limit egg yolks to three to four per week. - If you eat red meat, limit to no more than three servings per week and choose loin or round cuts. - Avoid fatty meats, such as bacon, sausage, franks, luncheon meats and ribs. - Avoid all organ meats, including liver.  Dairy - Choose nonfat or low-fat milk, yogurt and cottage cheese. - Most cheeses are high in fat. Choose cheeses made from non-fat milk, such as mozzarella and ricotta cheese. - Choose light or fat-free cream cheese and sour cream. - Avoid cream and sauces made with cream.  Fruits and Vegetables - Eat a wide variety of fruits and vegetables. - Use lemon juice, vinegar or "mist" olive oil on vegetables. - Avoid adding sauces, fat or oil to vegetables.  Breads, Cereals and Grains - Choose whole-grain breads, cereals, pastas and rice. - Avoid high-fat snack foods, such as granola, cookies, pies, pastries, doughnuts and croissants.  Cooking Tips - Avoid deep fried foods. - Trim visible fat off meats and remove skin from poultry before cooking. - Bake, broil, boil, poach or roast poultry, fish and lean meats. - Drain and discard fat that drains out of meat as you cook it. - Add little or no fat to  foods. - Use vegetable oil sprays to grease pans for cooking or baking. - Steam vegetables. - Use herbs or no-oil marinades to flavor foods.

## 2017-01-12 ENCOUNTER — Other Ambulatory Visit: Payer: Self-pay | Admitting: Adult Health

## 2017-01-18 ENCOUNTER — Encounter: Payer: Self-pay | Admitting: Family Medicine

## 2017-01-18 ENCOUNTER — Ambulatory Visit (INDEPENDENT_AMBULATORY_CARE_PROVIDER_SITE_OTHER): Payer: BLUE CROSS/BLUE SHIELD | Admitting: Family Medicine

## 2017-01-18 VITALS — BP 123/80 | HR 80 | Ht 62.0 in | Wt 214.8 lb

## 2017-01-18 DIAGNOSIS — Z79899 Other long term (current) drug therapy: Secondary | ICD-10-CM

## 2017-01-18 DIAGNOSIS — Z0000 Encounter for general adult medical examination without abnormal findings: Secondary | ICD-10-CM | POA: Diagnosis not present

## 2017-01-18 DIAGNOSIS — R7301 Impaired fasting glucose: Secondary | ICD-10-CM

## 2017-01-18 DIAGNOSIS — Z90710 Acquired absence of both cervix and uterus: Secondary | ICD-10-CM | POA: Diagnosis not present

## 2017-01-18 DIAGNOSIS — R7989 Other specified abnormal findings of blood chemistry: Secondary | ICD-10-CM

## 2017-01-18 DIAGNOSIS — Z1389 Encounter for screening for other disorder: Secondary | ICD-10-CM

## 2017-01-18 DIAGNOSIS — I10 Essential (primary) hypertension: Secondary | ICD-10-CM

## 2017-01-18 DIAGNOSIS — M1990 Unspecified osteoarthritis, unspecified site: Secondary | ICD-10-CM

## 2017-01-18 DIAGNOSIS — D509 Iron deficiency anemia, unspecified: Secondary | ICD-10-CM

## 2017-01-18 DIAGNOSIS — E782 Mixed hyperlipidemia: Secondary | ICD-10-CM

## 2017-01-18 DIAGNOSIS — Z6841 Body Mass Index (BMI) 40.0 and over, adult: Secondary | ICD-10-CM

## 2017-01-18 DIAGNOSIS — E669 Obesity, unspecified: Secondary | ICD-10-CM

## 2017-01-18 NOTE — Progress Notes (Signed)
Impression and Recommendations:    1. Encounter for wellness examination   2. Screening for multiple conditions   3. Hx of total hysterectomy   4. Essential hypertension   5. Mixed hyperlipidemia   6. Class 3 obesity with serious comorbidity and body mass index (BMI) of 40.0 to 44.9 in adult, unspecified obesity type (HCC)   7. Inflammatory arthritis   8. Iron deficiency anemia, unspecified iron deficiency anemia type   9. h/o Elevated fasting blood sugar   10. chronic Elevated liver function tests   11. Encounter for long-term (current) use of high-risk medication     Please see orders section below for further details of actions taken during this office visit.  Gross side effects, risk and benefits, and alternatives of medications discussed with patient.  Patient is aware that all medications have potential side effects and we are unable to predict every side effect or drug-drug interaction that may occur.  Expresses verbal understanding and consents to current therapy plan and treatment regiment.  1) Anticipatory Guidance: Discussed importance of wearing a seatbelt while driving, not texting while driving; sunscreen when outside along with yearly skin surveillance; eating a well balanced and modest diet; physical activity at least 25 minutes per day or 150 min/ week of moderate to intense activity.  2) Immunizations / Screenings / Labs:  All immunizations and screenings that patient agrees to, are up-to-date per recommendations or will be updated today.  Patient understands the needs for q 23mo dental and yearly vision screens which pt will schedule independently.  - She will call to schedule her mammogram which she is due in the near future in approximate one month. - Obtain CBC, CMP, HgA1c, Lipid panel, TSH and vit D when fasting if not already done recently.   3) Weight:   Discussed goal of losing even 5-10% of current body weight which would improve overall feelings of well  being and improve objective health data significantly.   Improve nutrient density of diet through increasing intake of fruits and vegetables and decreasing saturated/trans fats, white flour products and refined sugar products.   F-up preventative CPE in 1 year. F/up sooner for chronic care management as discussed and/or prn.   Orders Placed This Encounter  Procedures  . CBC With Differential  . Comprehensive metabolic panel  . Hemoglobin A1c  . Lipid panel  . VITAMIN D 25 Hydroxy (Vit-D Deficiency, Fractures)  . TSH  . T4, free  . Vitamin B12  . Ferritin  . Iron  . Iron Binding Cap (TIBC)    Please see orders placed and AVS handed out to patient at the end of our visit for further patient instructions/ counseling done pertaining to today's office visit.     Subjective:    Chief Complaint  Patient presents with  . Annual Exam   CC:   HPI: [name] is a 61 y.o. female who presents to [facility] today a yearly health maintenance exam.  Health Maintenance Summary Reviewed and updated, unless pt declines services.   Colonoscopy:     (UTD) Tdap: Up to date Pneumovax/PPSV23:  Up to date: Per patient she had a pneumonia vaccine couple of years ago at another practice.   Zostavax:    Patient says she had this at the practice a couple of years ago-we will track down records Tobacco History Reviewed:   Y  CT scan for screening lung CA:   Not applicable Abdominal Ultrasound:     (  Unnecessary ) Alcohol:    No concerns, no excessive use Exercise Habits:   Not meeting AHA guidelines- limited by her chronic pain STD concerns:   none Drug Use:   None Birth control method:   n/a Menses regular:     n/a Lumps or breast concerns:      no Breast Cancer Family History:      No Bone/ DEXA scan:     ( Unnecessary due to < 65 and average risk)   Health Maintenance  Topic Date Due  . INFLUENZA VACCINE  01/31/2017 (Originally 12/01/2016)  . HIV Screening   05/03/2020 (Originally 07/22/1970)  . MAMMOGRAM  02/16/2018  . COLONOSCOPY  09/28/2022  . TETANUS/TDAP  10/04/2024  . Hepatitis C Screening  Completed     Wt Readings from Last 3 Encounters:  01/18/17 214 lb 12.8 oz (97.4 kg)  01/06/17 210 lb 12.8 oz (95.6 kg)  12/28/16 213 lb 1.6 oz (96.7 kg)   BP Readings from Last 3 Encounters:  01/18/17 123/80  01/06/17 138/85  12/28/16 117/72   Pulse Readings from Last 3 Encounters:  01/18/17 80  01/06/17 89  12/28/16 74     Past Medical History:  Diagnosis Date  . Allergy   . Anxiety    takes Citalopram daily  . Arthritis   . Chronic back pain    radiculopathy  . Fibromyalgia   . GERD (gastroesophageal reflux disease)    takes Omeprazole daily  . History of bronchitis    last yr  . History of shingles   . Hyperlipidemia    takes Atorvastatin daily  . Hypertension    takes HCTZ daily as well as Propranolol  . Insomnia    takes trazodone nightly  . Joint pain   . Joint swelling   . Migraine    hx of  . Muscle spasm of back    takes Flexeril daily as needed  . Obesity   . Pneumonia    hx of-many,many yrs ago  . PONV (postoperative nausea and vomiting)    violent nausea/vomiting  . Rheumatoid arthritis (HCC)   . Seasonal allergies    takes Claritin daily  . Urinary urgency       Past Surgical History:  Procedure Laterality Date  . ABDOMINAL HYSTERECTOMY  2003  . ABDOMINOPLASTY  07/2015  . BACK SURGERY    . CARPAL TUNNEL RELEASE Bilateral   . CHOLECYSTECTOMY N/A 06/27/2012   Procedure: LAPAROSCOPIC CHOLECYSTECTOMY WITH INTRAOPERATIVE CHOLANGIOGRAM;  Surgeon: Currie Paris, MD;  Location: [facility];  Service: General;  Laterality: N/A;  . COLONOSCOPY    . LUMBAR LAMINECTOMY/DECOMPRESSION MICRODISCECTOMY Right 04/23/2014   Procedure: Right L5-S1 Laminectomy/Foraminotomy;  Surgeon: Karn Cassis, MD;  Location: [facility];  Service: Neurosurgery;  Laterality: Right;  Right L5-S1 Laminectomy/Foraminotomy  .  LUMBAR WOUND DEBRIDEMENT N/A 05/01/2014   Procedure: EXPLORATION OF LUMBAR WOUND;  Surgeon: Karn Cassis, MD;  Location: [facility];  Service: Neurosurgery;  Laterality: N/A;  EXPLORATION OF LUMBAR WOUND  . LUMBAR WOUND DEBRIDEMENT N/A 05/08/2014   Procedure: LUMBAR WOUND Exploration;  Surgeon: Karn Cassis, MD;  Location: [facility];  Service: Neurosurgery;  Laterality: N/A;  . NECK SURGERY        Family History  Problem Relation Age of Onset  . Leukemia Father   . Cancer Father        leukemia  . Diabetes Maternal Grandmother   . Rheum arthritis Maternal Grandmother   . Heart disease  Paternal Grandmother   . Rheum arthritis Mother   . Heart disease Mother        2 coronary stents  . Heart disease Maternal Uncle   . Miscarriages / Stillbirths Maternal Uncle   . Heart disease Maternal Uncle   . Colon cancer Paternal Aunt 10  . Heart disease Maternal Aunt   . Heart disease Maternal Aunt       History  Drug Use No  ,   History  Alcohol Use No  ,   History  Smoking Status  . Former Smoker  . Packs/day: 0.50  . Years: 10.00  . Types: Cigarettes  . Quit date: 05/03/1996  Smokeless Tobacco  . Never Used    Comment: quit smoking 37yrs ago  ,   History  Sexual Activity  . Sexual activity: Not Currently  . Birth control/ protection: Surgical    Current Outpatient Prescriptions on File Prior to Visit  Medication Sig Dispense Refill  . aspirin EC 81 MG tablet Take 1 tablet (81 mg total) by mouth daily. For health heallth    . atorvastatin (LIPITOR) 10 MG tablet Take 1 tablet (10 mg total) by mouth every evening. For High cholesterol/fat 90 tablet 1  . cyclobenzaprine (FLEXERIL) 10 MG tablet Take 1 tablet (10 mg total) by mouth at bedtime. (Patient taking differently: Take 10 mg by mouth at bedtime. As needed) 20 tablet 0  . DULoxetine (CYMBALTA) 30 MG capsule TAKE 1 CAPSULE BY MOUTH 2 TIMES DAILY FOR DEPRESSION 180 capsule 0  . ENBREL SURECLICK 50 MG/ML  injection Inject 1 mL into the skin once a week.  2  . ferrous sulfate 325 (65 FE) MG tablet Take 700 mg by mouth daily with breakfast.    . hydrochlorothiazide (HYDRODIURIL) 25 MG tablet Take 1 tablet (25 mg total) by mouth daily. For high blood pressure 90 tablet 3  . Ibuprofen-Famotidine (DUEXIS) 800-26.6 MG TABS Take by mouth as directed.    . loratadine (CLARITIN) 10 MG tablet Take 1 tablet (10 mg total) by mouth daily. 90 tablet 3  . losartan (COZAAR) 50 MG tablet TAKE 1 TABLET BY MOUTH EVERY DAY 90 tablet 0  . methocarbamol (ROBAXIN) 500 MG tablet Take 500 mg by mouth every 6 (six) hours as needed for muscle spasms.    . Multiple Vitamin (MULTIVITAMIN) tablet Take 1 tablet by mouth daily.    . ondansetron (ZOFRAN-ODT) 4 MG disintegrating tablet Take 1 tablet by mouth as needed.  0  . traZODone (DESYREL) 100 MG tablet Take 2 tablets (200 mg total) by mouth at bedtime as needed. for sleep 180 tablet 1  . vitamin C (ASCORBIC ACID) 500 MG tablet Take 500 mg by mouth daily.     No current facility-administered medications on file prior to visit.     Allergies: Hydrocodone  Review of Systems: General:   Denies fever, chills, unexplained weight loss.  Optho/Auditory:   Denies visual changes, blurred vision/LOV Respiratory:   Denies SOB, DOE more than baseline levels.  Cardiovascular:   Denies chest pain, palpitations, new onset peripheral edema  Gastrointestinal:   Denies nausea, vomiting, diarrhea.  Genitourinary: Denies dysuria, freq/ urgency, flank pain or discharge from genitals.  Endocrine:     Denies hot or cold intolerance, polyuria, polydipsia. Musculoskeletal:   Denies unexplained myalgias, joint swelling, unexplained arthralgias, gait problems.  Skin:  Denies rash, suspicious lesions Neurological:     Denies dizziness, unexplained weakness, numbness  Psychiatric/Behavioral:   Denies mood changes, suicidal or  homicidal ideations, hallucinations    Objective:    Blood  pressure 123/80, pulse 80, height 5\' 2"  (1.575 m), weight 214 lb 12.8 oz (97.4 kg). Body mass index is 39.29 kg/m. General Appearance:    Alert, cooperative, no distress, appears stated age  Head:    Normocephalic, without obvious abnormality, atraumatic  Eyes:    PERRL, conjunctiva/corneas clear, EOM's intact, fundi    benign, both eyes  Ears:    Normal TM's and external ear canals, both ears  Nose:   Nares normal, septum midline, mucosa normal, no drainage    or sinus tenderness  Throat:   Lips w/o lesion, mucosa moist, and tongue normal; teeth and   gums normal  Neck:   Supple, symmetrical, trachea midline, no adenopathy;    thyroid:  no enlargement/tenderness/nodules; no carotid   bruit or JVD  Back:     Symmetric, no curvature, ROM normal, no CVA tenderness  Lungs:     Clear to auscultation bilaterally, respirations unlabored, no       Wh/ R/ R  Chest Wall:    No tenderness or gross deformity; normal excursion   Heart:    Regular rate and rhythm, S1 and S2 normal, no murmur, rub   or gallop  Breast Exam:    No tenderness, masses, or nipple abnormality b/l; no d/c  Abdomen:     Soft, non-tender, bowel sounds active all four quadrants, NO   G/R/R, no masses, no organomegaly  Genitalia:  Deferred by pt  Rectal:  deferred   Extremities:   Extremities normal, atraumatic, no cyanosis or gross edema  Pulses:   2+ and symmetric all extremities  Skin:   Warm, dry, Skin color, texture, turgor normal, no obvious rashes or lesions Psych: No HI/SI, judgement and insight good, Euthymic mood. Full Affect.  Neurologic:   CNII-XII intact, normal strength, sensation and reflexes    Throughout

## 2017-01-18 NOTE — Patient Instructions (Signed)
Please give patient stool cards to do at home.    Melissa please call her prior practice and get documentation of her shingles vaccine as well as her pneumonia vaccine.    Please call for your mammogram appointment.     Preventive Care for Adults  A healthy lifestyle and preventive care can promote health and wellness. Preventive health guidelines for men include the following key practices:  .   A routine yearly physical is a good way to check with your health care provider about your health and preventative screening. It is a chance to share any concerns and updates on your health and to receive a thorough exam.  .  Visit your dentist for a routine exam and preventative care every 6 months. Brush your teeth twice a day and floss once a day. Good oral hygiene prevents tooth decay and gum disease.  .  The frequency of eye exams is based on your age, health, family medical history, use of contact lenses, and other factors.  Follow your health care provider's recommendations for frequency of eye exams.  .  Eat a healthy diet.  Foods such as vegetables, fruits, whole grains, low-fat dairy products, and lean protein foods contain the nutrients you need without too many calories.  Decrease your intake of foods high in solid fats, added sugars, and salt.  Eat the right amount of calories for you.  Get information about a proper diet from your health care provider, if necessary.  .  Regular physical exercise is one of the most important things you can do for your health.  Most adults should get at least 150 minutes of moderate-intensity exercise (any activity that increases your heart rate and causes you to sweat) each week.  In addition, most adults need muscle-strengthening exercises on 2 or more days a week.  .  Maintain a healthy weight. The body mass index (BMI) is a screening tool to identify possible weight problems. It provides an estimate of body fat based on height and weight. Your  health care provider can find your BMI and can help you achieve or maintain a healthy weight. For adults 20 years and older: A BMI below 18.5 is considered underweight. A BMI of 18.5 to 24.9 is normal. A BMI of 25 to 29.9 is considered overweight. A BMI of 30 and above is considered obese.  .  Maintain normal blood lipids and cholesterol levels by exercising and minimizing your intake of saturated fat. Eat a balanced diet with plenty of fruit and vegetables. Blood tests for lipids and cholesterol should begin at age 3 and be repeated every 5 years. If your lipid or cholesterol levels are high, you are over 50, or you are at high risk for heart disease, you may need your cholesterol levels checked more frequently. Ongoing high lipid and cholesterol levels should be treated with medicines if diet and exercise are not working.  .  If you smoke, find out from your health care provider how to quit. If you do not use tobacco, do not start.  . If you choose to drink alcohol, do not have more than 2 drinks per day. One drink is considered to be 12 ounces (355 mL) of beer, 5 ounces (148 mL) of wine, or 1.5 ounces (44 mL) of liquor.  Marland Kitchen Avoid use of street drugs. Do not share needles with anyone. Ask for help if you need support or instructions about stopping the use of drugs.  . High blood pressure causes  heart disease and increases the risk of stroke. Your blood pressure should be checked at least every 1-2 years. Ongoing high blood pressure should be treated with medicines, if weight loss and exercise are not effective.  . If you are 72-19 years old, ask your health care provider if you should take aspirin to prevent heart disease.  . Diabetes screening involves taking a blood sample to check your fasting blood sugar level.  This should be done once every 3 years, after age 82, if you are within normal weight and without risk factors for diabetes.  Testing should be considered at a younger age or  be carried out more frequently if you are overweight and have at least 1 risk factor for diabetes.  . Colorectal cancer can be detected and often prevented. Most routine colorectal cancer screening begins at the age of 83 and continues through age 87. However, your health care provider may recommend screening at an earlier age if you have risk factors for colon cancer. On a yearly basis, your health care provider may provide home test kits to check for hidden blood in the stool. Use of a small camera at the end of a tube to directly examine the colon (sigmoidoscopy or colonoscopy) can detect the earliest forms of colorectal cancer. Talk to your health care provider about this at age 41, when routine screening begins. Direct exam of the colon should be repeated every 5-10 years through age 29, unless early forms of precancerous polyps or small growths are found.  .  Lung cancer screening is recommended for adults aged 55-80 years who are at high risk for developing lung cancer because of a history of smoking. A yearly low-dose CT scan of the lungs is recommended for people who have at least a 30-pack-year history of smoking and are a current smoker or have quit within the past 15 years. A pack year of smoking is smoking an average of 1 pack of cigarettes a day for 1 year (for example: 1 pack a day for 30 years or 2 packs a day for 15 years). Yearly screening should continue until the smoker has stopped smoking for at least 15 years. Yearly screening should be stopped for people who develop a health problem that would prevent them from having lung cancer treatment.  . Talk with your health care provider about prostate cancer screening.  . Testicular cancer screening is recommended for adult males. Screening includes self-exam and a health care provider exam. Consult with your health care provider about any symptoms you have or any concerns you have about testicular cancer.  . Use sunscreen. Apply  sunscreen liberally and repeatedly throughout the day. You should seek shade when your shadow is shorter than you. Protect yourself by wearing long sleeves, pants, a wide-brimmed hat, and sunglasses year round, whenever you are outdoors.  . Once a month, do a whole-body skin exam, using a mirror to look at the skin on your back. Tell your health care provider about new moles, moles that have irregular borders, moles that are larger than a pencil eraser, or moles that have changed in shape or color.    ++++++++++++++++++++++++++++++++++++++++++++++++++++++++++++++++++  Stay current with required vaccines (immunizations).  ? Influenza vaccine. All adults should be immunized every year.  ? Tetanus, diphtheria, and acellular pertussis (Td, Tdap) vaccine. An adult who has not previously received Tdap or who does not know his vaccine status should receive 1 dose of Tdap. This initial dose should be followed by tetanus  and diphtheria toxoids (Td) booster doses every 10 years. Adults with an unknown or incomplete history of completing a 3-dose immunization series with Td-containing vaccines should begin or complete a primary immunization series including a Tdap dose. Adults should receive a Td booster every 10 years.  ? Varicella vaccine. An adult without evidence of immunity to varicella should receive 2 doses or a second dose if he has previously received 1 dose.  ? Human papillomavirus (HPV) vaccine. Males aged 13-21 years who have not received the vaccine previously should receive the 3-dose series. Males aged 22-26 years may be immunized. Immunization is recommended through the age of 6 years for any female who has sex with males and did not get any or all doses earlier. Immunization is recommended for any person with an immunocompromised condition through the age of 26 years if he did not get any or all doses earlier. During the 3-dose series, the second dose should be obtained 4-8 weeks after the  first dose. The third dose should be obtained 24 weeks after the first dose and 16 weeks after the second dose.  ? Zoster vaccine. One dose is recommended for adults aged 28 years or older unless certain conditions are present.   ? PREVNAR - Pneumococcal 13-valent conjugate (PCV13) vaccine. When indicated, a person who is uncertain of his immunization history and has no record of immunization should receive the PCV13 vaccine. An adult aged 73 years or older who has certain medical conditions and has not been previously immunized should receive 1 dose of PCV13 vaccine. This PCV13 should be followed with a dose of pneumococcal polysaccharide (PPSV23) vaccine. The PPSV23 vaccine dose should be obtained at least 8 weeks after the dose of PCV13 vaccine. An adult aged 53 years or older who has certain medical conditions and previously received 1 or more doses of PPSV23 vaccine should receive 1 dose of PCV13. The PCV13 vaccine dose should be obtained 1 or more years after the last PPSV23 vaccine dose.   ? PNEUMOVAX - Pneumococcal polysaccharide (PPSV23) vaccine. When PCV13 is also indicated, PCV13 should be obtained first. All adults aged 29 years and older should be immunized. An adult younger than age 42 years who has certain medical conditions should be immunized. Any person who resides in a nursing home or long-term care facility should be immunized. An adult smoker should be immunized. People with an immunocompromised condition and certain other conditions should receive both PCV13 and PPSV23 vaccines. People with human immunodeficiency virus (HIV) infection should be immunized as soon as possible after diagnosis. Immunization during chemotherapy or radiation therapy should be avoided. Routine use of PPSV23 vaccine is not recommended for American Indians, [address], or people younger than 65 years unless there are medical conditions that require PPSV23 vaccine. When indicated, people who have unknown  immunization and have no record of immunization should receive PPSV23 vaccine. One-time revaccination 5 years after the first dose of PPSV23 is recommended for people aged 19-64 years who have chronic kidney failure, nephrotic syndrome, asplenia, or immunocompromised conditions. People who received 1-2 doses of PPSV23 before age 90 years should receive another dose of PPSV23 vaccine at age 58 years or later if at least 5 years have passed since the previous dose. Doses of PPSV23 are not needed for people immunized with PPSV23 at or after age 25 years.   ? Hepatitis A vaccine. Adults who wish to be protected from this disease, have certain high-risk conditions, work with hepatitis A-infected animals, work in  hepatitis A research labs, or travel to or work in countries with a high rate of hepatitis A should be immunized. Adults who were previously unvaccinated and who anticipate close contact with an international adoptee during the first 60 days after arrival in the [place] from a country with a high rate of hepatitis A should be immunized.  ? Hepatitis B vaccine. Adults should be immunized if they wish to be protected from this disease, have certain high-risk conditions, may be exposed to blood or other infectious body fluids, are household contacts or sex partners of hepatitis B positive people, are clients or workers in certain care facilities, or travel to or work in countries with a high rate of hepatitis B.     Preventive Service / Frequency  . Ages 71 to 82  Blood pressure check.  Lipid and cholesterol check  Lung cancer screening. / Every year if you are aged 55-80 years and have a 30-pack-year history of smoking and currently smoke or have quit within the past 15 years. Yearly screening is stopped once you have quit smoking for at least 15 years or develop a health problem that would prevent you from having lung cancer treatment.  Fecal occult blood test (FOBT) of stool. / Every  year beginning at age 10 and continuing until age 51. You may not have to do this test if you get a colonoscopy every 10 years.  Flexible sigmoidoscopy** or colonoscopy.** / Every 5 years for a flexible sigmoidoscopy or every 10 years for a colonoscopy beginning at age 42 and continuing until age 74. Screening for abdominal aortic aneurysm (AAA) by ultrasound is recommended for people who have history of high blood pressure or who are current or former smokers.  ++++++++++++++++++++++++++++++++++++++++++++++++++++++++++++++  Recommend Adult Low Dose Aspirin or coated Aspirin 81 mg daily To reduce risk of Colon Cancer 20 % Skin Cancer 26 %  Melanoma 46% and Pancreatic cancer 60%  +++++++++++++++++++++++++++++++++++++++++++++++++++++++++++++  Vitamin D goal is between 50-100. Please make sure that you are taking your Vitamin D as directed.  It is very important as a natural anti-inflammatory - helping with muscle and joint aches; as well as helping hair, skin, and nails; as well as reducing stroke, heart attack and cancer risk. It helps your bones and helps with mood. It also decreases numerous cancer risks so please take it as directed.  - Low Vit D is associated with a 200-300% higher risk for CANCER and 200-300% higher risk for HEART ATTACK & STROKE.  It is also associated with higher death rate at younger ages, autoimmune diseases like Rheumatoid arthritis, Lupus, Multiple Sclerosis; also many other serious conditions, like depression, Alzheimer's Dementia, infertility, muscle aches, fatigue, fibromyalgia - just to name a few.  +++++++++++++++++++++++++++++++++++++++++++++++++++++++++++  Recommend the book "The END of DIETING" by Dr Monico Hoar & the book "The END of DIABETES " by Dr Monico Hoar At [email or web address] - get book & Audio CD's   --->Being diabetic has a 300% increased risk for heart attack, stroke, cancer, and alzheimer- type vascular dementia. It is very important that you  work harder with diet by avoiding all foods that are white. Avoid white rice (brown & wild rice is OK), white potatoes (sweet potatoes in moderation is OK), White bread or wheat bread or anything made out of white flour like bagels, donuts, rolls, buns, biscuits, cakes, pastries, cookies, pizza crust, and pasta (made from white flour & egg whites) - vegetarian pasta or spinach or wheat pasta is OK. Multigrain  breads like Arnold's or [employer], or multigrain sandwich thins or flatbreads. Diet, exercise and weight loss can reverse and cure diabetes in the early stages. Diet, exercise and weight loss is very important in the control and prevention of complications of diabetes which affects every system in your body, ie. Brain - dementia/stroke, eyes - glaucoma/blindness, heart - heart attack/heart failure, kidneys - dialysis, stomach - gastric paralysis, intestines - malabsorption, nerves - severe painful neuritis, circulation - gangrene & loss of a leg(s), and finally cancer and Alzheimers.  I recommend avoid fried & greasy foods, sweets/candy, white rice (brown or wild rice or Quinoa is OK), white potatoes (sweet potatoes are OK) - anything made from white flour - bagels, doughnuts, rolls, buns, biscuits,white and wheat breads, pizza crust and traditional pasta made of white flour & egg white(vegetarian pasta or spinach or wheat pasta is OK). Multi-grain bread is OK - like multi-grain flat bread or sandwich thins. Avoid alcohol in excess.  Exercise is also important. Eat all the vegetables you want - avoid fatty meats, especially red meat and dairy - especially cheese. Cheese is the most concentrated form of trans-fats which is the worst thing to clog up our arteries. Veggie cheese is OK which can be found in the fresh produce section at [employer] or [employer] or [employer].  ++++++++++++++++++++++ DASH Eating Plan  DASH stands for "Dietary Approaches to Stop Hypertension."  The DASH eating plan  is a healthy eating plan that has been shown to reduce high blood pressure (hypertension). Additional health benefits may include reducing the risk of type 2 diabetes mellitus, heart disease, and stroke. The DASH eating plan may also help with weight loss.  WHAT DO I NEED TO KNOW ABOUT THE DASH EATING PLAN? For the DASH eating plan, you will follow these general guidelines: . Choose foods with a percent daily value for sodium of less than 5% (as listed on the food label). . Use salt-free seasonings or herbs instead of table salt or sea salt. . Check with your health care provider or pharmacist before using salt substitutes. . Eat lower-sodium products, often labeled as "lower sodium" or "no salt added." . Eat fresh foods. . Eat more vegetables, fruits, and low-fat dairy products. . Choose whole grains. Look for the word "whole" as the first word in the ingredient list. . Choose fish . Limit sweets, desserts, sugars, and sugary drinks. . Choose heart-healthy fats. . Eat veggie cheese . Eat more home-cooked food and less restaurant, buffet, and fast food. . Limit fried foods. Adriana Simas foods using methods other than frying. . Limit canned vegetables. If you do use them, rinse them well to decrease the sodium. . When eating at a restaurant, ask that your food be prepared with less salt, or no salt if possible.   WHAT FOODS CAN I EAT? Read Dr Francis Dowse Fuhrman's books on The End of Dieting & The End of Diabetes  Grains Whole grain or whole wheat bread. Brown rice. Whole grain or whole wheat pasta. Quinoa, bulgur, and whole grain cereals. Low-sodium cereals. Corn or whole wheat flour tortillas. Whole grain cornbread. Whole grain crackers. Low-sodium crackers.  Vegetables Fresh or frozen vegetables (raw, steamed, roasted, or grilled). Low-sodium or reduced-sodium tomato and vegetable juices. Low-sodium or reduced-sodium tomato sauce and paste. Low-sodium or reduced-sodium canned  vegetables.  Fruits All fresh, canned (in natural juice), or frozen fruits.  Protein Products All fish and seafood. Dried beans, peas, or lentils. Unsalted nuts and seeds. Unsalted canned  beans.  Dairy Low-fat dairy products, such as skim or 1% milk, 2% or reduced-fat cheeses, low-fat ricotta or cottage cheese, or plain low-fat yogurt. Low-sodium or reduced-sodium cheeses.  Fats and Oils Tub margarines without trans fats. Light or reduced-fat mayonnaise and salad dressings (reduced sodium). Avocado. Safflower, olive, or canola oils. Natural peanut or almond butter.  Other Unsalted popcorn and pretzels. The items listed above may not be a complete list of recommended foods or beverages. Contact your dietitian for more options.  ++++++++++++++++++++++++++++++++++++++++++++++++++++++++++++++++  WHAT FOODS ARE NOT RECOMMENDED?  Grains/ White flour or wheat flour White bread. White pasta. White rice. Refined cornbread. Bagels and croissants. Crackers that contain trans fat. Vegetables Creamed or fried vegetables. Vegetables in a . Regular canned vegetables. Regular canned tomato sauce and paste. Regular tomato and vegetable juices. Fruits Dried fruits. Canned fruit in light or heavy syrup. Fruit juice. Meat and Other Protein Products Meat in general - RED meat & White meat. Fatty cuts of meat. Ribs, chicken wings, all processed meats as bacon, sausage, bologna, salami, fatback, hot dogs, bratwurst and packaged luncheon meats. Dairy Whole or 2% milk, cream, half-and-half, and cream cheese. Whole-fat or sweetened yogurt. Full-fat cheeses or blue cheese. Non-dairy creamers and whipped toppings. Processed cheese, cheese spreads, or cheese curds.  Condiments Onion and garlic salt, seasoned salt, table salt, and sea salt. Canned and packaged gravies. Worcestershire sauce. Tartar sauce. Barbecue sauce. Teriyaki sauce. Soy sauce, including reduced sodium. Steak sauce. Fish sauce. Oyster sauce.  Cocktail sauce. Horseradish. Ketchup and mustard. Meat flavorings and tenderizers. Bouillon cubes. Hot sauce. Tabasco sauce. Marinades. Taco seasonings. Relishes. Fats and Oils Butter, stick margarine, lard, shortening and bacon fat. Coconut, palm kernel, or palm oils. Regular salad dressings. Pickles and olives. Salted popcorn and pretzels. The items listed above may not be a complete list of foods and beverages to avoid.

## 2017-01-19 LAB — COMPREHENSIVE METABOLIC PANEL
A/G RATIO: 1.5 (ref 1.2–2.2)
ALK PHOS: 106 IU/L (ref 39–117)
ALT: 56 IU/L — ABNORMAL HIGH (ref 0–32)
AST: 24 IU/L (ref 0–40)
Albumin: 4 g/dL (ref 3.6–4.8)
BUN/Creatinine Ratio: 20 (ref 12–28)
BUN: 14 mg/dL (ref 8–27)
Bilirubin Total: 0.4 mg/dL (ref 0.0–1.2)
CHLORIDE: 95 mmol/L — AB (ref 96–106)
CO2: 29 mmol/L (ref 20–29)
CREATININE: 0.71 mg/dL (ref 0.57–1.00)
Calcium: 8.9 mg/dL (ref 8.7–10.3)
GFR calc Af Amer: 105 mL/min/{1.73_m2} (ref >59)
GFR calc non Af Amer: 91 mL/min/{1.73_m2} (ref >59)
GLOBULIN, TOTAL: 2.6 g/dL (ref 1.5–4.5)
Glucose: 102 mg/dL — ABNORMAL HIGH (ref 65–99)
POTASSIUM: 3.2 mmol/L — AB (ref 3.5–5.2)
SODIUM: 141 mmol/L (ref 134–144)
Total Protein: 6.6 g/dL (ref 6.0–8.5)

## 2017-01-19 LAB — HEMOGLOBIN A1C
ESTIMATED AVERAGE GLUCOSE: 114 mg/dL
HEMOGLOBIN A1C: 5.6 % (ref 4.8–5.6)

## 2017-01-19 LAB — CBC WITH DIFFERENTIAL
BASOS ABS: 0 10*3/uL (ref 0.0–0.2)
Basos: 1 %
EOS (ABSOLUTE): 0.4 10*3/uL (ref 0.0–0.4)
Eos: 5 %
HEMATOCRIT: 38.8 % (ref 34.0–46.6)
Hemoglobin: 13.1 g/dL (ref 11.1–15.9)
IMMATURE GRANULOCYTES: 0 %
Immature Grans (Abs): 0 10*3/uL (ref 0.0–0.1)
LYMPHS ABS: 1.7 10*3/uL (ref 0.7–3.1)
Lymphs: 25 %
MCH: 30 pg (ref 26.6–33.0)
MCHC: 33.8 g/dL (ref 31.5–35.7)
MCV: 89 fL (ref 79–97)
MONOS ABS: 0.5 10*3/uL (ref 0.1–0.9)
Monocytes: 7 %
Neutrophils Absolute: 4.4 10*3/uL (ref 1.4–7.0)
Neutrophils: 62 %
RBC: 4.37 x10E6/uL (ref 3.77–5.28)
RDW: 14 % (ref 12.3–15.4)
WBC: 7.1 10*3/uL (ref 3.4–10.8)

## 2017-01-19 LAB — IRON AND TIBC
IRON: 86 ug/dL (ref 27–139)
Iron Saturation: 29 % (ref 15–55)
Total Iron Binding Capacity: 295 ug/dL (ref 250–450)
UIBC: 209 ug/dL (ref 118–369)

## 2017-01-19 LAB — VITAMIN B12: VITAMIN B 12: 306 pg/mL (ref 232–1245)

## 2017-01-19 LAB — LIPID PANEL
Chol/HDL Ratio: 5.4 ratio — ABNORMAL HIGH (ref 0.0–4.4)
Cholesterol, Total: 233 mg/dL — ABNORMAL HIGH (ref 100–199)
HDL: 43 mg/dL (ref >39)
LDL Calculated: 148 mg/dL — ABNORMAL HIGH (ref 0–99)
Triglycerides: 209 mg/dL — ABNORMAL HIGH (ref 0–149)
VLDL Cholesterol Cal: 42 mg/dL — ABNORMAL HIGH (ref 5–40)

## 2017-01-19 LAB — VITAMIN D 25 HYDROXY (VIT D DEFICIENCY, FRACTURES): VIT D 25 HYDROXY: 35 ng/mL (ref 30.0–100.0)

## 2017-01-19 LAB — TSH: TSH: 1.05 u[IU]/mL (ref 0.450–4.500)

## 2017-01-19 LAB — T4, FREE: FREE T4: 1.27 ng/dL (ref 0.82–1.77)

## 2017-01-19 LAB — FERRITIN: FERRITIN: 72 ng/mL (ref 15–150)

## 2017-01-26 ENCOUNTER — Encounter: Payer: Self-pay | Admitting: Family Medicine

## 2017-01-26 ENCOUNTER — Other Ambulatory Visit (INDEPENDENT_AMBULATORY_CARE_PROVIDER_SITE_OTHER): Payer: BLUE CROSS/BLUE SHIELD

## 2017-01-26 DIAGNOSIS — Z1211 Encounter for screening for malignant neoplasm of colon: Secondary | ICD-10-CM | POA: Diagnosis not present

## 2017-01-26 LAB — POC HEMOCCULT BLD/STL (HOME/3-CARD/SCREEN)
Card #2 Fecal Occult Blod, POC: NEGATIVE
Card #3 Fecal Occult Blood, POC: NEGATIVE
Fecal Occult Blood, POC: NEGATIVE

## 2017-01-26 NOTE — Progress Notes (Signed)
Patient brought in at home stool cards.

## 2017-02-16 ENCOUNTER — Other Ambulatory Visit: Payer: Self-pay

## 2017-02-28 ENCOUNTER — Other Ambulatory Visit: Payer: Self-pay | Admitting: Family Medicine

## 2017-02-28 DIAGNOSIS — I10 Essential (primary) hypertension: Secondary | ICD-10-CM

## 2017-02-28 DIAGNOSIS — K219 Gastro-esophageal reflux disease without esophagitis: Secondary | ICD-10-CM

## 2017-02-28 NOTE — Telephone Encounter (Signed)
We have not prescribed omeprazole for the patient previously.  Please review and refill if appropriate.  Tiajuana Amass, CMA

## 2017-03-12 ENCOUNTER — Other Ambulatory Visit: Payer: Self-pay | Admitting: Family Medicine

## 2017-03-12 DIAGNOSIS — I10 Essential (primary) hypertension: Secondary | ICD-10-CM

## 2017-04-07 DIAGNOSIS — M65331 Trigger finger, right middle finger: Secondary | ICD-10-CM | POA: Insufficient documentation

## 2017-04-07 DIAGNOSIS — G5603 Carpal tunnel syndrome, bilateral upper limbs: Secondary | ICD-10-CM | POA: Insufficient documentation

## 2017-04-07 DIAGNOSIS — M79642 Pain in left hand: Secondary | ICD-10-CM

## 2017-04-07 DIAGNOSIS — M79641 Pain in right hand: Secondary | ICD-10-CM | POA: Insufficient documentation

## 2017-04-19 LAB — BASIC METABOLIC PANEL
BUN: 17 (ref 4–21)
BUN: 17 (ref 4–21)
CREATININE: 0.8 (ref 0.5–1.1)
Creatinine: 0.8 (ref 0.5–1.1)
GLUCOSE: 104
Glucose: 104
POTASSIUM: 3.6 (ref 3.4–5.3)
POTASSIUM: 3.6 (ref 3.4–5.3)
Sodium: 140 (ref 137–147)
Sodium: 140 (ref 137–147)

## 2017-04-19 LAB — CBC AND DIFFERENTIAL
HCT: 42 (ref 36–46)
HCT: 42 (ref 36–46)
HEMOGLOBIN: 14 (ref 12.0–16.0)
Hemoglobin: 14 (ref 12.0–16.0)
Platelets: 295 (ref 150–399)
Platelets: 295 (ref 150–399)
WBC: 7
WBC: 7

## 2017-04-19 LAB — HEPATIC FUNCTION PANEL
ALT: 151 — AB (ref 7–35)
ALT: 151 — AB (ref 7–35)
AST: 81 — AB (ref 13–35)
AST: 81 — AB (ref 13–35)
Alkaline Phosphatase: 129 — AB (ref 25–125)
Alkaline Phosphatase: 129 — AB (ref 25–125)
BILIRUBIN, TOTAL: 0.3
Bilirubin, Total: 0.3

## 2017-04-19 LAB — POCT ERYTHROCYTE SEDIMENTATION RATE, NON-AUTOMATED: SED RATE: 5

## 2017-05-12 ENCOUNTER — Other Ambulatory Visit: Payer: Self-pay | Admitting: Adult Health

## 2017-05-12 DIAGNOSIS — J302 Other seasonal allergic rhinitis: Secondary | ICD-10-CM

## 2017-05-16 ENCOUNTER — Other Ambulatory Visit: Payer: Self-pay | Admitting: Family Medicine

## 2017-05-16 DIAGNOSIS — G4700 Insomnia, unspecified: Secondary | ICD-10-CM

## 2017-05-17 ENCOUNTER — Telehealth: Payer: Self-pay | Admitting: Family Medicine

## 2017-05-17 NOTE — Telephone Encounter (Signed)
Cld patient to get set up for provider required OV-- left message request pPt call office.  -glh

## 2017-05-17 NOTE — Telephone Encounter (Signed)
Pt called back stating she was here in September for an OV and does not have the funds to keep coming every 3 month for a f/u--- patient states had labs drawn at other provider office & will have them fax results to Dr. Sharee Holster-- pt did not make appt.-- --glh

## 2017-05-17 NOTE — Telephone Encounter (Signed)
Noted MPulliam, CMA/RT(R)  

## 2017-05-23 ENCOUNTER — Other Ambulatory Visit: Payer: Self-pay

## 2017-05-23 DIAGNOSIS — I10 Essential (primary) hypertension: Secondary | ICD-10-CM

## 2017-05-23 MED ORDER — LOSARTAN POTASSIUM 50 MG PO TABS: 50.0000 mg | ORAL_TABLET | Freq: Every day | ORAL | 0 refills | Status: DC

## 2017-05-23 NOTE — Telephone Encounter (Signed)
Pharmacy sent refill request for Losartan. Reviewed chart and sent refill into the pharmacy. MPulliam, CMA/RT(R)

## 2017-06-13 ENCOUNTER — Other Ambulatory Visit: Payer: Self-pay | Admitting: Family Medicine

## 2017-06-13 DIAGNOSIS — K219 Gastro-esophageal reflux disease without esophagitis: Secondary | ICD-10-CM

## 2017-06-22 ENCOUNTER — Ambulatory Visit: Payer: Self-pay

## 2017-06-22 ENCOUNTER — Ambulatory Visit (INDEPENDENT_AMBULATORY_CARE_PROVIDER_SITE_OTHER): Payer: BLUE CROSS/BLUE SHIELD | Admitting: Family Medicine

## 2017-06-22 ENCOUNTER — Encounter: Payer: Self-pay | Admitting: Family Medicine

## 2017-06-22 VITALS — BP 119/82 | HR 83 | Temp 98.7°F | Ht 62.0 in | Wt 213.9 lb

## 2017-06-22 DIAGNOSIS — R7989 Other specified abnormal findings of blood chemistry: Secondary | ICD-10-CM

## 2017-06-22 DIAGNOSIS — K219 Gastro-esophageal reflux disease without esophagitis: Secondary | ICD-10-CM | POA: Diagnosis not present

## 2017-06-22 DIAGNOSIS — K529 Noninfective gastroenteritis and colitis, unspecified: Secondary | ICD-10-CM

## 2017-06-22 DIAGNOSIS — M069 Rheumatoid arthritis, unspecified: Secondary | ICD-10-CM

## 2017-06-22 DIAGNOSIS — I10 Essential (primary) hypertension: Secondary | ICD-10-CM

## 2017-06-22 DIAGNOSIS — R945 Abnormal results of liver function studies: Secondary | ICD-10-CM | POA: Diagnosis not present

## 2017-06-22 DIAGNOSIS — J329 Chronic sinusitis, unspecified: Secondary | ICD-10-CM

## 2017-06-22 DIAGNOSIS — J029 Acute pharyngitis, unspecified: Secondary | ICD-10-CM | POA: Diagnosis not present

## 2017-06-22 DIAGNOSIS — G894 Chronic pain syndrome: Secondary | ICD-10-CM | POA: Diagnosis not present

## 2017-06-22 DIAGNOSIS — Z79899 Other long term (current) drug therapy: Secondary | ICD-10-CM

## 2017-06-22 DIAGNOSIS — M797 Fibromyalgia: Secondary | ICD-10-CM | POA: Diagnosis not present

## 2017-06-22 MED ORDER — OMEPRAZOLE 20 MG PO CPDR: DELAYED_RELEASE_CAPSULE | ORAL | 1 refills | Status: DC

## 2017-06-22 NOTE — Progress Notes (Signed)
Assessment and plan:  1. Rhinosinusitis   2. Pharyngitis, unspecified etiology   3. Gastroesophageal reflux disease without esophagitis   4. Acute on Chronic diarrhea   5. Essential hypertension   6. Chronic pain syndrome   7. Fibromyalgia   8. Encounter for long-term (current) use of high-risk medication   9. Rheumatoid arthritis, involving unspecified site, unspecified rheumatoid factor presence (HCC)   10. acute on chronic Elevated liver function tests    1. rhinosinusitis- Use OTC medications and advil and tylenol cold and sinus (especially those for pts with high blood pressure) for symptom relief.  - Viral vs Allergic vs Bacterial causes for pt's symptoms reveiwed.    - Supportive care and various OTC medications discussed in addition to any prescribed. - Call or RTC if new symptoms, or if no improvement or worse over next several days.   - Will consider ABX if sx continue past 10 days and worsening if not already given.   2. Pharyngitis-Use neti pot and sinus rinses, followed by one spray of flonase in each nostril.  3. GERD- refilled meds today. Sx stable, refill today.  4. Acute on chronic diarrhea- -ambulatory referral given to GI. -call and ask rheumatology, Dr. Kathi Ludwig what meds she is on as the only new thing that pt has done that could be causing her acute elevated in hepatic enzymes. Pt vehemently proclaims no other new travel, medications, diets, or exposure, etc. -recheck liver enzymes, order complete abdominal US. -hepatitis panel   5. Essential HTN- BP stable, continue meds.   6. Chronic pain syndrome/Fibromyalgia- Dr. Kathi Ludwig manages her care. This is likely the reason for her myalgias now as pt states they are not beyond baseline.   7. Encounter for long-term high risk medication- explain to pt her recent changes in rheumatology meds can be causing and worsening her liver function.  8. RA- Dr. Kathi Ludwig manages her care. Told pt to update [place] on new medications  that were given to her recently.   9. Acute on Chronic elevated liver function tests- likely due to new med on rheumatology. Pt does not know the name of this medication. She will call them and inquire about this. Will send for complete abdominal US.  Will obtain labs today. Will send to GI.     Meds ordered this encounter  Medications  . omeprazole (PRILOSEC) 20 MG capsule    Sig: TAKE 1 CAPSULE BY MOUTH EVERY DAY    Dispense:  90 capsule    Refill:  1    Orders Placed This Encounter  Procedures  . US Abdomen Complete  . Comprehensive metabolic panel  . CBC with Differential/Platelet  . Bilirubin, neonatal (fractionated - tot/dir/indir)  . Gamma GT  . Magnesium  . Phosphorus  . Hepatitis panel, acute  . Ambulatory referral to Gastroenterology    Gross side effects, risk and benefits, and alternatives of medications discussed with patient.  Patient is aware that all medications have potential side effects and we are unable to predict every sideeffect or drug-drug interaction that may occur.  Expresses verbal understanding and consents to current therapy plan and treatment regiment.   Education and routine counseling performed. Handouts provided.  Anticipatory guidance and routine counseling done re: condition, txmnt options and need for follow up. All questions of patient's were answered.  Return for f/up in 6 wks for reassessment. and to discuss other chronic conditions NOT d/c pt today.  Please see AVS handed out to patient at the  end of our visit for additional patient instructions/ counseling done pertaining to today's office visit.  Note: This document was partially repared using Dragon voice recognition software and may include unintentional dictation errors.  Pt was in the office today for 60+ minutes, with over 50% time spent in face to face counseling of patients various medical conditions, treatment plans of those medical conditions including medicine management and  lifestyle modification, strategies to improve health and well being; and in coordination of care. SEE ABOVE FOR DETAILS   This document serves as a record of services personally performed by Thomasene Lot, DO. It was created on her behalf by Thelma Barge, a trained medical scribe. The creation of this record is based on the scribe's personal observations and the provider's statements to them.   I have reviewed the above medical documentation for accuracy and completeness and I concur.  Thomasene Lot 07/09/17 5:58 PM    Subjective:    Chief Complaint  Patient presents with  . Follow-up   HPI:  Pt presents with Sx for 2-3 days, but worsened today.   C/o: congestion, subjective fever, chills, hoarse voice, ear pain, sore throat, post-nasal drip,   Denies: generalized body aches beyond baseline. She further denies any positive sick contacts for flu or strep.     For symptoms patient has tried:  Nothing for her symptoms  Overall getting:   worse  She states it feels like the flu.  Elevated liver enzymes Her AST was 43, ALT 63 back 08-14-2012. On 11-23-12, she had US abdomen done for elevated liver enzymes. Results were WNL.  Pt has a h/o severe RA and OA, her rheumatologist started her on new meds in Aug/Sept 2018. She got labs done in 04-19-17 for hepatic function panels with all elevated results. She states she started new meds in Aug/Sept 2018. She does not drink.     GI: She complains of constant diarrhea for 2 months. She has 4-5 daily and always loose stools. She has associated occasional abdominal cramping before she has a BM. She also reports mild hematochezia (1-2 times). She thought it might go away on its own, so she did not schedule an appointment. She usually goes to the bathroom every other day or every 2 days, but now she has 4-5 episodes of diarrhea daily. She has not been on abx. She has not changed her diet or dietary supplements. She is not stressed out of the  normal. She has not traveled outside of the country. She has not tried anything for symptom relief. She has tried more greens, fruits. She has decreased sugars and still has her symptoms. She has not taken any new medications. She denies nausea and vomiting. Her last colonoscopy was 09-27-12 with Dr. , WNL results, and she is due in 2024. She was sent home with hemoccult cards in 01-26-17 with results WNL.  Patient Care Team    Relationship Specialty Notifications Start End  Thomasene Lot, DO PCP - General Family Medicine  03/11/16   Hilda Lias, MD Consulting Physician Neurosurgery  03/11/16   Rossie Muskrat, MD Consulting Physician Rheumatology  03/11/16   Claria Dice, MD Attending Physician Physical Medicine and Rehabilitation  10/04/16    Comment: INjections in back/ neck-- part of Tacey Heap, [facility] Consulting Physician Specialist  01/06/17    Comment: treating her for chronic neck pain- last seen this june/ july.   [facility] Consulting Physician Dermatology  01/18/17     Past medical  history, Surgical history, Family history reviewed and noted below, Social history, Allergies, and Medications have been entered into the medical record, reviewed and changed as needed.   Allergies  Allergen Reactions  . Hydrocodone Itching    Review of Systems: - see above HPI for pertinent positives General:   No F/C, wt loss Pulm:   No DIB, pleuritic chest pain Card:  No CP, palpitations Abd:  No n/v or pain Ext:  No inc edema from baseline   Objective:   Blood pressure 119/82, pulse 83, temperature 98.7 F (37.1 C), height 5\' 2"  (1.575 m), weight 213 lb 14.4 oz (97 kg), SpO2 96 %. Body mass index is 39.12 kg/m. General: Well Developed, well nourished, appropriate for stated age.  Neuro: Alert and oriented x3, extra-ocular muscles intact, sensation grossly intact.  HEENT: Normocephalic, atraumatic, pupils equal round reactive to light, neck supple, no masses,  no painful lymphadenopathy, TM's intact B/L, no acute findings. nares- edematous, erythematous, clear discharge, OP- mild erythema, otherwise normal. Ttenderness to bilateral maxillary sinuses, tenderness to anterior cervical lymph nodes bilaterally,  Skin: Warm and dry, no gross rash. Cardiac: RRR, S1 S2,  no murmurs rubs or gallops.  Respiratory: ECTA B/L and A/P, Not using accessory muscles, speaking in full sentences- unlabored. Vascular:  No gross lower ext edema, cap RF less 2 sec. Psych: No HI/SI, judgement and insight good, Euthymic mood. Full Affect. Abdomen: no G/R/R.

## 2017-06-22 NOTE — Patient Instructions (Signed)
You most likely have a viral infection that should resolve on its own over time (or this could be a flare of seasonal allergies as well).   Symptoms for a viral upper respiratory tract infection usually last 3-7 days but can stretch out to 2-3 weeks before you're feeling back to normal.  Your symptoms should not worsen after 7-10 days and if they truely do, please notify our office, as you may need antibiotics.  You can use over-the-counter afrin nasal spray for up to 3 days (NO longer than that) which will help acutely with nasal drainage/ congestion short term.   Also, sterile saline nasal rinses, such as Neil med or AYR sinus rinses, can be very helpful and should be done twice daily- especially throughout the allergy season.   Remember you should use distilled water or previously boiled water to do this.   You can also use an over the counter cold and flu medication such as Tylenol Severe Cold and Sinus/Flu or Dayquil, Nyquil and the like, which will help with cough, congestion, headache/ pain, fevers/chills etc.  Please note, if you being treated for hypertension or have high blood pressure, you should be using the cold meds designated "HBP".    Unfortunately, antibiotics are not helpful for viral infections.  Wash your hands frequently, as you did not want to get those around you sick as well. Never sneeze or cough on others.  And you should not be going to school or work if you are running a temperature of 100.5 or more on two separate occasions.   Drink plenty of fluids and stay hydrated, especially if you are running fevers.  We don't know why, but chicken soup also helps, try it! :) 

## 2017-06-23 LAB — COMPREHENSIVE METABOLIC PANEL
ALBUMIN: 4.2 g/dL (ref 3.6–4.8)
ALT: 53 IU/L — ABNORMAL HIGH (ref 0–32)
AST: 28 IU/L (ref 0–40)
Albumin/Globulin Ratio: 1.6 (ref 1.2–2.2)
Alkaline Phosphatase: 126 IU/L — ABNORMAL HIGH (ref 39–117)
BUN / CREAT RATIO: 15 (ref 12–28)
BUN: 12 mg/dL (ref 8–27)
Bilirubin Total: 0.3 mg/dL (ref 0.0–1.2)
CO2: 26 mmol/L (ref 20–29)
CREATININE: 0.78 mg/dL (ref 0.57–1.00)
Calcium: 9.1 mg/dL (ref 8.7–10.3)
Chloride: 98 mmol/L (ref 96–106)
GFR calc non Af Amer: 81 mL/min/{1.73_m2} (ref >59)
GFR, EST AFRICAN AMERICAN: 94 mL/min/{1.73_m2} (ref >59)
GLUCOSE: 91 mg/dL (ref 65–99)
Globulin, Total: 2.7 g/dL (ref 1.5–4.5)
Potassium: 3.1 mmol/L — ABNORMAL LOW (ref 3.5–5.2)
Sodium: 142 mmol/L (ref 134–144)
TOTAL PROTEIN: 6.9 g/dL (ref 6.0–8.5)

## 2017-06-23 LAB — GAMMA GT: GGT: 151 IU/L — ABNORMAL HIGH (ref 0–60)

## 2017-06-23 LAB — HEPATITIS PANEL, ACUTE
HEP A IGM: NEGATIVE
HEP C VIRUS AB: 0.1 {s_co_ratio} (ref 0.0–0.9)
Hep B C IgM: NEGATIVE
Hepatitis B Surface Ag: NEGATIVE

## 2017-06-23 LAB — BILIRUBIN, FRACTIONATED(TOT/DIR/INDIR)
BILIRUBIN INDIRECT: 0.16 mg/dL (ref 0.10–0.80)
Bilirubin, Direct: 0.14 mg/dL (ref 0.00–0.40)

## 2017-06-23 LAB — CBC WITH DIFFERENTIAL/PLATELET
Basophils Absolute: 0.1 10*3/uL (ref 0.0–0.2)
Basos: 1 %
EOS (ABSOLUTE): 0.4 10*3/uL (ref 0.0–0.4)
EOS: 6 %
HEMOGLOBIN: 13.5 g/dL (ref 11.1–15.9)
Hematocrit: 40.1 % (ref 34.0–46.6)
Immature Grans (Abs): 0 10*3/uL (ref 0.0–0.1)
Immature Granulocytes: 0 %
LYMPHS ABS: 1.6 10*3/uL (ref 0.7–3.1)
Lymphs: 24 %
MCH: 29.7 pg (ref 26.6–33.0)
MCHC: 33.7 g/dL (ref 31.5–35.7)
MCV: 88 fL (ref 79–97)
MONOCYTES: 10 %
Monocytes Absolute: 0.7 10*3/uL (ref 0.1–0.9)
NEUTROS ABS: 4.1 10*3/uL (ref 1.4–7.0)
Neutrophils: 59 %
Platelets: 309 10*3/uL (ref 150–379)
RBC: 4.54 x10E6/uL (ref 3.77–5.28)
RDW: 14.2 % (ref 12.3–15.4)
WBC: 6.8 10*3/uL (ref 3.4–10.8)

## 2017-06-23 LAB — PHOSPHORUS: Phosphorus: 3.2 mg/dL (ref 2.5–4.5)

## 2017-06-23 LAB — MAGNESIUM: MAGNESIUM: 2 mg/dL (ref 1.6–2.3)

## 2017-06-27 ENCOUNTER — Telehealth: Payer: Self-pay | Admitting: Family Medicine

## 2017-06-27 NOTE — Telephone Encounter (Signed)
Patient would like to change her pharmacy from [facility] to [facility] [place] for any future prescriptions/refills

## 2017-06-27 NOTE — Telephone Encounter (Signed)
Noted and Pharmacy has been changed in patient's chart. MPulliam, CMA/RT(R)

## 2017-07-01 ENCOUNTER — Ambulatory Visit
Admission: RE | Admit: 2017-07-01 | Discharge: 2017-07-01 | Disposition: A | Discharge status: Home / Self Care | Payer: BLUE CROSS/BLUE SHIELD | Source: Ambulatory Visit | Attending: Family Medicine | Admitting: Family Medicine

## 2017-07-01 DIAGNOSIS — R7989 Other specified abnormal findings of blood chemistry: Secondary | ICD-10-CM

## 2017-07-01 DIAGNOSIS — R945 Abnormal results of liver function studies: Secondary | ICD-10-CM

## 2017-07-01 DIAGNOSIS — K529 Noninfective gastroenteritis and colitis, unspecified: Secondary | ICD-10-CM

## 2017-07-05 ENCOUNTER — Other Ambulatory Visit (INDEPENDENT_AMBULATORY_CARE_PROVIDER_SITE_OTHER): Payer: BLUE CROSS/BLUE SHIELD

## 2017-07-05 ENCOUNTER — Other Ambulatory Visit: Payer: Self-pay

## 2017-07-05 ENCOUNTER — Encounter: Payer: Self-pay | Admitting: Gastroenterology

## 2017-07-05 ENCOUNTER — Ambulatory Visit: Payer: BLUE CROSS/BLUE SHIELD | Admitting: Gastroenterology

## 2017-07-05 DIAGNOSIS — K529 Noninfective gastroenteritis and colitis, unspecified: Secondary | ICD-10-CM

## 2017-07-05 DIAGNOSIS — R945 Abnormal results of liver function studies: Secondary | ICD-10-CM

## 2017-07-05 DIAGNOSIS — R1012 Left upper quadrant pain: Secondary | ICD-10-CM

## 2017-07-05 DIAGNOSIS — R7989 Other specified abnormal findings of blood chemistry: Secondary | ICD-10-CM

## 2017-07-05 LAB — IGA: IGA: 200 mg/dL (ref 68–378)

## 2017-07-05 MED ORDER — PEG-KCL-NACL-NASULF-NA ASC-C 140 G PO SOLR: 140.0000 g | ORAL | 0 refills | Status: DC

## 2017-07-05 NOTE — Progress Notes (Signed)
[facility] Consult Note:  History: [name] 07/05/2017  Referring physician: Thomasene Lot, DO  Reason for consult/chief complaint: Diarrhea (Pt complains of diarrhea x3 months, Normally pt would have BMs every 2 days. Radiating pain on left side to baack.) and Elevated LFTs (Shown on labs from PCP.)   Subjective  HPI:  This is a 62 year old woman referred by primary care noted above for recent onset diarrhea.  It began in early December with no clear triggers.  It seemed to come on over several days and has been unrelenting since then.  She recalls no previous antibiotics, travel or sick contacts.  She had no new medicines prior to the onset of symptoms.  There is also a crampy left upper quadrant pain that frequently accompanies the diarrhea.  She will have between 4 and 6 semi-formed to loose BMs per day with no bleeding.  There have been rare episodes of nocturnal diarrhea.  She had a few episodes of incontinence during the daytime when the symptoms first started.  Donnie last had a normal screening colonoscopy with Dr. Juanda Chance in May 2014.  As a separate issue, she had an elevation of LFTs over her baseline in December 2018, which was also of concern to her primary care provider and an additional reason for consult. ROS:  Review of Systems  Constitutional: Negative for appetite change and unexpected weight change.  HENT: Negative for mouth sores and voice change.   Eyes: Negative for pain and redness.  Respiratory: Negative for cough and shortness of breath.   Cardiovascular: Negative for chest pain and palpitations.  Genitourinary: Negative for dysuria and hematuria.  Musculoskeletal: Positive for back pain. Negative for arthralgias and myalgias.  Skin: Negative for pallor and rash.  Neurological: Negative for weakness and headaches.  Hematological: Negative for adenopathy.  Psychiatric/Behavioral: Positive for dysphoric mood.     Past Medical  History: Past Medical History:  Diagnosis Date  . Allergy   . Anxiety    takes Citalopram daily  . Arthritis   . Chronic back pain    radiculopathy  . Fibromyalgia   . GERD (gastroesophageal reflux disease)    takes Omeprazole daily  . History of bronchitis    last yr  . History of shingles   . Hyperlipidemia    takes Atorvastatin daily  . Hypertension    takes HCTZ daily as well as Propranolol  . Insomnia    takes trazodone nightly  . Joint pain   . Joint swelling   . Migraine    hx of  . Muscle spasm of back    takes Flexeril daily as needed  . Obesity   . Pneumonia    hx of-many,many yrs ago  . PONV (postoperative nausea and vomiting)    violent nausea/vomiting  . Rheumatoid arthritis (HCC)   . Seasonal allergies    takes Claritin daily  . Urinary urgency      Past Surgical History: Past Surgical History:  Procedure Laterality Date  . ABDOMINAL HYSTERECTOMY  2003  . ABDOMINOPLASTY  07/2015  . BACK SURGERY    . CARPAL TUNNEL RELEASE Bilateral   . CHOLECYSTECTOMY N/A 06/27/2012   Procedure: LAPAROSCOPIC CHOLECYSTECTOMY WITH INTRAOPERATIVE CHOLANGIOGRAM;  Surgeon: Currie Paris, MD;  Location: [facility];  Service: General;  Laterality: N/A;  . COLONOSCOPY    . LUMBAR LAMINECTOMY/DECOMPRESSION MICRODISCECTOMY Right 04/23/2014   Procedure: Right L5-S1 Laminectomy/Foraminotomy;  Surgeon: Karn Cassis, MD;  Location: [facility];  Service: Neurosurgery;  Laterality:  Right;  Right L5-S1 Laminectomy/Foraminotomy  . LUMBAR WOUND DEBRIDEMENT N/A 05/01/2014   Procedure: EXPLORATION OF LUMBAR WOUND;  Surgeon: Karn Cassis, MD;  Location: [facility];  Service: Neurosurgery;  Laterality: N/A;  EXPLORATION OF LUMBAR WOUND  . LUMBAR WOUND DEBRIDEMENT N/A 05/08/2014   Procedure: LUMBAR WOUND Exploration;  Surgeon: Karn Cassis, MD;  Location: [facility];  Service: Neurosurgery;  Laterality: N/A;  . NECK SURGERY       Family History: Family History    Problem Relation Age of Onset  . Leukemia Father   . Cancer Father        leukemia  . Diabetes Maternal Grandmother   . Rheum arthritis Maternal Grandmother   . Heart disease Paternal Grandmother   . Rheum arthritis Mother   . Heart disease Mother        2 coronary stents  . Heart disease Maternal Uncle   . Miscarriages / Stillbirths Maternal Uncle   . Heart disease Maternal Uncle   . Colon cancer Paternal Aunt 20  . Heart disease Maternal Aunt   . Heart disease Maternal Aunt    No known IBD  Social History: Social History   Socioeconomic History  . Marital status: Single    Spouse name: None  . Number of children: None  . Years of education: None  . Highest education level: None  Social Needs  . Financial resource strain: None  . Food insecurity - worry: None  . Food insecurity - inability: None  . Transportation needs - medical: None  . Transportation needs - non-medical: None  Occupational History  . None  Tobacco Use  . Smoking status: Former Smoker    Packs/day: 0.50    Years: 10.00    Pack years: 5.00    Types: Cigarettes    Last attempt to quit: 05/03/1996    Years since quitting: 21.1  . Smokeless tobacco: Never Used  . Tobacco comment: quit smoking 59yrs ago  Substance and Sexual Activity  . Alcohol use: No    Alcohol/week: 0.0 oz  . Drug use: No  . Sexual activity: Not Currently    Birth control/protection: Surgical  Other Topics Concern  . None  Social History Narrative  . None   She has never had heavy alcohol abuse or history of pancreatitis.  Allergies: Allergies  Allergen Reactions  . Hydrocodone Itching    Outpatient Meds: Current Outpatient Medications  Medication Sig Dispense Refill  . DULoxetine (CYMBALTA) 30 MG capsule TAKE 1 CAPSULE BY MOUTH 2 TIMES DAILY FOR DEPRESSION 180 capsule 0  . ferrous sulfate 325 (65 FE) MG tablet Take 700 mg by mouth daily with breakfast.    . hydrochlorothiazide (HYDRODIURIL) 25 MG tablet TAKE 1  TABLET BY MOUTH EVERY DAY FOR HIGH BLOOD PRESSURE 90 tablet 3  . Ibuprofen-Famotidine (DUEXIS) 800-26.6 MG TABS Take by mouth.    . leflunomide (ARAVA) 20 MG tablet Take 20 mg by mouth daily.    Marland Kitchen loratadine (CLARITIN) 10 MG tablet TAKE 1 TABLET (10 MG TOTAL) BY MOUTH DAILY. 90 tablet 1  . losartan (COZAAR) 50 MG tablet Take 1 tablet (50 mg total) by mouth daily. 90 tablet 0  . omeprazole (PRILOSEC) 20 MG capsule TAKE 1 CAPSULE BY MOUTH EVERY DAY 90 capsule 1  . traZODone (DESYREL) 100 MG tablet TAKE 2 TABLETS BY MOUTH AT BEDTIME AS NEEDED FOR SLEEP 180 tablet 1  . PEG-KCl-NaCl-NaSulf-Na Asc-C (PLENVU) 140 g SOLR Take 140 g by mouth as  directed. 1 each 0   No current facility-administered medications for this visit.     No longer on enbrel Losartan for > 1 yr PPI for yrs  ___________________________________________________________________ Objective   Exam:  There were no vitals taken for this visit.   General: this is a(n) well-appearing woman  Eyes: sclera anicteric, no redness  ENT: oral mucosa moist without lesions, no cervical or supraclavicular lymphadenopathy, good dentition  CV: RRR without murmur, S1/S2, no JVD, no peripheral edema  Resp: clear to auscultation bilaterally, normal RR and effort noted  GI: soft, mild LUQ tenderness, with active bowel sounds. No guarding or palpable organomegaly noted.  Skin; warm and dry, no rash or jaundice noted  Neuro: awake, alert and oriented x 3. Normal gross motor function and fluent speech  Labs:  CMP Latest Ref Rng & Units 06/22/2017 04/19/2017 01/18/2017  Glucose 65 - 99 mg/dL 91 - 195(K)  BUN 8 - 27 mg/dL 12 17 14   Creatinine 0.57 - 1.00 mg/dL 0.8 9.32  Sodium 6.71 - 144 mmol/L 142 140 141  Potassium 3.5 - 5.2 mmol/L 3.1(L) 3.6 3.2(L)  Chloride 96 - 106 mmol/L 98 - 95(L)  CO2 20 - 29 mmol/L 26 - 29  Calcium 8.7 - 10.3 mg/dL 9.1 - 8.9  Total Protein 6.0 - 8.5 g/dL 6.9 - 6.6  Total Bilirubin 0.0 - 1.2 mg/dL 0.3 -  0.4  Alkaline Phos 39 - 117 IU/L 126(H) 129(A) 106  AST 0 - 40 IU/L 28 81(A) 24  ALT 0 - 32 IU/L 53(H) 151(A) 56(H)    CBC Latest Ref Rng & Units 06/22/2017 04/19/2017 01/18/2017  WBC 3.4 - 10.8 x10E3/uL 6.8 7.0 7.1  Hemoglobin 11.1 - 15.9 g/dL 01/20/2017 80.9 98.3  Hematocrit 34.0 - 46.6 % 40.1 42 38.8  Platelets 150 - 379 x10E3/uL 309 295 -   02/2016  Hgb 10.4, MCV 76 Normal iron studies 01/2017  Stool cards negative for occult blood September 2018  nml TSH 01/2017 Hb A1C nml  Radiologic Studies:  Abd 01/2017 07/01/17:  S/p chole, hepatic steatosis  Assessment: Encounter Diagnoses  Name Primary?  . Chronic diarrhea Yes  . LUQ pain   . LFT elevation     Left upper quadrant pain and chronic diarrhea for a few months of unclear cause. Modest elevation of LFTs, now back to her baseline, which is most likely from fatty liver.  The cause for the transient elevation is uncertain, but she is back to baseline.  Plan:  Colonoscopy.  She is agreeable after discussion of procedure and risks.  The benefits and risks of the planned procedure were described in detail with the patient or (when appropriate) their health care proxy.  Risks were outlined as including, but not limited to, bleeding, infection, perforation, adverse medication reaction leading to cardiac or pulmonary decompensation, or pancreatitis (if ERCP).  The limitation of incomplete mucosal visualization was also discussed.  No guarantees or warranties were given.  Stool infectious studies.  Thank you for the courtesy of this consult.  Please call me with any questions or concerns.  08/31/17 III  CC: Charlie Pitter, DO

## 2017-07-05 NOTE — Patient Instructions (Signed)
If you are age 64 or older, your body mass index should be between 23-30. Your There is no height or weight on file to calculate BMI. If this is out of the aforementioned range listed, please consider follow up with your Primary Care Provider.  If you are age 52 or younger, your body mass index should be between 19-25. Your There is no height or weight on file to calculate BMI. If this is out of the aformentioned range listed, please consider follow up with your Primary Care Provider.   You have been scheduled for a colonoscopy. Please follow written instructions given to you at your visit today.  Please pick up your prep supplies at the pharmacy within the next 1-3 days. If you use inhalers (even only as needed), please bring them with you on the day of your procedure. Your physician has requested that you go to www.startemmi.com and enter the access code given to you at your visit today. This web site gives a general overview about your procedure. However, you should still follow specific instructions given to you by our office regarding your preparation for the procedure.  Thank you for choosing [facility]  Dr Amada Jupiter III

## 2017-07-06 LAB — TISSUE TRANSGLUTAMINASE, IGA: (TTG) AB, IGA: 1 U/mL

## 2017-07-08 ENCOUNTER — Encounter: Payer: Self-pay | Admitting: Gastroenterology

## 2017-07-11 ENCOUNTER — Other Ambulatory Visit: Payer: Federal, State, Local not specified - Other

## 2017-07-11 ENCOUNTER — Other Ambulatory Visit: Payer: BLUE CROSS/BLUE SHIELD

## 2017-07-11 ENCOUNTER — Telehealth: Payer: Self-pay | Admitting: Gastroenterology

## 2017-07-11 ENCOUNTER — Telehealth: Payer: Self-pay | Admitting: Family Medicine

## 2017-07-11 DIAGNOSIS — K529 Noninfective gastroenteritis and colitis, unspecified: Secondary | ICD-10-CM

## 2017-07-11 DIAGNOSIS — R1012 Left upper quadrant pain: Secondary | ICD-10-CM

## 2017-07-11 DIAGNOSIS — R7989 Other specified abnormal findings of blood chemistry: Secondary | ICD-10-CM

## 2017-07-11 DIAGNOSIS — R945 Abnormal results of liver function studies: Secondary | ICD-10-CM

## 2017-07-11 NOTE — Telephone Encounter (Signed)
Patient states prep plenvu is not covered by ins for procedure this Friday 3.15.19. Pt had ov 3.5.19

## 2017-07-11 NOTE — Telephone Encounter (Signed)
Patient states she is out of her trazadone prescription and needs a [facility]

## 2017-07-11 NOTE — Telephone Encounter (Signed)
Called and spoke to the patient and [facility] and patient has picked up her refill. MPulliam, CMA/RT(R)

## 2017-07-11 NOTE — Telephone Encounter (Signed)
Coupon sent to the pharmacy. Pt. is aware.

## 2017-07-12 LAB — CLOSTRIDIUM DIFFICILE BY PCR: CDIFFPCR: NEGATIVE

## 2017-07-13 LAB — OVA AND PARASITE EXAMINATION
CONCENTRATE RESULT: NONE SEEN
SPECIMEN QUALITY:: ADEQUATE
TRICHROME RESULT:: NONE SEEN
VKL: 90306885

## 2017-07-15 ENCOUNTER — Encounter: Payer: Self-pay | Admitting: Gastroenterology

## 2017-07-15 ENCOUNTER — Other Ambulatory Visit: Payer: Self-pay

## 2017-07-15 ENCOUNTER — Ambulatory Visit (AMBULATORY_SURGERY_CENTER): Payer: BLUE CROSS/BLUE SHIELD | Admitting: Gastroenterology

## 2017-07-15 VITALS — BP 119/72 | HR 72 | Temp 97.3°F | Resp 12 | Ht 62.0 in | Wt 213.0 lb

## 2017-07-15 DIAGNOSIS — R1012 Left upper quadrant pain: Secondary | ICD-10-CM

## 2017-07-15 DIAGNOSIS — K529 Noninfective gastroenteritis and colitis, unspecified: Secondary | ICD-10-CM

## 2017-07-15 DIAGNOSIS — K599 Functional intestinal disorder, unspecified: Secondary | ICD-10-CM | POA: Diagnosis not present

## 2017-07-15 MED ORDER — SODIUM CHLORIDE 0.9 % IV SOLN: 500.0000 mL | Freq: Once | INTRAVENOUS | Status: AC

## 2017-07-15 NOTE — Progress Notes (Signed)
Called to room to assist during endoscopic procedure.  Patient ID and intended procedure confirmed with present staff. Received instructions for my participation in the procedure from the performing physician.  

## 2017-07-15 NOTE — Patient Instructions (Signed)
Impression/Recommendations:  Resume previous diet. Continue present medications.  Await pathology results.  Repeat colonoscopy in 10 years for screening purposes.  YOU HAD AN ENDOSCOPIC PROCEDURE TODAY AT THE [facility]:   Refer to the procedure report that was given to you for any specific questions about what was found during the examination.  If the procedure report does not answer your questions, please call your gastroenterologist to clarify.  If you requested that your care partner not be given the details of your procedure findings, then the procedure report has been included in a sealed envelope for you to review at your convenience later.  YOU SHOULD EXPECT: Some feelings of bloating in the abdomen. Passage of more gas than usual.  Walking can help get rid of the air that was put into your GI tract during the procedure and reduce the bloating. If you had a lower endoscopy (such as a colonoscopy or flexible sigmoidoscopy) you may notice spotting of blood in your stool or on the toilet paper. If you underwent a bowel prep for your procedure, you may not have a normal bowel movement for a few days.  Please Note:  You might notice some irritation and congestion in your nose or some drainage.  This is from the oxygen used during your procedure.  There is no need for concern and it should clear up in a day or so.  SYMPTOMS TO REPORT IMMEDIATELY:   Following lower endoscopy (colonoscopy or flexible sigmoidoscopy):  Excessive amounts of blood in the stool  Significant tenderness or worsening of abdominal pains  Swelling of the abdomen that is new, acute  Fever of 100F or higher For urgent or emergent issues, a gastroenterologist can be reached at any hour by calling [phone].   DIET:  We do recommend a small meal at first, but then you may proceed to your regular diet.  Drink plenty of fluids but you should avoid alcoholic beverages for 24 hours.  ACTIVITY:  You  should plan to take it easy for the rest of today and you should NOT DRIVE or use heavy machinery until tomorrow (because of the sedation medicines used during the test).    FOLLOW UP: Our staff will call the number listed on your records the next business day following your procedure to check on you and address any questions or concerns that you may have regarding the information given to you following your procedure. If we do not reach you, we will leave a message.  However, if you are feeling well and you are not experiencing any problems, there is no need to return our call.  We will assume that you have returned to your regular daily activities without incident.  If any biopsies were taken you will be contacted by phone or by letter within the next 1-3 weeks.  Please call us at [phone] if you have not heard about the biopsies in 3 weeks.    SIGNATURES/CONFIDENTIALITY: You and/or your care partner have signed paperwork which will be entered into your electronic medical record.  These signatures attest to the fact that that the information above on your After Visit Summary has been reviewed and is understood.  Full responsibility of the confidentiality of this discharge information lies with you and/or your care-partner.

## 2017-07-15 NOTE — Op Note (Signed)
[facility] Patient Name: [name] Procedure Date: 07/15/2017 2:42 PM MRN: [id number] Endoscopist: [name] , MD Age: 62 Referring MD:  Date of Birth: 31-Dec-1955 Gender: Female Account #: [id number] Procedure:                Colonoscopy Indications:              Chronic diarrhea Medicines:                Monitored Anesthesia Care Procedure:                Pre-Anesthesia Assessment:                           - Prior to the procedure, a History and Physical                            was performed, and patient medications and                            allergies were reviewed. The patient's tolerance of                            previous anesthesia was also reviewed. The risks                            and benefits of the procedure and the sedation                            options and risks were discussed with the patient.                            All questions were answered, and informed consent                            was obtained. Prior Anticoagulants: The patient has                            taken no previous anticoagulant or antiplatelet                            agents. ASA Grade Assessment: II - A patient with                            mild systemic disease. After reviewing the risks                            and benefits, the patient was deemed in                            satisfactory condition to undergo the procedure.                           After obtaining informed consent, the colonoscope  was passed under direct vision. Throughout the                            procedure, the patient's blood pressure, pulse, and                            oxygen saturations were monitored continuously. The                            Colonoscope was introduced through the anus and                            advanced to the the cecum, identified by                            appendiceal orifice and ileocecal valve. The                             colonoscopy was performed without difficulty. The                            patient tolerated the procedure well. The quality                            of the bowel preparation was excellent. The                            ileocecal valve, appendiceal orifice, and rectum                            were photographed. Scope In: 2:45:46 PM Scope Out: 2:59:47 PM Scope Withdrawal Time: 0 hours 8 minutes 11 seconds  Total Procedure Duration: 0 hours 14 minutes 1 second  Findings:                 The perianal and digital rectal examinations were                            normal.                           Normal mucosa was found in the entire colon.                            Biopsies for histology were taken with a cold                            forceps from the right colon and left colon for                            evaluation of microscopic colitis.                           The sigmoid colon was tortuous and redundant,  precluding intubation of ther terminal ileum.                           The exam was otherwise without abnormality on                            direct and retroflexion views. Complications:            No immediate complications. Estimated Blood Loss:     Estimated blood loss was minimal. Impression:               - Normal mucosa in the entire examined colon.                            Biopsied.                           - Tortuous colon.                           - The examination was otherwise normal on direct                            and retroflexion views. Recommendation:           - Patient has a contact number available for                            emergencies. The signs and symptoms of potential                            delayed complications were discussed with the                            patient. Return to normal activities tomorrow.                            Written discharge instructions were provided to the                             patient.                           - Resume previous diet.                           - Continue present medications.                           - Await pathology results.                           - Repeat colonoscopy in 10 years for screening                            purposes. Betty Grogan L. Myrtie Neither, MD 07/15/2017 3:11:48 PM This report has been signed electronically.

## 2017-07-15 NOTE — Progress Notes (Signed)
A and O x3. Report to RN. Tolerated MAC anesthesia well.

## 2017-07-18 ENCOUNTER — Telehealth: Payer: Self-pay

## 2017-07-18 NOTE — Telephone Encounter (Signed)
  Follow up Call-  Call back number 07/15/2017  Post procedure Call Back phone  # [phone]  Permission to leave phone message Yes  Some recent data might be hidden     Patient questions:  Do you have a fever, pain , or abdominal swelling? No. Pain Score  0 *  Have you tolerated food without any problems? Yes.    Have you been able to return to your normal activities? Yes.    Do you have any questions about your discharge instructions: Diet   No. Medications  No. Follow up visit  No.  Do you have questions or concerns about your Care? No.  Actions: * If pain score is 4 or above: No action needed, pain <4.

## 2017-07-25 ENCOUNTER — Other Ambulatory Visit: Payer: Self-pay

## 2017-07-25 MED ORDER — METRONIDAZOLE 500 MG PO TABS: 500.0000 mg | ORAL_TABLET | Freq: Three times a day (TID) | ORAL | 0 refills | Status: DC

## 2017-08-03 ENCOUNTER — Other Ambulatory Visit: Payer: Self-pay

## 2017-08-03 MED ORDER — FERROUS SULFATE 325 (65 FE) MG PO TABS: 325.0000 mg | ORAL_TABLET | Freq: Every day | ORAL | Status: DC

## 2017-08-03 MED ORDER — HYDROXYCHLOROQUINE SULFATE 200 MG PO TABS: 400.0000 mg | ORAL_TABLET | Freq: Every day | ORAL | 1 refills | Status: DC

## 2017-08-03 MED ORDER — IBUPROFEN-FAMOTIDINE 800-26.6 MG PO TABS: 2.0000 | ORAL_TABLET | Freq: Every day | ORAL | Status: DC

## 2017-08-03 NOTE — Progress Notes (Signed)
Updated patient medication list. MPulliam, CMA/RT(R)

## 2017-08-08 ENCOUNTER — Encounter: Payer: Self-pay | Admitting: Family Medicine

## 2017-08-08 ENCOUNTER — Ambulatory Visit (INDEPENDENT_AMBULATORY_CARE_PROVIDER_SITE_OTHER): Payer: BLUE CROSS/BLUE SHIELD | Admitting: Family Medicine

## 2017-08-08 VITALS — BP 129/80 | HR 70 | Ht 63.0 in | Wt 203.8 lb

## 2017-08-08 DIAGNOSIS — M48061 Spinal stenosis, lumbar region without neurogenic claudication: Secondary | ICD-10-CM

## 2017-08-08 DIAGNOSIS — E782 Mixed hyperlipidemia: Secondary | ICD-10-CM

## 2017-08-08 DIAGNOSIS — M797 Fibromyalgia: Secondary | ICD-10-CM | POA: Diagnosis not present

## 2017-08-08 DIAGNOSIS — I10 Essential (primary) hypertension: Secondary | ICD-10-CM | POA: Diagnosis not present

## 2017-08-08 DIAGNOSIS — M9983 Other biomechanical lesions of lumbar region: Secondary | ICD-10-CM

## 2017-08-08 DIAGNOSIS — G894 Chronic pain syndrome: Secondary | ICD-10-CM

## 2017-08-08 NOTE — Progress Notes (Signed)
Impression and Recommendations:    1. Essential hypertension   2. Mixed hyperlipidemia   3. Fibromyalgia   4. Chronic pain syndrome   5. Foraminal stenosis of lumbar region     1. Essential HTN- -BP stable. Continue meds and diet/exercise. Check your BP at home and bring a log into next OV.  2. Mixed HLD- -Continue diet and exercise. Pt has always declined medications. Recheck q 12 months since pt declines change in treatment plan.   Last lipid panel as follows:  Lab Results  Component Value Date   CHOL 233 (H) 01/18/2017   HDL 43 01/18/2017   LDLCALC 148 (H) 01/18/2017   TRIG 209 (H) 01/18/2017   CHOLHDL 5.4 (H) 01/18/2017    Hepatic Function Latest Ref Rng & Units 06/22/2017 04/19/2017 01/18/2017  Total Protein 6.0 - 8.5 g/dL 6.9 - 6.6  Albumin 3.6 - 4.8 g/dL 4.2 - 4.0  AST 0 - 40 IU/L 28 81(A) 24  ALT 0 - 32 IU/L 53(H) 151(A) 56(H)  Alk Phosphatase 39 - 117 IU/L 126(H) 129(A) 106  Total Bilirubin 0.0 - 1.2 mg/dL 0.3 - 0.4  Bilirubin, Direct 0.00 - 0.40 mg/dL 0.14 - -    3. Fibromyalgia- -Continue meds as pt is tolerating this well.  4. Diarrhea- She has seen Dr. Loletha Carrow of GI with negative US abdomen and colonoscopy 07-15-17, with results WNL. Pt refuses to change BP meds at this time, which was recommended by GI doctor that possibly could be causing diarrhea, but pt declines.   5. Chronic pain syndrome 6. Foraminal stenosis of lumbar region -Ambulatory referral given to Interventional Pain Management. Pt has seen [facility] before and is requesting another referral for her chronic back pain.   Orders Placed This Encounter  Procedures  . Ambulatory referral to Pain Clinic    No orders of the defined types were placed in this encounter.   Gross side effects, risk and benefits, and alternatives of medications and treatment plan in general discussed with patient.  Patient is aware that all medications have potential side effects  and we are unable to predict every side effect or drug-drug interaction that may occur.   Patient will call with any questions prior to using medication if they have concerns.  Expresses verbal understanding and consents to current therapy and treatment regimen.  No barriers to understanding were identified.  Red flag symptoms and signs discussed in detail.  Patient expressed understanding regarding what to do in case of emergency\urgent symptoms  Please see AVS handed out to patient at the end of our visit for further patient instructions/ counseling done pertaining to today's office visit.   Return in about 6 months (around 02/07/2018).    Note: This note was prepared with assistance of Dragon voice recognition software. Occasional wrong-word or sound-a-like substitutions may have occurred due to the inherent limitations of voice recognition software.  This document serves as a record of services personally performed by Mellody Dance, DO. It was created on her behalf by Mayer Masker, a trained medical scribe. The creation of this record is based on the scribe's personal observations and the provider's statements to them.   I have reviewed the above medical documentation for accuracy and completeness and I concur.  Mellody Dance 08/08/17 11:20 AM   -------------------------------------------------------------------------------------------------------------------------------------------------------------------------------------------------------------------------------------------- Subjective:     HPI: [name] is a 62 y.o. female who presents to [place] [facility] today for issues as discussed  below.  GI issues She had an [place] of abdomen and colonoscopy with GI doc, but results WNL. She is still having diarrhea. Her GI doctor told her some of her BP or arthritis/fibromyalgia medications may still be causing her symptoms. She is not terribly worried about this and is  going to continue her meds. Her colonoscopy was WNL and she is due for repeat in 10 years.  Diet/exercise She has been eating more vegetables and fruits, as well a less fast food and processed foods. She has stopped eating danishes and blueberry muffins. She has reduced her consumption of fatty foods.   She has not been exercising. She states she has difficulty walking long distances due to pain in her back.   HTN HPI: -  Her blood pressure has been controlled at home.  Pt is not checking it at home.   - Patient reports good compliance with blood pressure medications  - Denies medication S-E   - Smoking Status noted   - She denies new onset of: chest pain, exercise intolerance, shortness of breath, dizziness, visual changes, headache, lower extremity swelling or claudication.   Back She has a h/o osteoarthritis in her spine. She is also requesting a handicap sticker because she has difficulty walking long distances. She has lower back pain and this is changing her daily quality of living. She has been to [facility] (last seen: last year), as well as [facility], but they told her it "is something she has to live with" and she does not care to see them again. She has not had any injections in her back. She has not been to physical therapy. She has had an MRI and CT scan of her lumbar spine back on 05-08-14. Her pain is getting worse and not better.  She has had a h/o chronic neck pain and had neck injections before with neurology with an unknown provider.    Last 3 blood pressure readings in our office are as follows: BP Readings from Last 3 Encounters:  08/08/17 129/80  07/15/17 119/72  06/22/17 119/82    Filed Weights   08/08/17 0943  Weight: 203 lb 12.8 oz (92.4 kg)     Wt Readings from Last 3 Encounters:  08/08/17 203 lb 12.8 oz (92.4 kg)  07/15/17 213 lb (96.6 kg)  06/22/17 213 lb 14.4 oz (97 kg)   BP Readings from Last 3 Encounters:  08/08/17 129/80    07/15/17 119/72  06/22/17 119/82   Pulse Readings from Last 3 Encounters:  08/08/17 70  07/15/17 72  06/22/17 83   BMI Readings from Last 3 Encounters:  08/08/17 36.10 kg/m  07/15/17 38.96 kg/m  06/22/17 39.12 kg/m     Patient Care Team    Relationship Specialty Notifications Start End  Mellody Dance, DO PCP - General Family Medicine  03/11/16   Leeroy Cha, MD Consulting Physician Neurosurgery  03/11/16   Valinda Party, MD Consulting Physician Rheumatology  03/11/16   Normajean Glasgow, MD Attending Physician Physical Medicine and Rehabilitation  10/04/16    Comment: INjections in back/ neck-- part of [facility] Chamberlain, [place] [facility] Specialist  01/06/17    Comment: treating her for chronic neck pain- last seen this june/ july.   [facility] Consulting Physician Dermatology  01/18/17      Patient Active Problem List   Diagnosis Date Noted  . Obesity 03/11/2016    Priority: High  . Mixed hyperlipidemia 03/11/2016    Priority: High  .  Essential hypertension 06/16/2012    Priority: High  . Chronic pain syndrome 10/04/2016    Priority: Medium  . Gastroesophageal reflux disease 03/14/2016    Priority: Medium  . Iron (Fe) deficiency anemia 03/14/2016    Priority: Medium  . Major depressive disorder, single episode, severe without psychotic features ([place])     Priority: Medium  . Fibromyalgia 02/19/2014    Priority: Medium  . Arthritis of carpometacarpal (CMC) joints of both thumbs 09/12/2013    Priority: Medium  . Migraine, unspecified, without mention of intractable migraine without mention of status migrainosus 06/16/2012    Priority: Medium  . h/o Elevated fasting blood sugar 10/04/2016    Priority: Low  . chronic Elevated liver function tests     Priority: Low  . Family history of diabetes mellitus 07/19/2016    Priority: Low  . Environmental and seasonal allergies 03/14/2016    Priority: Low  . Generalized anxiety disorder  08/23/2012    Priority: Low  . Insomnia 08/14/2012    Priority: Low  . Chronic diarrhea 06/22/2017  . Bilateral carpal tunnel syndrome 04/07/2017  . Bilateral hand pain 04/07/2017  . Trigger middle finger of right hand 04/07/2017  . Hx of total hysterectomy 01/18/2017  . Thoracic back pain 12/28/2016  . Encounter for long-term (current) use of high-risk medication 11/08/2015  . Rheumatoid arthritis ([place]) 06/22/2014  . 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 Hypokalemia 09/18/2013  . Family history of ischemic heart disease   . Episode of dizziness after NTG while walking to [place] 09/17/2013    Past Medical history, Surgical history, Family history, Social history, Allergies and Medications have been entered into the medical record, reviewed and changed as needed.    Current Meds  Medication Sig  . DULoxetine (CYMBALTA) 30 MG capsule TAKE 1 CAPSULE BY MOUTH 2 TIMES DAILY FOR DEPRESSION  . ferrous sulfate 325 (65 FE) MG tablet Take 1 tablet (325 mg total) by mouth daily with breakfast.  . hydrochlorothiazide (HYDRODIURIL) 25 MG tablet TAKE 1 TABLET BY MOUTH EVERY DAY FOR HIGH BLOOD PRESSURE  . hydroxychloroquine (PLAQUENIL) 200 MG tablet Take 2 tablets (400 mg total) by mouth daily.  . Ibuprofen-Famotidine (DUEXIS) 800-26.6 MG TABS Take 2 tablets by mouth daily.  Marland Kitchen leflunomide (ARAVA) 20 MG tablet Take 20 mg by mouth daily.  Marland Kitchen loratadine (CLARITIN) 10 MG tablet TAKE 1 TABLET (10 MG TOTAL) BY MOUTH DAILY.  Marland Kitchen losartan (COZAAR) 50 MG tablet Take 1 tablet (50 mg total) by mouth daily.  . metroNIDAZOLE (FLAGYL) 500 MG tablet Take 1 tablet (500 mg total) by mouth 3 (three) times daily.  Marland Kitchen omeprazole (PRILOSEC) 20 MG capsule TAKE 1 CAPSULE BY MOUTH EVERY DAY  . traZODone (DESYREL) 100 MG tablet TAKE 2 TABLETS BY MOUTH AT BEDTIME AS NEEDED FOR SLEEP   Current Facility-Administered Medications for the 08/08/17 encounter (Office Visit) with Mellody Dance, DO  Medication  . 0.9 %   sodium chloride infusion    Allergies:  Allergies  Allergen Reactions  . Hydrocodone Itching     Review of Systems:  A fourteen system review of systems was performed and found to be positive as per HPI.   Objective:   Blood pressure 129/80, pulse 70, height '5\' 3"'$  (1.6 m), weight 203 lb 12.8 oz (92.4 kg), SpO2 96 %. Body mass index is 36.1 kg/m. General:  Well Developed, well nourished, appropriate for stated age.  Neuro:  Alert and oriented,  extra-ocular muscles intact  HEENT:  Normocephalic,  atraumatic, neck supple, no carotid bruits appreciated  Skin:  no gross rash, warm, pink. Cardiac:  RRR, S1 S2 Respiratory:  ECTA B/L and A/P, Not using accessory muscles, speaking in full sentences- unlabored. Vascular:  Ext warm, no cyanosis apprec.; cap RF less 2 sec. Psych:  No HI/SI, judgement and insight good, Euthymic mood. Full Affect.

## 2017-08-08 NOTE — Patient Instructions (Signed)
We referred you to chronic pain management in [place].  Dr. Haskel Khan or Dr. Roderic Ovens.  If you do not hear anything from [place] or them within the next week please call and speak with Joselyn Glassman at our front desk and inquire about this as he does our referrals.

## 2017-08-12 ENCOUNTER — Ambulatory Visit (INDEPENDENT_AMBULATORY_CARE_PROVIDER_SITE_OTHER): Payer: BLUE CROSS/BLUE SHIELD | Admitting: Family Medicine

## 2017-08-12 ENCOUNTER — Encounter: Payer: Self-pay | Admitting: Family Medicine

## 2017-08-12 VITALS — BP 117/78 | HR 76 | Ht 63.0 in | Wt 203.0 lb

## 2017-08-12 DIAGNOSIS — M797 Fibromyalgia: Secondary | ICD-10-CM

## 2017-08-12 DIAGNOSIS — G894 Chronic pain syndrome: Secondary | ICD-10-CM

## 2017-08-12 DIAGNOSIS — M069 Rheumatoid arthritis, unspecified: Secondary | ICD-10-CM

## 2017-08-12 DIAGNOSIS — F322 Major depressive disorder, single episode, severe without psychotic features: Secondary | ICD-10-CM

## 2017-08-12 DIAGNOSIS — M48061 Spinal stenosis, lumbar region without neurogenic claudication: Secondary | ICD-10-CM

## 2017-08-12 DIAGNOSIS — M9983 Other biomechanical lesions of lumbar region: Secondary | ICD-10-CM | POA: Diagnosis not present

## 2017-08-12 MED ORDER — PREGABALIN 75 MG PO CAPS: 75.0000 mg | ORAL_CAPSULE | Freq: Two times a day (BID) | ORAL | 3 refills | Status: DC

## 2017-08-12 MED ORDER — CYCLOBENZAPRINE HCL 10 MG PO TABS: 10.0000 mg | ORAL_TABLET | Freq: Three times a day (TID) | ORAL | 0 refills | Status: DC | PRN

## 2017-08-12 NOTE — Progress Notes (Signed)
Pt here for an acute care OV today   Impression and Recommendations:    1. Foraminal stenosis of lumbar region   2. Chronic pain syndrome   3. Fibromyalgia   4. Major depressive disorder, single episode, severe without psychotic features (HCC)   5. Rheumatoid arthritis, involving unspecified site, unspecified rheumatoid factor presence (HCC)     1. Foraminal stenosis of lumbar region -Start flexeril -Take OTC tylenol q 6 hours. -Keep your existing appointment with interventional pain management. She has seen [facility] with MRI done in Nov 2018. Told pt to have her imaging results sent to our office from them for our records.  2. Chronic pain syndrome -Start elavil 3. Fibromyalgia -Continue taking meds as listed below.   4. Major depressive disorder -Increase cymbalta from 30 mg QD to 30mg  BID. Per pt, she only takes 30mg  QD currently.  5. RA  -Pt sees Dr. Kathi Ludwig, rheumatology. -Continue taking duoexis q 12 hours.  -Ambulatory referral given to physical therapy.   Meds ordered this encounter  Medications  . pregabalin (LYRICA) 75 MG capsule    Sig: Take 1 capsule (75 mg total) by mouth 2 (two) times daily.    Dispense:  60 capsule    Refill:  3  . cyclobenzaprine (FLEXERIL) 10 MG tablet    Sig: Take 1 tablet (10 mg total) by mouth 3 (three) times daily as needed for muscle spasms.    Dispense:  30 tablet    Refill:  0    No orders of the defined types were placed in this encounter.    Education and routine counseling performed. Handouts provided  Gross side effects, risk and benefits, and alternatives of medications and treatment plan in general discussed with patient.  Patient is aware that all medications have potential side effects and we are unable to predict every side effect or drug-drug interaction that may occur.   Patient will call with any questions prior to using medication if they have concerns.  Expresses verbal understanding and consents to  current therapy and treatment regimen.  No barriers to understanding were identified.  Red flag symptoms and signs discussed in detail.  Patient expressed understanding regarding what to do in case of emergency\urgent symptoms   Please see AVS handed out to patient at the end of our visit for further patient instructions/ counseling done pertaining to today's office visit.   Return if symptoms worsen or fail to improve, for and for chronic care as previously discussed.     Note: This document was prepared occasionally using Dragon voice recognition software and may include unintentional dictation errors in addition to a scribe.   This document serves as a record of services personally performed by Thomasene Lot, DO. It was created on her behalf by Thelma Barge, a trained medical scribe. The creation of this record is based on the scribe's personal observations and the provider's statements to them.   I have reviewed the above medical documentation for accuracy and completeness and I concur.  Thomasene Lot 08/21/17 7:42 PM   --------------------------------------------------------------------------------------------------------------------------------------------------------------------------------------------------------------------------------------------    Subjective:    CC:  Chief Complaint  Patient presents with  . Follow-up    HPI: [name] is a 62 y.o. female who presents to [facility] today for issues as discussed below.  Back She complains of L-sided lower back pain that began suddenly. Her pain shoots down into her leg. She also has L breast pain that she describes as "  a monkey sitting on her chest". The pain is worse when walking and also when sitting. When she moves a certain way, her L back side burns all the way down her L leg. She has not taken tylenol. She does not want to take "hard core" drugs. She takes duoexis BID. She has had  an MRI done last year in Nov 2018.   She is not able to get in with her neurosurgeon or at the pain clinic until May. She states her pain is bad enough that she wants to go to the hospital.  She denies urinary or bowel incontinence. Pt has a h/o rheumatoid arthritis and is managed by Dr. Kathi Ludwig.   Mood Per pt, she only takes 30mg  cymbalta QD.   No problems updated.   Wt Readings from Last 3 Encounters:  08/12/17 203 lb (92.1 kg)  08/08/17 203 lb 12.8 oz (92.4 kg)  07/15/17 213 lb (96.6 kg)   BP Readings from Last 3 Encounters:  08/12/17 117/78  08/08/17 129/80  07/15/17 119/72   BMI Readings from Last 3 Encounters:  08/12/17 35.96 kg/m  08/08/17 36.10 kg/m  07/15/17 38.96 kg/m     Patient Care Team    Relationship Specialty Notifications Start End  07/17/17, DO PCP - General Family Medicine  03/11/16   13/9/17, MD Consulting Physician Neurosurgery  03/11/16   13/9/17, MD Consulting Physician Rheumatology  03/11/16   13/9/17, MD Attending Physician Physical Medicine and Rehabilitation  10/04/16    Comment: INjections in back/ neck-- part of 12/04/16, [facility] Consulting Physician Specialist  01/06/17    Comment: treating her for chronic neck pain- last seen this june/ july.   Center, [facility] Consulting Physician Dermatology  01/18/17      Patient Active Problem List   Diagnosis Date Noted  . Obesity 03/11/2016    Priority: High  . Mixed hyperlipidemia 03/11/2016    Priority: High  . Essential hypertension 06/16/2012    Priority: High  . Chronic pain syndrome 10/04/2016    Priority: Medium  . Gastroesophageal reflux disease 03/14/2016    Priority: Medium  . Iron (Fe) deficiency anemia 03/14/2016    Priority: Medium  . Major depressive disorder, single episode, severe without psychotic features (HCC)     Priority: Medium  . Fibromyalgia 02/19/2014    Priority: Medium  . Arthritis of carpometacarpal (CMC) joints of  both thumbs 09/12/2013    Priority: Medium  . Migraine, unspecified, without mention of intractable migraine without mention of status migrainosus 06/16/2012    Priority: Medium  . h/o Elevated fasting blood sugar 10/04/2016    Priority: Low  . chronic Elevated liver function tests     Priority: Low  . Family history of diabetes mellitus 07/19/2016    Priority: Low  . Environmental and seasonal allergies 03/14/2016    Priority: Low  . Generalized anxiety disorder 08/23/2012    Priority: Low  . Insomnia 08/14/2012    Priority: Low  . Chronic diarrhea 06/22/2017  . Bilateral carpal tunnel syndrome 04/07/2017  . Bilateral hand pain 04/07/2017  . Trigger middle finger of right hand 04/07/2017  . Hx of total hysterectomy 01/18/2017  . Thoracic back pain 12/28/2016  . Encounter for long-term (current) use of high-risk medication 11/08/2015  . Rheumatoid arthritis (HCC) 06/22/2014  . 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 Hypokalemia 09/18/2013  . Family history of ischemic heart disease   . Episode of dizziness after NTG  while walking to BR 09/17/2013    Past Medical history, Surgical history, Family history, Social history, Allergies and Medications have been entered into the medical record, reviewed and changed as needed.    Current Meds  Medication Sig  . DULoxetine (CYMBALTA) 30 MG capsule TAKE 1 CAPSULE BY MOUTH 2 TIMES DAILY FOR DEPRESSION  . ferrous sulfate 325 (65 FE) MG tablet Take 1 tablet (325 mg total) by mouth daily with breakfast.  . hydrochlorothiazide (HYDRODIURIL) 25 MG tablet TAKE 1 TABLET BY MOUTH EVERY DAY FOR HIGH BLOOD PRESSURE  . hydroxychloroquine (PLAQUENIL) 200 MG tablet Take 2 tablets (400 mg total) by mouth daily.  . Ibuprofen-Famotidine (DUEXIS) 800-26.6 MG TABS Take 2 tablets by mouth daily.  Marland Kitchen leflunomide (ARAVA) 20 MG tablet Take 20 mg by mouth daily.  Marland Kitchen loratadine (CLARITIN) 10 MG tablet TAKE 1 TABLET (10 MG TOTAL) BY MOUTH DAILY.  .  metroNIDAZOLE (FLAGYL) 500 MG tablet Take 1 tablet (500 mg total) by mouth 3 (three) times daily.  Marland Kitchen omeprazole (PRILOSEC) 20 MG capsule TAKE 1 CAPSULE BY MOUTH EVERY DAY  . traZODone (DESYREL) 100 MG tablet TAKE 2 TABLETS BY MOUTH AT BEDTIME AS NEEDED FOR SLEEP  . [DISCONTINUED] losartan (COZAAR) 50 MG tablet Take 1 tablet (50 mg total) by mouth daily.   Current Facility-Administered Medications for the 08/12/17 encounter (Office Visit) with Thomasene Lot, DO  Medication  . 0.9 %  sodium chloride infusion    Allergies:  Allergies  Allergen Reactions  . Hydrocodone Itching     Review of Systems: General:   Denies fever, chills, unexplained weight loss.  Optho/Auditory:   Denies visual changes, blurred vision/LOV Respiratory:   Denies wheeze, DOE more than baseline levels.  Cardiovascular:   Denies chest pain, palpitations, new onset peripheral edema  Gastrointestinal:   Denies nausea, vomiting, diarrhea, abd pain.  Genitourinary: Denies dysuria, freq/ urgency, flank pain or discharge from genitals.  Endocrine:     Denies hot or cold intolerance, polyuria, polydipsia. Musculoskeletal:   Denies unexplained myalgias, joint swelling, unexplained arthralgias, gait problems.  Skin:  Denies new onset rash, suspicious lesions Neurological:     Denies dizziness, unexplained weakness, numbness  Psychiatric/Behavioral:   Denies mood changes, suicidal or homicidal ideations, hallucinations    Objective:   Blood pressure 117/78, pulse 76, height 5\' 3"  (1.6 m), weight 203 lb (92.1 kg), SpO2 98 %. Body mass index is 35.96 kg/m. General:  Well Developed, well nourished, appropriate for stated age.  Neuro:  Alert and oriented,  extra-ocular muscles intact  HEENT:  Normocephalic, atraumatic, neck supple Skin:  no gross rash, warm, pink. Cardiac:  RRR, S1 S2 Respiratory:  ECTA B/L and A/P, Not using accessory muscles, speaking in full sentences- unlabored. Vascular:  Ext warm, no cyanosis  apprec.; cap RF less 2 sec. Psych:  No HI/SI, judgement and insight good, Euthymic mood. Full Affect. Msk-Exquisitely tender to light palpation of gluteal region on the L. No bony tenderness of spinus processes. Positive muscle spasms paravertebral T11-L1 on the R and lower L3-L5. Positive SLR B/L.

## 2017-08-12 NOTE — Patient Instructions (Signed)
We will increase her Cymbalta from 30 mg to 60 mg daily.  We will add Flexeril which is a muscle relaxer to your treatment regiment to help with your acute back pain flare.  We will also add Lyrica which will help with your fibromyalgia, low back pain with lumbar stenosis etc.    --It is extremely important part you get me that your recent MRI results from [facility] and also since they have treated you in the past for this, I recommend you follow-up with them.  It is important you keep your pain management appointment in the upcoming future but also follow-up with your other specialist that are treating your pain to see what else can be done.  Please also meet with your rheumatologist and make sure the Lyrica and increasing Cymbalta and etc.\med changes be made are okay with them.  Also add Tylenol in between your Duexis doses to help with the anti-inflammatory pain process.

## 2017-08-17 ENCOUNTER — Other Ambulatory Visit: Payer: Self-pay | Admitting: Family Medicine

## 2017-08-17 DIAGNOSIS — I10 Essential (primary) hypertension: Secondary | ICD-10-CM

## 2017-09-07 ENCOUNTER — Ambulatory Visit: Payer: Self-pay | Admitting: Gastroenterology

## 2017-09-13 ENCOUNTER — Ambulatory Visit: Payer: Self-pay | Admitting: Gastroenterology

## 2017-10-01 ENCOUNTER — Other Ambulatory Visit: Payer: Self-pay | Admitting: Family Medicine

## 2017-10-01 DIAGNOSIS — G4700 Insomnia, unspecified: Secondary | ICD-10-CM

## 2017-10-26 LAB — CBC AND DIFFERENTIAL
HCT: 39 (ref 36–46)
HCT: 39 (ref 36–46)
Hemoglobin: 12.9 (ref 12.0–16.0)
Hemoglobin: 12.9 (ref 12.0–16.0)
PLATELETS: 293 (ref 150–399)
Platelets: 293 (ref 150–399)
WBC: 6.9
WBC: 6.9

## 2017-10-26 LAB — BASIC METABOLIC PANEL
BUN: 10 (ref 4–21)
BUN: 16 (ref 4–21)
CREATININE: 0.8 (ref 0.5–1.1)
Creatinine: 1 (ref 0.5–1.1)
GLUCOSE: 110
Glucose: 106
Potassium: 2.9 — AB (ref 3.4–5.3)
Potassium: 3.7 (ref 3.4–5.3)
Sodium: 102 — AB (ref 137–147)
Sodium: 142 (ref 137–147)
Sodium: 143 (ref 137–147)

## 2017-10-26 LAB — HEPATIC FUNCTION PANEL
ALK PHOS: 153 — AB (ref 25–125)
ALT: 100 — AB (ref 7–35)
ALT: 87 — AB (ref 7–35)
AST: 51 — AB (ref 13–35)
AST: 71 — AB (ref 13–35)
Alkaline Phosphatase: 148 — AB (ref 25–125)
BILIRUBIN, TOTAL: 0.3
Bilirubin, Total: 0.3

## 2017-10-26 LAB — C-REACTIVE PROTEIN: CRP: 8.6

## 2017-10-26 LAB — POCT ERYTHROCYTE SEDIMENTATION RATE, NON-AUTOMATED: SED RATE: 12

## 2017-10-27 ENCOUNTER — Other Ambulatory Visit: Payer: Self-pay | Admitting: Family Medicine

## 2017-10-27 DIAGNOSIS — G4700 Insomnia, unspecified: Secondary | ICD-10-CM

## 2017-10-28 ENCOUNTER — Telehealth: Payer: Self-pay | Admitting: Family Medicine

## 2017-10-28 NOTE — Telephone Encounter (Signed)
Called patient left message to call the office. MPulliam, CMA/RT(R)  

## 2017-10-28 NOTE — Telephone Encounter (Signed)
Patient called states medical assistant called her left message to call office.  --forwarding message for patient to be called back.   --glh

## 2017-11-06 ENCOUNTER — Other Ambulatory Visit: Payer: Self-pay | Admitting: Family Medicine

## 2017-11-06 DIAGNOSIS — J302 Other seasonal allergic rhinitis: Secondary | ICD-10-CM

## 2017-11-23 ENCOUNTER — Other Ambulatory Visit: Payer: Self-pay | Admitting: Family Medicine

## 2017-11-23 DIAGNOSIS — G4700 Insomnia, unspecified: Secondary | ICD-10-CM

## 2017-12-20 ENCOUNTER — Ambulatory Visit: Payer: Self-pay | Admitting: Family Medicine

## 2017-12-28 ENCOUNTER — Other Ambulatory Visit: Payer: Self-pay | Admitting: Family Medicine

## 2017-12-28 DIAGNOSIS — G4700 Insomnia, unspecified: Secondary | ICD-10-CM

## 2018-01-04 ENCOUNTER — Telehealth: Payer: Self-pay | Admitting: Family Medicine

## 2018-01-04 ENCOUNTER — Other Ambulatory Visit: Payer: Self-pay | Admitting: Family Medicine

## 2018-01-04 ENCOUNTER — Other Ambulatory Visit: Payer: Self-pay

## 2018-01-04 DIAGNOSIS — G4700 Insomnia, unspecified: Secondary | ICD-10-CM

## 2018-01-04 MED ORDER — TRAZODONE HCL 100 MG PO TABS: ORAL_TABLET | ORAL | 0 refills | Status: DC

## 2018-01-04 NOTE — Telephone Encounter (Signed)
error 

## 2018-01-09 ENCOUNTER — Encounter: Payer: Self-pay | Admitting: Adult Health

## 2018-01-09 ENCOUNTER — Ambulatory Visit (INDEPENDENT_AMBULATORY_CARE_PROVIDER_SITE_OTHER): Payer: BLUE CROSS/BLUE SHIELD | Admitting: Adult Health

## 2018-01-09 VITALS — BP 120/77 | HR 80 | Temp 98.7°F | Ht 63.0 in | Wt 199.4 lb

## 2018-01-09 DIAGNOSIS — J40 Bronchitis, not specified as acute or chronic: Secondary | ICD-10-CM | POA: Diagnosis not present

## 2018-01-09 MED ORDER — BENZONATATE 200 MG PO CAPS: 200.0000 mg | ORAL_CAPSULE | Freq: Two times a day (BID) | ORAL | 0 refills | Status: DC | PRN

## 2018-01-09 MED ORDER — AZITHROMYCIN 250 MG PO TABS: ORAL_TABLET | ORAL | 0 refills | Status: DC

## 2018-01-09 NOTE — Progress Notes (Signed)
Subjective:    Patient ID: [name], female    DOB: Aug 28, 1955, 62 y.o.   MRN: [id number]  HPI:  Ms. Swinford presents with productive cough (clear mucus), nasal drainage (brown), sore throat (4/10), post nasal gtt, sinus HA (2/10), fever (highest 100 f), nausea with vomiting (two emesis last week), and loose stools daily- all sx's been occurring >1 week. She has been using OTC Mucinex with only minimal sx relief. She denies tobacco/ETOH use. She cares for her roommate whom is undergoing chemotherapy treatment. She denies being around anyone else acutely ill. Recent hx of LFTs, she denies RUQ pain  Patient Care Team    Relationship Specialty Notifications Start End  Thomasene Lot, DO PCP - General Family Medicine  03/11/16   Hilda Lias, MD Consulting Physician Neurosurgery  03/11/16   Rossie Muskrat, MD Consulting Physician Rheumatology  03/11/16   Claria Dice, MD Attending Physician Physical Medicine and Rehabilitation  10/04/16    Comment: INjections in back/ neck-- part of Tacey Heap, [facility] Consulting Physician Specialist  01/06/17    Comment: treating her for chronic neck pain- last seen this june/ july.   Center, [facility] Consulting Physician Dermatology  01/18/17     Patient Active Problem List   Diagnosis Date Noted  . Chronic diarrhea 06/22/2017  . Bilateral carpal tunnel syndrome 04/07/2017  . Bilateral hand pain 04/07/2017  . Trigger middle finger of right hand 04/07/2017  . Hx of total hysterectomy 01/18/2017  . Thoracic back pain 12/28/2016  . Chronic pain syndrome 10/04/2016  . h/o Elevated fasting blood sugar 10/04/2016  . chronic Elevated liver function tests   . Family history of diabetes mellitus 07/19/2016  . Gastroesophageal reflux disease 03/14/2016  . Environmental and seasonal allergies 03/14/2016  . Iron (Fe) deficiency anemia 03/14/2016  . Obesity 03/11/2016  . Mixed hyperlipidemia 03/11/2016  . Encounter for long-term  (current) use of high-risk medication 11/08/2015  . Major depressive disorder, single episode, severe without psychotic features (HCC)   . Rheumatoid arthritis (HCC) 06/22/2014  . 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 Fibromyalgia 02/19/2014  . Hypokalemia 09/18/2013  . Family history of ischemic heart disease   . Episode of dizziness after NTG while walking to BR 09/17/2013  . Arthritis of carpometacarpal (CMC) joints of both thumbs 09/12/2013  . Generalized anxiety disorder 08/23/2012  . Insomnia 08/14/2012  . Essential hypertension 06/16/2012  . Migraine, unspecified, without mention of intractable migraine without mention of status migrainosus 06/16/2012     Past Medical History:  Diagnosis Date  . Allergy   . Anxiety    takes Citalopram daily  . Arthritis   . Chronic back pain    radiculopathy  . Elevated liver function tests   . Fibromyalgia   . GERD (gastroesophageal reflux disease)    takes Omeprazole daily  . History of bronchitis    last yr  . History of shingles   . Hyperlipidemia    takes Atorvastatin daily  . Hypertension    takes HCTZ daily as well as Propranolol  . Insomnia    takes trazodone nightly  . Joint pain   . Joint swelling   . Migraine    hx of  . Muscle spasm of back    takes Flexeril daily as needed  . Obesity   . Pneumonia    hx of-many,many yrs ago  . PONV (postoperative nausea and vomiting)    violent nausea/vomiting  . Rheumatoid arthritis (HCC)   .  Seasonal allergies    takes Claritin daily  . Urinary urgency      Past Surgical History:  Procedure Laterality Date  . ABDOMINAL HYSTERECTOMY  2003  . ABDOMINOPLASTY  07/2015  . BACK SURGERY    . CARPAL TUNNEL RELEASE Bilateral   . CHOLECYSTECTOMY N/A 06/27/2012   Procedure: LAPAROSCOPIC CHOLECYSTECTOMY WITH INTRAOPERATIVE CHOLANGIOGRAM;  Surgeon: Currie Paris, MD;  Location: [facility];  Service: General;  Laterality: N/A;  . COLONOSCOPY    . LUMBAR  LAMINECTOMY/DECOMPRESSION MICRODISCECTOMY Right 04/23/2014   Procedure: Right L5-S1 Laminectomy/Foraminotomy;  Surgeon: Karn Cassis, MD;  Location: [facility];  Service: Neurosurgery;  Laterality: Right;  Right L5-S1 Laminectomy/Foraminotomy  . LUMBAR WOUND DEBRIDEMENT N/A 05/01/2014   Procedure: EXPLORATION OF LUMBAR WOUND;  Surgeon: Karn Cassis, MD;  Location: [facility];  Service: Neurosurgery;  Laterality: N/A;  EXPLORATION OF LUMBAR WOUND  . LUMBAR WOUND DEBRIDEMENT N/A 05/08/2014   Procedure: LUMBAR WOUND Exploration;  Surgeon: Karn Cassis, MD;  Location: [facility];  Service: Neurosurgery;  Laterality: N/A;  . NECK SURGERY       Family History  Problem Relation Age of Onset  . Leukemia Father   . Cancer Father        leukemia  . Colon polyps Father   . Diabetes Maternal Grandmother   . Rheum arthritis Maternal Grandmother   . Heart disease Paternal Grandmother   . Rheum arthritis Mother   . Heart disease Mother        2 coronary stents  . Heart disease Maternal Uncle   . Miscarriages / Stillbirths Maternal Uncle   . Heart disease Maternal Uncle   . Colon cancer Paternal Aunt 69  . Heart disease Maternal Aunt   . Heart disease Maternal Aunt   . Esophageal cancer Neg Hx   . Rectal cancer Neg Hx   . Stomach cancer Neg Hx      Social History   Substance and Sexual Activity  Drug Use No     Social History   Substance and Sexual Activity  Alcohol Use No  . Alcohol/week: 0.0 standard drinks     Social History   Tobacco Use  Smoking Status Former Smoker  . Packs/day: 0.50  . Years: 10.00  . Pack years: 5.00  . Types: Cigarettes  . Last attempt to quit: 05/03/1996  . Years since quitting: 21.7  Smokeless Tobacco Never Used  Tobacco Comment   quit smoking 89yrs ago     Outpatient Encounter Medications as of 01/09/2018  Medication Sig  . cyclobenzaprine (FLEXERIL) 10 MG tablet Take 1 tablet (10 mg total) by mouth 3 (three) times daily as  needed for muscle spasms.  . DULoxetine (CYMBALTA) 30 MG capsule TAKE 1 CAPSULE BY MOUTH 2 TIMES DAILY FOR DEPRESSION  . ferrous sulfate 325 (65 FE) MG tablet Take 1 tablet (325 mg total) by mouth daily with breakfast.  . hydrochlorothiazide (HYDRODIURIL) 25 MG tablet TAKE 1 TABLET BY MOUTH EVERY DAY FOR HIGH BLOOD PRESSURE  . hydroxychloroquine (PLAQUENIL) 200 MG tablet Take 2 tablets (400 mg total) by mouth daily.  . Ibuprofen-Famotidine (DUEXIS) 800-26.6 MG TABS Take 2 tablets by mouth daily.  Marland Kitchen loratadine (CLARITIN) 10 MG tablet TAKE 1 TABLET BY MOUTH EVERY DAY  . losartan (COZAAR) 50 MG tablet TAKE 1 TABLET BY MOUTH EVERY DAY  . metroNIDAZOLE (FLAGYL) 500 MG tablet Take 1 tablet (500 mg total) by mouth 3 (three) times daily.  Marland Kitchen omeprazole (PRILOSEC) 20  MG capsule TAKE 1 CAPSULE BY MOUTH EVERY DAY  . traZODone (DESYREL) 100 MG tablet TAKE 2 TABLETS AT BEDTIME AS NEEDED FOR SLEEP  . leflunomide (ARAVA) 20 MG tablet Take 20 mg by mouth daily.  . pregabalin (LYRICA) 75 MG capsule Take 1 capsule (75 mg total) by mouth 2 (two) times daily. (Patient not taking: Reported on 01/09/2018)   Facility-Administered Encounter Medications as of 01/09/2018  Medication  . 0.9 %  sodium chloride infusion    Allergies: Hydrocodone  Body mass index is 35.32 kg/m.  Blood pressure 120/77, pulse 80, temperature 98.7 F (37.1 C), temperature source Oral, height 5\' 3"  (1.6 m), weight 199 lb 6.4 oz (90.4 kg), SpO2 96 %.   Review of Systems  Constitutional: Positive for activity change, appetite change, chills, fatigue and fever. Negative for diaphoresis and unexpected weight change.  HENT: Positive for congestion, postnasal drip, rhinorrhea, sinus pressure, sinus pain, sneezing, sore throat and voice change. Negative for ear discharge, ear pain and trouble swallowing.   Eyes: Negative for visual disturbance.  Respiratory: Positive for cough, shortness of breath and wheezing. Negative for chest tightness and  stridor.   Cardiovascular: Negative for chest pain, palpitations and leg swelling.  Gastrointestinal: Positive for diarrhea, nausea and vomiting. Negative for abdominal distention, abdominal pain and constipation.  Endocrine: Negative for cold intolerance, heat intolerance, polydipsia, polyphagia and polyuria.  Genitourinary: Negative for difficulty urinating and flank pain.  Neurological: Positive for headaches. Negative for dizziness.  Hematological: Does not bruise/bleed easily.  Psychiatric/Behavioral: Positive for sleep disturbance.       Objective:   Physical Exam  Constitutional: She is oriented to person, place, and time. She appears well-developed and well-nourished. No distress.  HENT:  Head: Normocephalic and atraumatic.  Right Ear: External ear normal. Tympanic membrane is bulging. Tympanic membrane is not erythematous. No decreased hearing is noted.  Left Ear: External ear normal. Tympanic membrane is bulging. Tympanic membrane is not erythematous. No decreased hearing is noted.  Nose: Mucosal edema and rhinorrhea present. Right sinus exhibits maxillary sinus tenderness. Right sinus exhibits no frontal sinus tenderness. Left sinus exhibits maxillary sinus tenderness. Left sinus exhibits no frontal sinus tenderness.  Mouth/Throat: Uvula is midline and mucous membranes are normal. Posterior oropharyngeal edema and posterior oropharyngeal erythema present. No oropharyngeal exudate or tonsillar abscesses.  Eyes: Pupils are equal, round, and reactive to light. Conjunctivae and EOM are normal.  Cardiovascular: Normal rate, regular rhythm, normal heart sounds and intact distal pulses.  No murmur heard. Pulmonary/Chest: Effort normal and breath sounds normal. No stridor. No respiratory distress. She has no wheezes. She has no rales. She exhibits no tenderness.  Abdominal: Soft. Bowel sounds are normal. She exhibits no distension and no mass. There is no tenderness. There is no rebound  and no guarding. No hernia.  Neurological: She is alert and oriented to person, place, and time.  Skin: Skin is dry. Capillary refill takes less than 2 seconds. She is not diaphoretic.  Psychiatric: She has a normal mood and affect. Her behavior is normal. Judgment and thought content normal.  Nursing note and vitals reviewed.     Assessment & Plan:   1. Bronchitis     Bronchitis Please take Azithromycin as directed. If you develop right upper quadrant pain while taking, please stop medication and call clinic. Please use Tessalon Pearls as directed at bedtime. Recommend OTC Robutussin or Delsym for daytime cough. Increase fluids/rest/vit c-2,000mg /day when not feeling well. If symptoms persist after antibiotic completed, then please  call clinic.    FOLLOW-UP:  Return if symptoms worsen or fail to improve.

## 2018-01-09 NOTE — Patient Instructions (Signed)
Cough, Adult Coughing is a reflex that clears your throat and your airways. Coughing helps to heal and protect your lungs. It is normal to cough occasionally, but a cough that happens with other symptoms or lasts a long time may be a sign of a condition that needs treatment. A cough may last only 2-3 weeks (acute), or it may last longer than 8 weeks (chronic). What are the causes? Coughing is commonly caused by:  Breathing in substances that irritate your lungs.  A viral or bacterial respiratory infection.  Allergies.  Asthma.  Postnasal drip.  Smoking.  Acid backing up from the stomach into the esophagus (gastroesophageal reflux).  Certain medicines.  Chronic lung problems, including COPD (or rarely, lung cancer).  Other medical conditions such as heart failure.  Follow these instructions at home: Pay attention to any changes in your symptoms. Take these actions to help with your discomfort:  Take medicines only as told by your health care provider. ? If you were prescribed an antibiotic medicine, take it as told by your health care provider. Do not stop taking the antibiotic even if you start to feel better. ? Talk with your health care provider before you take a cough suppressant medicine.  Drink enough fluid to keep your urine clear or pale yellow.  If the air is dry, use a cold steam vaporizer or humidifier in your bedroom or your home to help loosen secretions.  Avoid anything that causes you to cough at work or at home.  If your cough is worse at night, try sleeping in a semi-upright position.  Avoid cigarette smoke. If you smoke, quit smoking. If you need help quitting, ask your health care provider.  Avoid caffeine.  Avoid alcohol.  Rest as needed.  Contact a health care provider if:  You have new symptoms.  You cough up pus.  Your cough does not get better after 2-3 weeks, or your cough gets worse.  You cannot control your cough with suppressant  medicines and you are losing sleep.  You develop pain that is getting worse or pain that is not controlled with pain medicines.  You have a fever.  You have unexplained weight loss.  You have night sweats. Get help right away if:  You cough up blood.  You have difficulty breathing.  Your heartbeat is very fast. This information is not intended to replace advice given to you by your health care provider. Make sure you discuss any questions you have with your health care provider. Document Released: 10/16/2010 Document Revised: 09/25/2015 Document Reviewed: 06/26/2014 Elsevier Interactive Patient Education  [employer].   Please take Azithromycin as directed. If you develop right upper quadrant pain while taking, please stop medication and call clinic. Please use Tessalon Pearls as directed at bedtime. Recommend OTC Robutussin or Delsym for daytime cough. Increase fluids/rest/vit c-2,000mg /day when not feeling well. If symptoms persist after antibiotic completed, then please call clinic. FEEL BETTER!

## 2018-01-09 NOTE — Assessment & Plan Note (Signed)
Please take Azithromycin as directed. If you develop right upper quadrant pain while taking, please stop medication and call clinic. Please use Tessalon Pearls as directed at bedtime. Recommend OTC Robutussin or Delsym for daytime cough. Increase fluids/rest/vit c-2,000mg /day when not feeling well. If symptoms persist after antibiotic completed, then please call clinic.

## 2018-01-30 LAB — CBC AND DIFFERENTIAL
HCT: 41 (ref 36–46)
HEMOGLOBIN: 13.2 (ref 12.0–16.0)
Platelets: 328 (ref 150–399)
WBC: 9.6

## 2018-01-30 LAB — BASIC METABOLIC PANEL
BUN: 19 (ref 4–21)
Creatinine: 0.7 (ref 0.5–1.1)
Glucose: 97
POTASSIUM: 3.4 (ref 3.4–5.3)
SODIUM: 140 (ref 137–147)

## 2018-01-30 LAB — HEPATIC FUNCTION PANEL
ALT: 160 — AB (ref 7–35)
AST: 64 — AB (ref 13–35)
Alkaline Phosphatase: 113 (ref 25–125)
Bilirubin, Total: 0.3

## 2018-02-07 ENCOUNTER — Encounter: Payer: Self-pay | Admitting: Family Medicine

## 2018-02-07 ENCOUNTER — Ambulatory Visit (INDEPENDENT_AMBULATORY_CARE_PROVIDER_SITE_OTHER): Payer: BLUE CROSS/BLUE SHIELD | Admitting: Family Medicine

## 2018-02-07 VITALS — BP 111/79 | HR 67 | Ht 63.0 in | Wt 203.0 lb

## 2018-02-07 DIAGNOSIS — I10 Essential (primary) hypertension: Secondary | ICD-10-CM

## 2018-02-07 DIAGNOSIS — G4700 Insomnia, unspecified: Secondary | ICD-10-CM | POA: Diagnosis not present

## 2018-02-07 DIAGNOSIS — F322 Major depressive disorder, single episode, severe without psychotic features: Secondary | ICD-10-CM

## 2018-02-07 DIAGNOSIS — Z23 Encounter for immunization: Secondary | ICD-10-CM

## 2018-02-07 DIAGNOSIS — G894 Chronic pain syndrome: Secondary | ICD-10-CM

## 2018-02-07 MED ORDER — HYDROCHLOROTHIAZIDE 25 MG PO TABS: 25.0000 mg | ORAL_TABLET | Freq: Every day | ORAL | 1 refills | Status: DC

## 2018-02-07 MED ORDER — TRAZODONE HCL 100 MG PO TABS: ORAL_TABLET | ORAL | 0 refills | Status: DC

## 2018-02-07 MED ORDER — LOSARTAN POTASSIUM 50 MG PO TABS: 50.0000 mg | ORAL_TABLET | Freq: Every day | ORAL | 1 refills | Status: DC

## 2018-02-07 NOTE — Progress Notes (Signed)
Impression and Recommendations:    1. Essential hypertension   2. Major depressive disorder, single episode, severe without psychotic features ([name])   3. Chronic pain syndrome- FibroMyalgia   4. Insomnia, unspecified type   5. Essential hypertension     1. Essential Hypertension - Blood pressure remains controlled at this time. - Continue treatment plan as recommended.  See med list below. - Patient tolerating meds well without complication.  Denies S-E  - Prudent diet and exercise habits reviewed and encouraged today. - Ambulatory BP monitoring encouraged. Keep log and bring in next OV  2. Major Depressive Disorder - Symptoms well managed on Cymbalta at this time, but this was increased by her pain doctor. - Continue treatment as prescribed by her .  See med list below. - Patient tolerating meds well without complication.  Denies S-E  3. Insomnia/Trazodone Use - Patient knows to make sure that she is taking trazodone as advised. - Advised patient to take only up to 100 mg QHS as needed. - Reviewed prudent practice for taking trazodone if needed during the day.  - Educated patient at length about prudent trazodone use.  4. Pain Clinic Follow-Up - Chronic pain syndrome- FibroMyalgia  - Encouraged patient to continue to follow up with Dr. Maryruth Cross as recommended. - Pain clinic will continue to manage Cymbalta and other associated prescriptions.  5. Rheumatology Follow-Up - Reviewed results of patient's last abdominal ultrasound (07/01/2017) in office today:    IMPRESSION: 1. No evidence of acute abnormality. 2. Hepatic steatosis. 3. Status post cholecystectomy.  No biliary dilatation.  - Encouraged patient to continue to follow up with rheumatology as recommended. - Advised patient to discuss pain management with rheumatology and the pain clinic.  6. BMI Counseling Explained to patient what BMI refers to, and what it means medically.    Told patient to think about it  as a "medical risk stratification measurement" and how increasing BMI is associated with increasing risk/ or worsening state of various diseases such as hypertension, hyperlipidemia, diabetes, premature OA, depression etc.  American Heart Association guidelines for healthy diet, basically Mediterranean diet, and exercise guidelines of 30 minutes 5 days per week or more discussed in detail.  Health counseling performed.  All questions answered.  7. Lifestyle & Preventative Health Maintenance - Advised patient to continue working toward exercising to improve overall mental, physical, and emotional health.    - Encouraged patient to engage in daily physical activity, especially a formal exercise routine.  Recommended that the patient eventually strive for at least 150 minutes of moderate cardiovascular activity per week according to guidelines established by the [facility].   - Healthy dietary habits encouraged, including low-carb, and high amounts of lean protein in diet.   - Patient should also consume adequate amounts of water - half of body weight in oz of water per day.   Education and routine counseling performed. Handouts provided.  8. Follow-Up - Prescriptions refilled today as needed. - Re-check fasting lab work as recommended. - Patient knows to continue to follow up with specialists as recommended. - Otherwise, continue to return for CPE and chronic follow-up as scheduled.   - Patient knows to call in if desired to address acute concerns or other health goals.    No orders of the defined types were placed in this encounter.   Meds ordered this encounter  Medications  . traZODone (DESYREL) 100 MG tablet    Sig: 1 tab po qhs prn  Dispense:  180 tablet    Refill:  0  . losartan (COZAAR) 50 MG tablet    Sig: Take 1 tablet (50 mg total) by mouth daily.    Dispense:  90 tablet    Refill:  1  . hydrochlorothiazide (HYDRODIURIL) 25 MG tablet    Sig: Take 1 tablet (25 mg total) by  mouth daily. for high blood pressure    Dispense:  90 tablet    Refill:  1    Medications Discontinued During This Encounter  Medication Reason  . azithromycin (ZITHROMAX) 250 MG tablet Completed Course  . benzonatate (TESSALON) 200 MG capsule Completed Course  . metroNIDAZOLE (FLAGYL) 500 MG tablet No longer needed (for PRN medications)  . pregabalin (LYRICA) 75 MG capsule Discontinued by provider  . ferrous sulfate 325 (65 FE) MG tablet Discontinued by provider  . hydroxychloroquine (PLAQUENIL) 200 MG tablet Discontinued by provider  . leflunomide (ARAVA) 20 MG tablet Discontinued by provider  . traZODone (DESYREL) 100 MG tablet   . losartan (COZAAR) 50 MG tablet Reorder  . hydrochlorothiazide (HYDRODIURIL) 25 MG tablet Reorder     The patient was counseled, risk factors were discussed, anticipatory guidance given.  Gross side effects, risk and benefits, and alternatives of medications discussed with patient.  Patient is aware that all medications have potential side effects and we are unable to predict every side effect or drug-drug interaction that may occur.  Expresses verbal understanding and consents to current therapy plan and treatment regimen.  Return for CPE with fasting blood work near future,o/w f/up every 4 months for hypertension, and multiple med.  Please see AVS handed out to patient at the end of our visit for further patient instructions/ counseling done pertaining to today's office visit.    Note:  This document was prepared using Dragon voice recognition software and may include unintentional dictation errors.  This document serves as a record of services personally performed by Mellody Dance, DO. It was created on her behalf by Toni Amend, a trained medical scribe. The creation of this record is based on the scribe's personal observations and the provider's statements to them.   I have reviewed the above medical documentation for accuracy and  completeness and I concur.  Mellody Dance 02/07/18 3:05 PM    Subjective:    HPI: [name] is a 62 y.o. female who presents to [place] at [facility] today for follow up of [name].   States that she's been doing well lately.   Patient had a party the other night at her boyfriend's house. Notes that they threw a bean bag game and she ended up pulling muscles. Her pain was very bad after this.  She continues to follow up at the pain clinic.  Pain Clinic - Fibromyalgia Patient has had good experiences with Dr. Maryruth Cross. She received cortisone shots for assistance with her pain.  Mood Patient is on Cymbalta for depression and fibromyalgia. Pain clinic has been filling her Cymbalta prescription. She takes Cymbalta twice a day and notes that it has helped.  Insomnia Patient takes two tablets of trazodone per night. Notes that she is unsure how many mg of trazodone she takes, but knows that she takes two tablets per night.  Fibromyalgia/Rheumatology After experiencing elevated liver enzymes, she's been taken off of everything for RA, aside from Enbrel.  Notes that some medicines were causing her to throw up.  Taken off of Arava and Plaquenil per Rheumatology, Dr. Dossie Der. Since she  was taken off of these meds, her diarrhea and vomiting has stopped.  States that her doctor was concerned about her kidneys and her liver. Had her last liver ultrasound on 07/01/2017; liver was WNL.  Patient believes that her next appointment with Rheum is in December.  Exercise Habits Patient goes to exercise on the treadmill about 30 minutes, 3 times per week.  HTN:  -  Her blood pressure has been controlled at home.  Pt has been checking it regularly.  States it's been good, including everywhere she's had her blood pressure taken.  - Patient reports good compliance with blood pressure medications  - Denies medication S-E   - Smoking Status noted   - She denies  new onset of: chest pain, exercise intolerance, shortness of breath, dizziness, visual changes, headache, lower extremity swelling or claudication.   Last 3 blood pressure readings in our office are as follows: BP Readings from Last 3 Encounters:  02/07/18 111/79  01/09/18 120/77  08/12/17 117/78    Pulse Readings from Last 3 Encounters:  02/07/18 67  01/09/18 80  08/12/17 76    Filed Weights   02/07/18 1401  Weight: 203 lb (92.1 kg)    1. 62 y.o. female here for cholesterol follow-up.   - Patient reports good compliance with medications or treatment plan  - Denies medication S-E   - Smoking Status noted   - She denies new onset of: chest pain, exercise intolerance, shortness of breath, dizziness, visual changes, headache, lower extremity swelling or claudication.   Patient with fibromyalgia.  The cholesterol last visit was:  Lab Results  Component Value Date   CHOL 233 (H) 01/18/2017   HDL 43 01/18/2017   LDLCALC 148 (H) 01/18/2017   TRIG 209 (H) 01/18/2017   CHOLHDL 5.4 (H) 01/18/2017    Hepatic Function Latest Ref Rng & Units 10/26/2017 06/22/2017 04/19/2017  Total Protein 6.0 - 8.5 g/dL - 6.9 -  Albumin 3.6 - 4.8 g/dL - 4.2 -  AST 13 - 35 51(A) 28 81(A)  ALT 7 - 35 100(A) 53(H) 151(A)  Alk Phosphatase 25 - 125 148(A) 126(H) 129(A)  Total Bilirubin 0.0 - 1.2 mg/dL - 0.3 -  Bilirubin, Direct 0.00 - 0.40 mg/dL - 0.14 -   Depression screen [facility] 2/9 02/07/2018 01/09/2018 08/08/2017 06/22/2017 12/28/2016  Decreased Interest '2 2 1 1 1  '$ Down, Depressed, Hopeless 2 0 1 0 1  PHQ - 2 Score '4 2 2 1 2  '$ Altered sleeping 2 0 '2 1 2  '$ Tired, decreased energy '3 2 2 1 2  '$ Change in appetite '1 1 1 1 2  '$ Feeling bad or failure about yourself  1 0 0 0 0  Trouble concentrating 0 0 '1 1 1  '$ Moving slowly or fidgety/restless 0 0 0 0 0  Suicidal thoughts 0 0 0 0 0  PHQ-9 Score '11 5 8 5 9  '$ Difficult doing work/chores Somewhat difficult Not difficult at all Somewhat difficult Not difficult at  all Somewhat difficult    GAD 7 : Generalized Anxiety Score 02/07/2018 10/04/2016  Nervous, Anxious, on Edge 1 0  Control/stop worrying 0 2  Worry too much - different things 1 2  Trouble relaxing 0 2  Restless 0 2  Easily annoyed or irritable 3 2  Afraid - awful might happen 0 0  Total GAD 7 Score 5 10  Anxiety Difficulty - Somewhat difficult    Patient Care Team    Relationship Specialty Notifications Start End  Mellody Dance, DO PCP - General Family Medicine  03/11/16   Leeroy Cha, MD Consulting Physician Neurosurgery  03/11/16   Valinda Party, MD Consulting Physician Rheumatology  03/11/16   Normajean Glasgow, MD Attending Physician Physical Medicine and Rehabilitation  10/04/16    Comment: INjections in back/ neck-- part of [place] Chamberlain, [place] Physician Specialist  01/06/17    Comment: treating her for chronic neck pain- last seen this june/ july.   [facility] [place] [facility] Consulting Physician Dermatology  01/18/17   Doran Stabler, MD Consulting Physician Gastroenterology  02/07/18   Lucia Bitter., MD Physician Assistant Pain Medicine  02/07/18      Lab Results  Component Value Date   CREATININE 0.8 10/26/2017   BUN 10 10/26/2017   NA 102 (A) 10/26/2017   NA 143 10/26/2017   K 2.9 (A) 10/26/2017   CL 98 06/22/2017   CO2 26 06/22/2017    Lab Results  Component Value Date   CHOL 233 (H) 01/18/2017   CHOL 164 02/05/2016   CHOL 134 08/20/2014    Lab Results  Component Value Date   HDL 43 01/18/2017   HDL 59 02/05/2016   HDL 51 08/20/2014    Lab Results  Component Value Date   LDLCALC 148 (H) 01/18/2017   LDLCALC 78 02/05/2016   LDLCALC 63 08/20/2014    Lab Results  Component Value Date   TRIG 209 (H) 01/18/2017   TRIG 137 02/05/2016   TRIG 102 08/20/2014    Lab Results  Component Value Date   CHOLHDL 5.4 (H) 01/18/2017   CHOLHDL 2.8 02/05/2016   CHOLHDL 2.6 08/20/2014    No results found for:  LDLDIRECT ===================================================================   Patient Active Problem List   Diagnosis Date Noted  . Obesity 03/11/2016    Priority: High  . Mixed hyperlipidemia 03/11/2016    Priority: High  . Essential hypertension 06/16/2012    Priority: High  . Chronic pain syndrome 10/04/2016    Priority: Medium  . Gastroesophageal reflux disease 03/14/2016    Priority: Medium  . Iron (Fe) deficiency anemia 03/14/2016    Priority: Medium  . Major depressive disorder, single episode, severe without psychotic features ([place])     Priority: Medium  . Fibromyalgia 02/19/2014    Priority: Medium  . Arthritis of carpometacarpal (CMC) joints of both thumbs 09/12/2013    Priority: Medium  . Migraine, unspecified, without mention of intractable migraine without mention of status migrainosus 06/16/2012    Priority: Medium  . h/o Elevated fasting blood sugar 10/04/2016    Priority: Low  . chronic Elevated liver function tests     Priority: Low  . Family history of diabetes mellitus 07/19/2016    Priority: Low  . Environmental and seasonal allergies 03/14/2016    Priority: Low  . Generalized anxiety disorder 08/23/2012    Priority: Low  . Insomnia 08/14/2012    Priority: Low  . Bronchitis 01/09/2018  . Chronic diarrhea 06/22/2017  . Bilateral carpal tunnel syndrome 04/07/2017  . Bilateral hand pain 04/07/2017  . Trigger middle finger of right hand 04/07/2017  . Hx of total hysterectomy 01/18/2017  . Thoracic back pain 12/28/2016  . Encounter for long-term (current) use of high-risk medication 11/08/2015  . Rheumatoid arthritis ([place]) 06/22/2014  . 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 Hypokalemia 09/18/2013  . Family history of ischemic heart disease   . Episode of dizziness after NTG while walking to [facility] 09/17/2013  Past Medical History:  Diagnosis Date  . Allergy   . Anxiety    takes Citalopram daily  . Arthritis   . Chronic back pain     radiculopathy  . Elevated liver function tests   . Fibromyalgia   . GERD (gastroesophageal reflux disease)    takes Omeprazole daily  . History of bronchitis    last yr  . History of shingles   . Hyperlipidemia    takes Atorvastatin daily  . Hypertension    takes HCTZ daily as well as Propranolol  . Insomnia    takes trazodone nightly  . Joint pain   . Joint swelling   . Migraine    hx of  . Muscle spasm of back    takes Flexeril daily as needed  . Obesity   . Pneumonia    hx of-many,many yrs ago  . PONV (postoperative nausea and vomiting)    violent nausea/vomiting  . Rheumatoid arthritis ([place])   . Seasonal allergies    takes Claritin daily  . Urinary urgency      Past Surgical History:  Procedure Laterality Date  . ABDOMINAL HYSTERECTOMY  2003  . ABDOMINOPLASTY  07/2015  . BACK SURGERY    . CARPAL TUNNEL RELEASE Bilateral   . CHOLECYSTECTOMY N/A 06/27/2012   Procedure: LAPAROSCOPIC CHOLECYSTECTOMY WITH INTRAOPERATIVE CHOLANGIOGRAM;  Surgeon: Haywood Lasso, MD;  Location: [facility];  Service: General;  Laterality: N/A;  . COLONOSCOPY    . LUMBAR LAMINECTOMY/DECOMPRESSION MICRODISCECTOMY Right 04/23/2014   Procedure: Right L5-S1 Laminectomy/Foraminotomy;  Surgeon: Floyce Stakes, MD;  Location: [facility];  Service: Neurosurgery;  Laterality: Right;  Right L5-S1 Laminectomy/Foraminotomy  . LUMBAR WOUND DEBRIDEMENT N/A 05/01/2014   Procedure: EXPLORATION OF LUMBAR WOUND;  Surgeon: Floyce Stakes, MD;  Location: [facility];  Service: Neurosurgery;  Laterality: N/A;  EXPLORATION OF LUMBAR WOUND  . LUMBAR WOUND DEBRIDEMENT N/A 05/08/2014   Procedure: LUMBAR WOUND Exploration;  Surgeon: Floyce Stakes, MD;  Location: [facility];  Service: Neurosurgery;  Laterality: N/A;  . NECK SURGERY       Family History  Problem Relation Age of Onset  . Leukemia Father   . Cancer Father        leukemia  . Colon polyps Father   . Diabetes Maternal Grandmother   .  Rheum arthritis Maternal Grandmother   . Heart disease Paternal Grandmother   . Rheum arthritis Mother   . Heart disease Mother        2 coronary stents  . Heart disease Maternal Uncle   . Miscarriages / Stillbirths Maternal Uncle   . Heart disease Maternal Uncle   . Colon cancer Paternal Aunt 36  . Heart disease Maternal Aunt   . Heart disease Maternal Aunt   . Esophageal cancer Neg Hx   . Rectal cancer Neg Hx   . Stomach cancer Neg Hx      Social History   Substance and Sexual Activity  Drug Use No  ,  Social History   Substance and Sexual Activity  Alcohol Use No  . Alcohol/week: 0.0 standard drinks  ,  Social History   Tobacco Use  Smoking Status Former Smoker  . Packs/day: 0.50  . Years: 10.00  . Pack years: 5.00  . Types: Cigarettes  . Last attempt to quit: 05/03/1996  . Years since quitting: 21.7  Smokeless Tobacco Never Used  Tobacco Comment   quit smoking 54yr ago  ,    Current Outpatient  Medications on File Prior to Visit  Medication Sig Dispense Refill  . cyclobenzaprine (FLEXERIL) 10 MG tablet Take 1 tablet (10 mg total) by mouth 3 (three) times daily as needed for muscle spasms. 30 tablet 0  . DULoxetine (CYMBALTA) 30 MG capsule TAKE 1 CAPSULE BY MOUTH 2 TIMES DAILY FOR DEPRESSION 180 capsule 0  . Ibuprofen-Famotidine (DUEXIS) 800-26.6 MG TABS Take 2 tablets by mouth daily. 90 tablet   . loratadine (CLARITIN) 10 MG tablet TAKE 1 TABLET BY MOUTH EVERY DAY 90 tablet 1  . omeprazole (PRILOSEC) 20 MG capsule TAKE 1 CAPSULE BY MOUTH EVERY DAY 90 capsule 1   Current Facility-Administered Medications on File Prior to Visit  Medication Dose Route Frequency Provider Last Rate Last Dose  . 0.9 %  sodium chloride infusion  500 mL Intravenous Once Doran Stabler, MD         Allergies  Allergen Reactions  . Hydrocodone Itching     Review of Systems:   General:  Denies fever, chills Optho/Auditory:   Denies visual changes, blurred  vision Respiratory:   Denies SOB, cough, wheeze, DIB  Cardiovascular:   Denies chest pain, palpitations, painful respirations Gastrointestinal:   Denies nausea, vomiting, diarrhea.  Endocrine:     Denies new hot or cold intolerance Musculoskeletal:  Denies joint swelling, gait issues, or new unexplained myalgias/ arthralgias Skin:  Denies rash, suspicious lesions  Neurological:    Denies dizziness, unexplained weakness, numbness  Psychiatric/Behavioral:   Denies mood changes  Objective:    Blood pressure 111/79, pulse 67, height '5\' 3"'$  (1.6 m), weight 203 lb (92.1 kg), SpO2 97 %.  Body mass index is 35.96 kg/m.  General: Well Developed, well nourished, and in no acute distress.  HEENT: Normocephalic, atraumatic, pupils equal round reactive to light, neck supple, No carotid bruits, no JVD Skin: Warm and dry, cap RF less 2 sec Cardiac: Regular rate and rhythm, S1, S2 WNL's, no murmurs rubs or gallops Respiratory: ECTA B/L, Not using accessory muscles, speaking in full sentences. NeuroM-Sk: Ambulates w/o assistance, moves ext * 4 w/o difficulty, sensation grossly intact.  Ext: scant edema b/l lower ext Psych: No HI/SI, judgement and insight good, Euthymic mood. Full Affect.

## 2018-02-07 NOTE — Addendum Note (Signed)
Addended by: Leda Min D on: 02/07/2018 04:50 PM   Modules accepted: Orders

## 2018-02-09 ENCOUNTER — Emergency Department (HOSPITAL_COMMUNITY): Payer: BLUE CROSS/BLUE SHIELD

## 2018-02-09 ENCOUNTER — Encounter (HOSPITAL_COMMUNITY): Payer: Self-pay | Admitting: Emergency Medicine

## 2018-02-09 ENCOUNTER — Emergency Department (HOSPITAL_COMMUNITY)
Admission: EM | Admit: 2018-02-09 | Discharge: 2018-02-09 | Disposition: A | Discharge status: Home / Self Care | Payer: BLUE CROSS/BLUE SHIELD | Attending: Emergency Medicine | Admitting: Emergency Medicine

## 2018-02-09 DIAGNOSIS — R011 Cardiac murmur, unspecified: Secondary | ICD-10-CM | POA: Diagnosis not present

## 2018-02-09 DIAGNOSIS — M546 Pain in thoracic spine: Secondary | ICD-10-CM | POA: Insufficient documentation

## 2018-02-09 DIAGNOSIS — R1011 Right upper quadrant pain: Secondary | ICD-10-CM | POA: Insufficient documentation

## 2018-02-09 DIAGNOSIS — R0789 Other chest pain: Secondary | ICD-10-CM | POA: Diagnosis not present

## 2018-02-09 DIAGNOSIS — Z79899 Other long term (current) drug therapy: Secondary | ICD-10-CM | POA: Diagnosis not present

## 2018-02-09 DIAGNOSIS — Z87891 Personal history of nicotine dependence: Secondary | ICD-10-CM | POA: Insufficient documentation

## 2018-02-09 DIAGNOSIS — I10 Essential (primary) hypertension: Secondary | ICD-10-CM | POA: Insufficient documentation

## 2018-02-09 LAB — I-STAT TROPONIN, ED
TROPONIN I, POC: 0 ng/mL (ref 0.00–0.08)
Troponin i, poc: 0 ng/mL (ref 0.00–0.08)

## 2018-02-09 LAB — CBC WITH DIFFERENTIAL/PLATELET
Abs Immature Granulocytes: 0.03 10*3/uL (ref 0.00–0.07)
Basophils Absolute: 0 10*3/uL (ref 0.0–0.1)
Basophils Relative: 1 %
EOS PCT: 2 %
Eosinophils Absolute: 0.1 10*3/uL (ref 0.0–0.5)
HEMATOCRIT: 43.1 % (ref 36.0–46.0)
HEMOGLOBIN: 14 g/dL (ref 12.0–15.0)
Immature Granulocytes: 0 %
LYMPHS ABS: 1.1 10*3/uL (ref 0.7–4.0)
LYMPHS PCT: 16 %
MCH: 29.4 pg (ref 26.0–34.0)
MCHC: 32.5 g/dL (ref 30.0–36.0)
MCV: 90.4 fL (ref 80.0–100.0)
MONO ABS: 0.4 10*3/uL (ref 0.1–1.0)
MONOS PCT: 6 %
Neutro Abs: 5.5 10*3/uL (ref 1.7–7.7)
Neutrophils Relative %: 75 %
Platelets: 310 10*3/uL (ref 150–400)
RBC: 4.77 MIL/uL (ref 3.87–5.11)
RDW: 13.8 % (ref 11.5–15.5)
WBC: 7.2 10*3/uL (ref 4.0–10.5)
nRBC: 0 % (ref 0.0–0.2)

## 2018-02-09 LAB — COMPREHENSIVE METABOLIC PANEL
ALT: 43 U/L (ref 0–44)
AST: 20 U/L (ref 15–41)
Albumin: 4.1 g/dL (ref 3.5–5.0)
Alkaline Phosphatase: 76 U/L (ref 38–126)
Anion gap: 11 (ref 5–15)
BILIRUBIN TOTAL: 0.5 mg/dL (ref 0.3–1.2)
BUN: 12 mg/dL (ref 8–23)
CO2: 30 mmol/L (ref 22–32)
CREATININE: 0.71 mg/dL (ref 0.44–1.00)
Calcium: 9.4 mg/dL (ref 8.9–10.3)
Chloride: 100 mmol/L (ref 98–111)
GFR calc Af Amer: >60 mL/min (ref >60)
Glucose, Bld: 116 mg/dL — ABNORMAL HIGH (ref 70–99)
POTASSIUM: 3.3 mmol/L — AB (ref 3.5–5.1)
Sodium: 141 mmol/L (ref 135–145)
TOTAL PROTEIN: 6.9 g/dL (ref 6.5–8.1)

## 2018-02-09 LAB — PROTIME-INR
INR: 0.81
PROTHROMBIN TIME: 11.1 s — AB (ref 11.4–15.2)

## 2018-02-09 LAB — LIPASE, BLOOD: Lipase: 23 U/L (ref 11–51)

## 2018-02-09 LAB — URINALYSIS, ROUTINE W REFLEX MICROSCOPIC
Bilirubin Urine: NEGATIVE
GLUCOSE, UA: NEGATIVE mg/dL
HGB URINE DIPSTICK: NEGATIVE
Ketones, ur: NEGATIVE mg/dL
Leukocytes, UA: NEGATIVE
Nitrite: NEGATIVE
Protein, ur: NEGATIVE mg/dL
SPECIFIC GRAVITY, URINE: 1.008 (ref 1.005–1.030)
pH: 7 (ref 5.0–8.0)

## 2018-02-09 LAB — D-DIMER, QUANTITATIVE (NOT AT ARMC): D DIMER QUANT: 0.4 ug{FEU}/mL (ref 0.00–0.50)

## 2018-02-09 LAB — AMMONIA: Ammonia: 22 umol/L (ref 9–35)

## 2018-02-09 LAB — I-STAT CG4 LACTIC ACID, ED: Lactic Acid, Venous: 1.35 mmol/L (ref 0.5–1.9)

## 2018-02-09 MED ORDER — LIDOCAINE 5 % EX PTCH: 1.0000 | MEDICATED_PATCH | CUTANEOUS | 0 refills | Status: DC

## 2018-02-09 MED ORDER — OXYCODONE HCL 5 MG PO TABS
5.0000 mg | ORAL_TABLET | Freq: Once | ORAL | Status: AC
  Administered 2018-02-09: 5 mg via ORAL
  Filled 2018-02-09: qty 1

## 2018-02-09 MED ORDER — OXYCODONE HCL 5 MG PO TABS: 5.0000 mg | ORAL_TABLET | ORAL | 0 refills | Status: DC | PRN

## 2018-02-09 MED ORDER — LIDOCAINE 5 % EX PTCH
1.0000 | MEDICATED_PATCH | CUTANEOUS | Status: DC
  Administered 2018-02-09: 1 via TRANSDERMAL
  Filled 2018-02-09: qty 1

## 2018-02-09 NOTE — ED Notes (Signed)
Bed: EV03 Expected date: 02/09/18 Expected time: 8:18 AM Means of arrival: Ambulance Comments:

## 2018-02-09 NOTE — ED Notes (Signed)
Pt stated that her pain is not any better and that we "need to fix it." MD notified.

## 2018-02-09 NOTE — ED Notes (Signed)
Patient refused to keep on BP cuff, and heart monitor. States "I go to the bathroom too much to keep on".

## 2018-02-09 NOTE — ED Notes (Signed)
Pt requested an ACE bandage wrap for her midsection to help with the pain. It was applied prior to discharge with some relief.

## 2018-02-09 NOTE — ED Provider Notes (Addendum)
[facility]-EMERGENCY DEPT Provider Note   CSN: [id number] Arrival date & time: 02/09/18  1191     History   Chief Complaint No chief complaint on file.   HPI [name] is a 62 y.o. female.  The history is provided by the patient and medical records. No language interpreter was used.  Chest Pain   This is a new problem. The current episode started more than 1 week ago. The problem occurs constantly. The problem has not changed since onset.The pain is associated with breathing. The pain is present in the lateral region. The pain is at a severity of 10/10. The pain is severe. The quality of the pain is described as burning and dull. Duration of episode(s) is 6 days. Associated symptoms include abdominal pain and back pain. Pertinent negatives include no cough, no diaphoresis, no dizziness, no exertional chest pressure, no fever, no headaches, no irregular heartbeat, no leg pain, no lower extremity edema, no malaise/fatigue, no near-syncope, no numbness, no palpitations, no shortness of breath and no vomiting. She has tried nothing for the symptoms. The treatment provided no relief.    Past Medical History:  Diagnosis Date  . Allergy   . Anxiety    takes Citalopram daily  . Arthritis   . Chronic back pain    radiculopathy  . Elevated liver function tests   . Fibromyalgia   . GERD (gastroesophageal reflux disease)    takes Omeprazole daily  . History of bronchitis    last yr  . History of shingles   . Hyperlipidemia    takes Atorvastatin daily  . Hypertension    takes HCTZ daily as well as Propranolol  . Insomnia    takes trazodone nightly  . Joint pain   . Joint swelling   . Migraine    hx of  . Muscle spasm of back    takes Flexeril daily as needed  . Obesity   . Pneumonia    hx of-many,many yrs ago  . PONV (postoperative nausea and vomiting)    violent nausea/vomiting  . Rheumatoid arthritis (HCC)   . Seasonal allergies    takes Claritin  daily  . Urinary urgency     Patient Active Problem List   Diagnosis Date Noted  . Bronchitis 01/09/2018  . Chronic diarrhea 06/22/2017  . Bilateral carpal tunnel syndrome 04/07/2017  . Bilateral hand pain 04/07/2017  . Trigger middle finger of right hand 04/07/2017  . Hx of total hysterectomy 01/18/2017  . Thoracic back pain 12/28/2016  . Chronic pain syndrome 10/04/2016  . h/o Elevated fasting blood sugar 10/04/2016  . chronic Elevated liver function tests   . Family history of diabetes mellitus 07/19/2016  . Gastroesophageal reflux disease 03/14/2016  . Environmental and seasonal allergies 03/14/2016  . Iron (Fe) deficiency anemia 03/14/2016  . Obesity 03/11/2016  . Mixed hyperlipidemia 03/11/2016  . Encounter for long-term (current) use of high-risk medication 11/08/2015  . Major depressive disorder, single episode, severe without psychotic features (HCC)   . Rheumatoid arthritis (HCC) 06/22/2014  . 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 Fibromyalgia 02/19/2014  . Hypokalemia 09/18/2013  . Family history of ischemic heart disease   . Episode of dizziness after NTG while walking to BR 09/17/2013  . Arthritis of carpometacarpal (CMC) joints of both thumbs 09/12/2013  . Generalized anxiety disorder 08/23/2012  . Insomnia 08/14/2012  . Essential hypertension 06/16/2012  . Migraine, unspecified, without mention of intractable migraine without mention of status migrainosus 06/16/2012  Past Surgical History:  Procedure Laterality Date  . ABDOMINAL HYSTERECTOMY  2003  . ABDOMINOPLASTY  07/2015  . BACK SURGERY    . CARPAL TUNNEL RELEASE Bilateral   . CHOLECYSTECTOMY N/A 06/27/2012   Procedure: LAPAROSCOPIC CHOLECYSTECTOMY WITH INTRAOPERATIVE CHOLANGIOGRAM;  Surgeon: Currie Paris, MD;  Location: [facility];  Service: General;  Laterality: N/A;  . COLONOSCOPY    . LUMBAR LAMINECTOMY/DECOMPRESSION MICRODISCECTOMY Right 04/23/2014   Procedure: Right L5-S1  Laminectomy/Foraminotomy;  Surgeon: Karn Cassis, MD;  Location: [facility];  Service: Neurosurgery;  Laterality: Right;  Right L5-S1 Laminectomy/Foraminotomy  . LUMBAR WOUND DEBRIDEMENT N/A 05/01/2014   Procedure: EXPLORATION OF LUMBAR WOUND;  Surgeon: Karn Cassis, MD;  Location: [facility];  Service: Neurosurgery;  Laterality: N/A;  EXPLORATION OF LUMBAR WOUND  . LUMBAR WOUND DEBRIDEMENT N/A 05/08/2014   Procedure: LUMBAR WOUND Exploration;  Surgeon: Karn Cassis, MD;  Location: [facility];  Service: Neurosurgery;  Laterality: N/A;  . NECK SURGERY       OB History   None      Home Medications    Prior to Admission medications   Medication Sig Start Date End Date Taking? Authorizing Provider  cyclobenzaprine (FLEXERIL) 10 MG tablet Take 1 tablet (10 mg total) by mouth 3 (three) times daily as needed for muscle spasms. 08/12/17   Opalski, Gavin Pound, DO  DULoxetine (CYMBALTA) 30 MG capsule TAKE 1 CAPSULE BY MOUTH 2 TIMES DAILY FOR DEPRESSION 04/02/16   Opalski, Gavin Pound, DO  hydrochlorothiazide (HYDRODIURIL) 25 MG tablet Take 1 tablet (25 mg total) by mouth daily. for high blood pressure 02/07/18   Opalski, Deborah, DO  Ibuprofen-Famotidine (DUEXIS) 800-26.6 MG TABS Take 2 tablets by mouth daily. 08/03/17   Opalski, Gavin Pound, DO  loratadine (CLARITIN) 10 MG tablet TAKE 1 TABLET BY MOUTH EVERY DAY 11/08/17   Opalski, Gavin Pound, DO  losartan (COZAAR) 50 MG tablet Take 1 tablet (50 mg total) by mouth daily. 02/07/18   Thomasene Lot, DO  omeprazole (PRILOSEC) 20 MG capsule TAKE 1 CAPSULE BY MOUTH EVERY DAY 06/22/17   Thomasene Lot, DO  traZODone (DESYREL) 100 MG tablet 1 tab po qhs prn 02/07/18   Thomasene Lot, DO    Family History Family History  Problem Relation Age of Onset  . Leukemia Father   . Cancer Father        leukemia  . Colon polyps Father   . Diabetes Maternal Grandmother   . Rheum arthritis Maternal Grandmother   . Heart disease Paternal Grandmother   . Rheum  arthritis Mother   . Heart disease Mother        2 coronary stents  . Heart disease Maternal Uncle   . Miscarriages / Stillbirths Maternal Uncle   . Heart disease Maternal Uncle   . Colon cancer Paternal Aunt 40  . Heart disease Maternal Aunt   . Heart disease Maternal Aunt   . Esophageal cancer Neg Hx   . Rectal cancer Neg Hx   . Stomach cancer Neg Hx     Social History Social History   Tobacco Use  . Smoking status: Former Smoker    Packs/day: 0.50    Years: 10.00    Pack years: 5.00    Types: Cigarettes    Last attempt to quit: 05/03/1996    Years since quitting: 21.7  . Smokeless tobacco: Never Used  . Tobacco comment: quit smoking 90yrs ago  Substance Use Topics  . Alcohol use: No    Alcohol/week: 0.0 standard  drinks  . Drug use: No     Allergies   Hydrocodone   Review of Systems Review of Systems  Constitutional: Negative for chills, diaphoresis, fatigue, fever and malaise/fatigue.  HENT: Negative for congestion.   Eyes: Negative for visual disturbance.  Respiratory: Negative for cough, chest tightness, shortness of breath, wheezing and stridor.   Cardiovascular: Positive for chest pain. Negative for palpitations, leg swelling and near-syncope.  Gastrointestinal: Positive for abdominal pain. Negative for constipation, diarrhea and vomiting.  Genitourinary: Positive for flank pain. Negative for dysuria.  Musculoskeletal: Positive for back pain. Negative for neck pain and neck stiffness.  Neurological: Negative for dizziness, light-headedness, numbness and headaches.  All other systems reviewed and are negative.    Physical Exam Updated Vital Signs BP (!) 167/95 (BP Location: Right Arm)   Pulse 75   Temp (!) 97.5 F (36.4 C)   Resp 18   Ht 5\' 5"  (1.651 m)   Wt 90.7 kg   SpO2 96%   BMI 33.28 kg/m   Physical Exam  Constitutional: She is oriented to person, place, and time. She appears well-developed and well-nourished. No distress.  HENT:  Head:  Normocephalic.  Mouth/Throat: Oropharynx is clear and moist.  Eyes: Pupils are equal, round, and reactive to light. Conjunctivae and EOM are normal.  Neck: Normal range of motion.  Cardiovascular: Normal rate and intact distal pulses.  Murmur heard. Pulmonary/Chest: Effort normal. No stridor. No tachypnea. No respiratory distress. She has no wheezes. She has no rhonchi. She has no rales.     She exhibits tenderness.    Abdominal: Soft. Bowel sounds are normal. She exhibits no distension. There is tenderness.  Musculoskeletal: She exhibits no edema or tenderness.  Neurological: She is alert and oriented to person, place, and time. No sensory deficit. She exhibits normal muscle tone.  Skin: Capillary refill takes less than 2 seconds. No rash noted. She is not diaphoretic. No erythema.  Psychiatric: She has a normal mood and affect.  Nursing note and vitals reviewed.    ED Treatments / Results  Labs (all labs ordered are listed, but only abnormal results are displayed) Labs Reviewed  COMPREHENSIVE METABOLIC PANEL - Abnormal; Notable for the following components:      Result Value   Potassium 3.3 (*)    Glucose, Bld 116 (*)    All other components within normal limits  PROTIME-INR - Abnormal; Notable for the following components:   Prothrombin Time 11.1 (*)    All other components within normal limits  URINE CULTURE  AMMONIA  CBC WITH DIFFERENTIAL/PLATELET  LIPASE, BLOOD  URINALYSIS, ROUTINE W REFLEX MICROSCOPIC  D-DIMER, QUANTITATIVE (NOT AT [facility])  I-STAT CG4 LACTIC ACID, ED  I-STAT TROPONIN, ED  I-STAT TROPONIN, ED    EKG EKG Interpretation  Date/Time:  Thursday February 09 2018 10:07:04 EDT Ventricular Rate:  75 PR Interval:    QRS Duration: 95 QT Interval:  392 QTC Calculation: 438 R Axis:   121 Text Interpretation:  Right and left arm electrode reversal, interpretation assumes no reversal Sinus rhythm Inferior infarct, old Abnormal lateral Q waves When compared  to prior, likely limb lead reversal.  No STEMI Confirmed by Theda Belfast ([id number]) on 02/09/2018 10:24:58 AM Also confirmed by Theda Belfast ([id number]), editor Barbette Hair [phone])  on 02/09/2018 2:22:48 PM   Radiology Dg Chest 2 View  Result Date: 02/09/2018 CLINICAL DATA:  Patient with right flank pain for 6 days. EXAM: CHEST - 2 VIEW COMPARISON:  Chest radiograph 09/17/2013 FINDINGS: Normal  cardiac and mediastinal contours. Elevation right hemidiaphragm. No consolidative pulmonary opacities. No pleural effusion or pneumothorax. Cholecystectomy clips. Regional skeleton unremarkable. IMPRESSION: No acute cardiopulmonary process. Electronically Signed   By: Annia Belt M.D.   On: 02/09/2018 09:50   US Abdomen Limited Ruq  Result Date: 02/09/2018 CLINICAL DATA:  Patient with right upper quadrant abdominal pain. EXAM: ULTRASOUND ABDOMEN LIMITED RIGHT UPPER QUADRANT COMPARISON:  Ultrasound abdomen 07/01/2017 FINDINGS: Gallbladder: Surgically absent. Common bile duct: Diameter: 4 mm Liver: Increased in echogenicity. No focal lesion identified. Portal vein is patent on color Doppler imaging with normal direction of blood flow towards the liver. IMPRESSION: Prior cholecystectomy.  No biliary ductal dilatation. Increased hepatic parenchymal echogenicity compatible with steatosis. Electronically Signed   By: Annia Belt M.D.   On: 02/09/2018 10:22    Procedures Procedures (including critical care time)  Medications Ordered in ED Medications  lidocaine (LIDODERM) 5 % 1 patch (1 patch Transdermal Patch Applied 02/09/18 1351)  oxyCODONE (Oxy IR/ROXICODONE) immediate release tablet 5 mg (5 mg Oral Given 02/09/18 1503)     Initial Impression / Assessment and Plan / ED Course  I have reviewed the triage vital signs and the nursing notes.  Pertinent labs & imaging results that were available during my care of the patient were reviewed by me and considered in my medical decision making (see chart for  details).     Espyn Radwan is a 62 y.o. female with a past medical history significant for hypertension, depression, rheumatoid arthritis, GERD, history of elevated liver enzymes of unknown etiology, chronic pain managed by pain management and 5 myalgia who presents with right lower chest and right upper quadrant abdominal pain radiating around towards her back.  Patient reports that for the last week she has had new severe pains in her right side of the torso.  She says that she has been well managed by her pain control team with no font in [place] but was recently started on Flexeril last week for continued back pains.  She says that over the last 6 days she has developed severe pain in her right upper quadrant and right lower chest.  She reports it is burning and radiates around towards her back.  She says she is never had this pain before.  She reports it is extremely pleuritic and worse with deep breathing.  She denies fevers, chills, congestion, or cough.  Denies hemoptysis.  Denies leg pain or leg swelling.  Denies other abdominal pain.  Reports nausea and vomiting but she reports she is Artie had her gallbladder removed 10 years ago.  She denies any urinary symptoms, constipation, or diarrhea.  Denies trauma.  No other complaints on arrival.  On exam, patient has tenderness in her right lower chest and right upper quadrant.  Tenderness going around her back.  Skin exam showed no evidence of rash and no evidence of shingles.  Patient denies any history of shingles.  Lungs were clear and chest was otherwise nontender.  Systolic murmur present.  Right upper quadrant is tender.  Normal bowel sounds.  No significant midline back tenderness.  Clinically patient reports that her pain feels different than her chronic back pains.  I am concerned patient may have a liver etiology versus a lung etiology with the pleuritic component of her discomfort.  Patient says that in the past she has had extremely  elevated liver enzymes and had to have medications changed by her rheumatologist.  Although she denies recent medication changes, this is considered  as a hepatic cause of her pain.Marland Kitchen  She will have laboratory testing and chest x-ray to further evaluate.  Anticipate reassessment after work-up.   Patient's work-up was reassuring.  X-ray, ultrasound and labs improved from prior.  Suspect musculoskeletal symptoms.  Patient initially had a Lidoderm patch applied with minimal improvement.  Patient will be given prescription for this for 1 week.  Patient had also minimal improvement with pain medication.  Patient will be discharged home with prescription of Roxicodone.  Due to liver history, will avoid Tylenol.  Patient will follow-up with her pain team and PCP.  Patient and family understood plan of care and patient discharged in good condition.  Final Clinical Impressions(s) / ED Diagnoses   Final diagnoses:  RUQ abdominal pain  Acute right-sided thoracic back pain    ED Discharge Orders         Ordered    oxyCODONE (ROXICODONE) 5 MG immediate release tablet  Every 4 hours PRN     02/09/18 1455    lidocaine (LIDODERM) 5 %  Every 24 hours     02/09/18 1457          Clinical Impression: 1. Acute right-sided thoracic back pain   2. RUQ abdominal pain     Disposition: Discharge  Condition: Good  I have discussed the results, Dx and Tx plan with the pt(& family if present). He/she/they expressed understanding and agree(s) with the plan. Discharge instructions discussed at great length. Strict return precautions discussed and pt &/or family have verbalized understanding of the instructions. No further questions at time of discharge.    Discharge Medication List as of 02/09/2018  2:58 PM    START taking these medications   Details  lidocaine (LIDODERM) 5 % Place 1 patch onto the skin daily. Remove & Discard patch within 12 hours or as directed by MD, Starting Thu 02/09/2018, Print      oxyCODONE (ROXICODONE) 5 MG immediate release tablet Take 1 tablet (5 mg total) by mouth every 4 (four) hours as needed for severe pain., Starting Thu 02/09/2018, Print        Follow Up: Thomasene Lot, DO [address] [place] [address] [phone]     [facility] [address] [phone] mc [address] [place] [address] [phone]       Tegeler, Canary Brim, MD 02/09/18 2013    Tegeler, Canary Brim, MD 02/09/18 2014

## 2018-02-09 NOTE — Discharge Instructions (Signed)
Your work-up today was overall reassuring we did not find evidence of pneumonia, collapsed lung, refracture, blood clot, or abdominal cause of your symptoms.  We suspect it is due to the muscular skeletal system.  Please use the patch we prescribed and use the pain medication for the next few days.  Please follow-up with your pain management team and PCP.  If any symptoms change or worsen or you develop a rash, please return to the nearest emergency department.

## 2018-02-09 NOTE — ED Triage Notes (Signed)
EMS reports from home, c/o chronic back pain since Saturday, saw PCP Tuesday, given Flexeril taken without relief. Pain with with range of motion and palpation. Pt is in pain management.  BP 151/113 HR 82 RR 18 Sp02 97 RA  Pt states she took Losartan this morning

## 2018-02-10 LAB — URINE CULTURE: Culture: <10000 — AB

## 2018-02-13 ENCOUNTER — Ambulatory Visit: Payer: Self-pay | Admitting: Family Medicine

## 2018-03-09 ENCOUNTER — Telehealth: Payer: Self-pay | Admitting: Family Medicine

## 2018-03-09 NOTE — Telephone Encounter (Signed)
Patient called to cancel upcoming CPE/labs stating she was seen at [facility] and they did a full body exam and labs so she wants to hold off on having the same stuff done at our clinic. Patient wanted me to send this message just to verify if that is ok and if she needs anything additional. Please advise

## 2018-03-10 NOTE — Telephone Encounter (Signed)
Called and spoke to the patient.  She is wanting to avoid the cost of more lab work and OV at this time.  I advised the patient that I will discuss with Dr. Sharee Holster what is needed for her care at this time. And that the labs for a physical is different than what was obtained at the ER.  Will call the back back on Monday. MPulliam, CMA/RT(R)

## 2018-03-14 ENCOUNTER — Telehealth: Payer: Self-pay | Admitting: Family Medicine

## 2018-03-14 ENCOUNTER — Other Ambulatory Visit: Payer: Self-pay | Admitting: Family Medicine

## 2018-03-14 DIAGNOSIS — K219 Gastro-esophageal reflux disease without esophagitis: Secondary | ICD-10-CM

## 2018-03-14 NOTE — Telephone Encounter (Signed)
Advised the patient that CPE with labs is still recommend once a year as this is to ensure that she is getting the best possible care.  Also advised patient that the labs obtained at the hospital did not include the labs that we obtain yearly.  Lipid, thyroid, vit, and A1C was not included in recent labs. MPulliam, CMA/RT(R)

## 2018-03-14 NOTE — Telephone Encounter (Signed)
Patient called to give notice that [facility] for  Rx refill on :  omeprazole (PRILOSEC) 20 MG capsule [[id number]]   Order Details  Dose, Route, Frequency: As Directed   Dispense Quantity: 90 capsule Refills: 1 Fills remaining: --        Sig: TAKE 1 CAPSULE BY MOUTH EVERY DAY  Patient taking differently: Take 20 mg by mouth daily.      -----Per patient she thought CPE was required for Rx refill --- confused because pt had cancelled FBW & CPE for 11/18 last week w/Tyler.  ---Message to medical assistant to review with provider if OV required (since pt just here for F/U on 10/4) for Rx refill---if not pls send to :  [id number] [place] - [address]  [phone] (Phone) [phone] (Fax)  --glh

## 2018-03-14 NOTE — Telephone Encounter (Signed)
Refill has been sent in and patient notified. MPulliam, CMA/RT(R)

## 2018-03-20 ENCOUNTER — Encounter: Payer: Self-pay | Admitting: Family Medicine

## 2018-03-23 ENCOUNTER — Other Ambulatory Visit: Payer: Self-pay

## 2018-04-01 ENCOUNTER — Other Ambulatory Visit: Payer: Self-pay | Admitting: Family Medicine

## 2018-04-01 DIAGNOSIS — G4700 Insomnia, unspecified: Secondary | ICD-10-CM

## 2018-05-11 ENCOUNTER — Other Ambulatory Visit: Payer: Self-pay | Admitting: Family Medicine

## 2018-05-11 DIAGNOSIS — J302 Other seasonal allergic rhinitis: Secondary | ICD-10-CM

## 2018-05-17 ENCOUNTER — Ambulatory Visit (INDEPENDENT_AMBULATORY_CARE_PROVIDER_SITE_OTHER): Payer: BLUE CROSS/BLUE SHIELD | Admitting: Family Medicine

## 2018-05-17 ENCOUNTER — Encounter: Payer: Self-pay | Admitting: Family Medicine

## 2018-05-17 VITALS — BP 119/77 | HR 97 | Temp 99.3°F | Ht 65.0 in | Wt 208.0 lb

## 2018-05-17 DIAGNOSIS — H6122 Impacted cerumen, left ear: Secondary | ICD-10-CM

## 2018-05-17 DIAGNOSIS — H9202 Otalgia, left ear: Secondary | ICD-10-CM

## 2018-05-17 DIAGNOSIS — H9192 Unspecified hearing loss, left ear: Secondary | ICD-10-CM | POA: Diagnosis not present

## 2018-05-17 DIAGNOSIS — G894 Chronic pain syndrome: Secondary | ICD-10-CM | POA: Diagnosis not present

## 2018-05-17 DIAGNOSIS — F322 Major depressive disorder, single episode, severe without psychotic features: Secondary | ICD-10-CM

## 2018-05-17 MED ORDER — DULOXETINE HCL 30 MG PO CPEP: 30.0000 mg | ORAL_CAPSULE | Freq: Two times a day (BID) | ORAL | 0 refills | Status: AC

## 2018-05-17 MED ORDER — CYCLOBENZAPRINE HCL 10 MG PO TABS: 10.0000 mg | ORAL_TABLET | Freq: Three times a day (TID) | ORAL | 0 refills | Status: AC | PRN

## 2018-05-17 NOTE — Patient Instructions (Addendum)
As discussed I am not comfortable giving you 60 mg of Cymbalta twice daily (that your pain physician gave you)  but I can do the 30 mg twice a day.  Please get this higher dose from your pain doctor in the future.  Also please call your pain physician since your pain is so bad lately.  Please see if there is any additional medications or procedures they can do to help you.  Also has recommended, please look into 1 of the counselors below for cognitive behavioral therapy to help you deal with your chronic pain syndrome.        Behavioral Health/ Counseling Referrals    Purvis Kilts, personal counselor in [place]     Haydee Salter, specializing in marriage counseling    Dr. Marvene Staff, PHD Dr. Marvene Staff, PHD is a counselor in [place], [place].  [address] [place], [place] [address] Contact Information [phone]   Francee Nodal, [place]  31 [phone] [email or web address] ( she does [place] and faith-based coaching and counseling )    [employer]- ( faith-based counseling ) Address: [address] [place], [place] [address] [phone] [address] for appointments [phone] Fax Hours: Monday - Thursday 8:00am-6:00pm Closed for lunch 12-1Thursday only Friday: Closed all day   Danae Orleans: [phone] or Meg [phone] -counselors in [place] who are faith based   -Also Ms. Elisha Ponder - [place] behavioral [facility]. Ephriam Knuckles based counseling.    [facility] Hurley Cisco, LCSW, ACSW, M.ED.  -Hurley Cisco is a licensed clinical social worker in practice over 35 years and with Dr. Milagros Evener for the last 10 years.  -She sees adults, adolescents, children & families and couples. -Services are provided for mood and anxiety disorders, marital issues, family or parent/child problems, parenting, co-dependency, gender issues, trauma, grief, and stages of life  issues. She also provides critical incident stress debriefing.  -Britta Mccreedy accepts many employee assistance programs (EAP), [employer], Chiropractor.  PHONE  [phone]                FAX [phone]   Bethann Berkshire [email or web address] [employer]- gen counseling;  PHD   Corine Shelter, MSW [address] [place] [phone]   [facility] Caralyn Guile, PhD [address], [facility] [phone]   [facility] Psychological- children [address], [place] [phone]    [facility]- various counselors [place], [place] [address] [phone] ( one patient saw Carolynn Serve and really liked her)    Heloise Beecham Professional Counselor Counseling and [employer] [phone]   [facility]al Out[facility] abuse [facility] Taylor-Clinical Manager [address], [place] [phone]    [facility] [address] [place] [phone] Eliott Nine, PhD **   -adult, adolescent, and child Dayton Scrape, PhD Hollace Kinnier, LCSW Andrena Mews, PhD-child, adolescent and adults   [facility] [address], Ginette Otto [phone] Daun Peacock, MS-child, adolescent and adults Madelaine Etienne, PhD-adolescent and adults   [facility]-grief, terminal illness [address] [place], [place] [phone]   [facility] [address], [place] [phone] [facility] [address] [place] [phone]   [facility] [address], [place] [phone]   [facility] [address], [place] [phone]   [facility] [place] [facility] [place] [address] [place]ura Spice [phone]  [facility] [address], [place] [phone]   [facility] [address], [place] [phone]

## 2018-05-17 NOTE — Progress Notes (Signed)
Impression and Recommendations:    1. Major depressive disorder, single episode, severe without psychotic features (HCC)   2. Chronic pain syndrome- FibroMyalgia   3. Ear pain, left   4. Acute hearing loss, left   5. Hearing loss due to cerumen impaction, left     - Patient desires to have blood work drawn in March.  - Advised patient to return for a CPE in near future.   1. Indication: Cerumen impaction of the left ear,  Acute Hearing Loss due to Cerumen Cerumen Impaction  Medical necessity statement:  On physical examination, cerumen impairs clinically significant portions of the left external auditory canal, and left tympanic membrane.  Noted obstructive, copious cerumen that cannot be removed without magnification and instrumentations requiring physician skills Consent:  Discussed benefits and risks of procedure and verbal consent obtained Procedure:   Patient was prepped for the procedure.  Utilized an otoscope to assess and take note of the ear canal, the tympanic membrane, and the presence, amount, and placement of the cerumen. Gentle water irrigation was utilized by CMA to remove cerumen. Post procedure examination: shows cerumen was removed, without trauma or injury to the ear canal or TM, which remains intact.   Post-Procedural Ear Care Instructions:  Patient tolerated procedure well.  Proper ear care d/c pt.   The patient is made aware that they may experience temporary vertigo, temporary hearing loss, and temporary discomfort.  If these symptom last for more than 24 hours to call the clinic or proceed to the ED/Urgent Care.  - Advised patient to discontinue use of Q-tips.   2. Mood Management (Chronic Pain Syndrome, Fibromyalgia) - Cymbalta - Patient has been taking Cymbalta 60 BID. - Per patient, meds were adjusted by Pain Doctor. - Per patient, "I've been on it so long now, it doesn't even touch me."  - Discussed that we will adjust dose to 30 BID Cymbalta  until patient visits Pain Management again.  - Flexeril refilled today.  - Encouraged patient to walk more often.  - Reviewed the "spokes of the wheel" of mood and health management.  Stressed the importance of ongoing prudent habits, including regular exercise, appropriate sleep hygiene, healthful dietary habits, and prayer/meditation to relax.  - Patient states she doesn't want to visit more counselors.   3. Pain Management Follow-Up - Advised patient to defer to Pain Management for her desire for a renewed handicap placard.  - Advised patient to talk to Pain Management about the possibility of an intrathecal pain pump.  - Strongly advised patient to call Pain Doctor to get in to be seen sooner.  - Discussed need for patient to continue to obtain management and screenings with all established specialists (Pain, Rheumatology).  Educated patient at length about the critical importance of keeping health maintenance up to date.   Meds ordered this encounter  Medications  . DULoxetine (CYMBALTA) 30 MG capsule    Sig: Take 1 capsule (30 mg total) by mouth 2 (two) times daily. (Further refills per pain doctor)    Dispense:  180 capsule    Refill:  0  . cyclobenzaprine (FLEXERIL) 10 MG tablet    Sig: Take 1 tablet (10 mg total) by mouth 3 (three) times daily as needed for muscle spasms.    Dispense:  30 tablet    Refill:  0     Medications Discontinued During This Encounter  Medication Reason  . DULoxetine (CYMBALTA) 30 MG capsule Change in therapy  .  Ibuprofen-Famotidine (DUEXIS) 800-26.6 MG TABS Completed Course  . lidocaine (LIDODERM) 5 % Completed Course  . oxyCODONE (ROXICODONE) 5 MG immediate release tablet Completed Course  . DULoxetine (CYMBALTA) 60 MG capsule Reorder  . cyclobenzaprine (FLEXERIL) 10 MG tablet Reorder    Expresses verbal understanding and consents to current therapy and treatment regimen.  No barriers to understanding were identified.  Red flag symptoms  and signs discussed in detail.  Patient expressed understanding regarding what to do in case of emergency\urgent symptoms  Please see AVS handed out to patient at the end of our visit for further patient instructions/ counseling done pertaining to today's office visit.   Return for Medicare wellness and fasting blood work near future.     Note:  This note was prepared with assistance of Dragon voice recognition software. Occasional wrong-word or sound-a-like substitutions may have occurred due to the inherent limitations of voice recognition software.   This document serves as a record of services personally performed by Thomasene Loteborah Fleurette Woolbright, DO. It was created on her behalf by Peggye FothergillKatherine Galloway, a trained medical scribe. The creation of this record is based on the scribe's personal observations and the provider's statements to them.   I have reviewed the above medical documentation for accuracy and completeness and I concur.  Thomasene Loteborah Laikynn Pollio, DO 05/17/2018 6:24 PM    --------------------------------------------------------------------------------------------------------------------------------   Subjective:     HPI: [name] is a 63 y.o. female who [facility] for issues as discussed below.   States she can't go to her pain doctor until April.  Has issues walking and desires for handicap placard to be renewed.  [place] "I get enough exercise during the day doing all the stuff I do."  "But I'll tell ya, it's hard.  My spine hurts."  Mood (& Fibromyalgia) - Managed on Cymbalta Notes that her mood is worse when she hurts.  States that she hurts all the time; at 2-3 o'clock in the afternoon, she wants to go to bed because her back hurts, her legs hurt, her hips hurt.  When she's lying down, she doesn't have that pain.  Notes her back pain is what's changing her mood.  "I can tell when I don't take [the  Cymbalta].  I'm jittery and I'm ready to kill somebody."  Patient feels like the duexis helps [her pain] more than anything.  "I can tell I'm lasting longer during the day, but only a couple of hours."  Goes to bed at 7 o'clock because she's in so much pain.  Feels that she hasn't had a fibromyalgia flare-up in a while since changing her Cymbalta to 60 BID.  But otherwise states that she can't tell a difference in pain on the Cymbalta 60 BID vs Cymbalta 30 BID.  Ear Complaint States her ears have "started doing something funky" starting two weeks ago.  Felt like there was water in her ears, with that "water feeling" more prominent in the left ear.  Right ear will sometimes ring.  Had a bad cold in the recent past.   Wt Readings from Last 3 Encounters:  05/17/18 208 lb (94.3 kg)  02/09/18 200 lb (90.7 kg)  02/07/18 203 lb (92.1 kg)   BP Readings from Last 3 Encounters:  05/17/18 119/77  02/09/18 136/81  02/07/18 111/79   Pulse Readings from Last 3 Encounters:  05/17/18 97  02/09/18 80  02/07/18 67   BMI Readings from  Last 3 Encounters:  05/17/18 34.61 kg/m  02/09/18 33.28 kg/m  02/07/18 35.96 kg/m     Patient Care Team    Relationship Specialty Notifications Start End  Thomasene Lotpalski, Ramin Zoll, DO PCP - General Family Medicine  03/11/16   Hilda LiasBotero, Ernesto, MD Consulting Physician Neurosurgery  03/11/16   Rossie MuskratSyed, Tauseef G, MD Consulting Physician Rheumatology  03/11/16   Claria DiceWang, Hao, MD Attending Physician Physical Medicine and Rehabilitation  10/04/16    Comment: INjections in back/ neck-- part of Ortho  [facility] Consulting Physician Specialist  01/06/17    Comment: treating her for chronic neck pain- last seen this june/ july.   [facility] Consulting Physician Dermatology  01/18/17   Sherrilyn Ristanis, Henry L III, MD Consulting Physician Gastroenterology  02/07/18   Lenor Coffinauck, Richard L., MD Physician Assistant Pain Medicine  02/07/18      Patient Active Problem List    Diagnosis Date Noted  . Obesity 03/11/2016    Priority: High  . Mixed hyperlipidemia 03/11/2016    Priority: High  . Essential hypertension 06/16/2012    Priority: High  . Chronic pain syndrome 10/04/2016    Priority: Medium  . Gastroesophageal reflux disease 03/14/2016    Priority: Medium  . Iron (Fe) deficiency anemia 03/14/2016    Priority: Medium  . Major depressive disorder, single episode, severe without psychotic features (HCC)     Priority: Medium  . Fibromyalgia 02/19/2014    Priority: Medium  . Arthritis of carpometacarpal (CMC) joints of both thumbs 09/12/2013    Priority: Medium  . Migraine, unspecified, without mention of intractable migraine without mention of status migrainosus 06/16/2012    Priority: Medium  . h/o Elevated fasting blood sugar 10/04/2016    Priority: Low  . chronic Elevated liver function tests     Priority: Low  . Family history of diabetes mellitus 07/19/2016    Priority: Low  . Environmental and seasonal allergies 03/14/2016    Priority: Low  . Generalized anxiety disorder 08/23/2012    Priority: Low  . Insomnia 08/14/2012    Priority: Low  . Bronchitis 01/09/2018  . Chronic diarrhea 06/22/2017  . Bilateral carpal tunnel syndrome 04/07/2017  . Bilateral hand pain 04/07/2017  . Trigger middle finger of right hand 04/07/2017  . Hx of total hysterectomy 01/18/2017  . Thoracic back pain 12/28/2016  . Encounter for long-term (current) use of high-risk medication 11/08/2015  . Rheumatoid arthritis (HCC) 06/22/2014  . 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 Hypokalemia 09/18/2013  . Family history of ischemic heart disease   . Episode of dizziness after NTG while walking to [place] 09/17/2013    Past Medical history, Surgical history, Family history, Social history, Allergies and Medications have been entered into the medical record, reviewed and changed as needed.    Current Meds  Medication Sig  . diclofenac sodium (VOLTAREN) 1 %  GEL Apply 1 application topically 2 (two) times daily as needed.  . DULoxetine (CYMBALTA) 30 MG capsule Take 1 capsule (30 mg total) by mouth 2 (two) times daily. (Further refills per pain doctor)  . ENBREL SURECLICK 50 MG/ML injection Inject 50 mg into the muscle once a week. On Fridays  . hydrochlorothiazide (HYDRODIURIL) 25 MG tablet Take 1 tablet (25 mg total) by mouth daily. for high blood pressure  . loratadine (CLARITIN) 10 MG tablet TAKE 1 TABLET BY MOUTH EVERY DAY  . losartan (COZAAR) 50 MG tablet Take 1 tablet (50 mg total) by mouth daily.  Marland Kitchen. omeprazole (PRILOSEC) 20  MG capsule TAKE 1 CAPSULE BY MOUTH EVERY DAY  . traZODone (DESYREL) 100 MG tablet TAKE 2 TABLETS AT BEDTIME AS NEEDED FOR SLEEP  . [DISCONTINUED] DULoxetine (CYMBALTA) 60 MG capsule Take 60 mg by mouth 2 (two) times daily.   Current Facility-Administered Medications for the 05/17/18 encounter (Office Visit) with Thomasene Lot, DO  Medication  . 0.9 %  sodium chloride infusion    Allergies:  Allergies  Allergen Reactions  . Codeine Itching  . Hydrocodone Itching     Review of Systems:  A fourteen system review of systems was performed and found to be positive as per HPI.   Objective:   Blood pressure 119/77, pulse 97, temperature 99.3 F (37.4 C), height 5\' 5"  (1.651 m), weight 208 lb (94.3 kg), SpO2 99 %. Body mass index is 34.61 kg/m. General:  Well Developed, well nourished, appropriate for stated age.  Neuro:  Alert and oriented,  extra-ocular muscles intact  HEENT:  Normocephalic, atraumatic, neck supple, no carotid bruits appreciated  Ears: Cerumen impaction of the left ear.  Right ear essentially normal. Skin:  no gross rash, warm, pink. Cardiac:  RRR, S1 S2 Respiratory:  ECTA B/L and A/P, Not using accessory muscles, speaking in full sentences- unlabored. Vascular:  Ext warm, no cyanosis apprec.; cap RF less 2 sec. Psych:  No HI/SI, judgement and insight good, Euthymic mood. Full Affect.

## 2018-06-06 LAB — HEPATIC FUNCTION PANEL
ALT: 19 (ref 7–35)
AST: 39 — AB (ref 13–35)
Alkaline Phosphatase: 19 — AB (ref 25–125)
Bilirubin, Total: 0.3

## 2018-06-06 LAB — C-REACTIVE PROTEIN: CRP: 4.3

## 2018-06-06 LAB — POCT ERYTHROCYTE SEDIMENTATION RATE, NON-AUTOMATED: Sed Rate: 0

## 2018-06-06 LAB — CBC AND DIFFERENTIAL
HEMATOCRIT: 40 (ref 36–46)
HEMOGLOBIN: 13.4 (ref 12.0–16.0)
WBC: 9.1

## 2018-06-06 LAB — TSH: TSH: 1.91 (ref 0.41–5.90)

## 2018-06-06 LAB — BASIC METABOLIC PANEL
BUN: 14 (ref 4–21)
Creatinine: 0.8 (ref 0.5–1.1)
Glucose: 109
Potassium: 3.3 — AB (ref 3.4–5.3)
Sodium: 142 (ref 137–147)

## 2018-07-03 ENCOUNTER — Other Ambulatory Visit: Payer: Self-pay | Admitting: Family Medicine

## 2018-07-03 DIAGNOSIS — G4700 Insomnia, unspecified: Secondary | ICD-10-CM

## 2018-07-04 ENCOUNTER — Telehealth: Payer: Self-pay | Admitting: Family Medicine

## 2018-08-09 ENCOUNTER — Telehealth: Payer: Self-pay | Admitting: Family Medicine

## 2018-08-09 NOTE — Telephone Encounter (Signed)
Patient called during lunch and left VM stating we should be getting notes/labs from her RA provider about low potassium and wanting PCP to prescribe something for this. She can be reached at [phone] if you need further information.

## 2018-08-09 NOTE — Telephone Encounter (Signed)
Pt has not had labs with [place] including FLP, vit D, free T4, b12, A1c etc, etc in almost 2 yrs now.  She was told in early Jan to come in for FBW near future as we need updated labs.

## 2018-08-10 NOTE — Telephone Encounter (Signed)
Have note received notes/labs yet.  Noted that needs to go to Dr. Sharee Holster for review once we receive them. MPulliam, CMA/RT(R)

## 2018-08-17 ENCOUNTER — Encounter: Payer: Self-pay | Admitting: Family Medicine

## 2018-08-17 ENCOUNTER — Telehealth: Payer: Self-pay

## 2018-08-17 NOTE — Telephone Encounter (Signed)
I have requested lab results from Dr. Kathi Ludwig office to fax over the most recent labs as the only ones I have in the chart are from February. MPulliam, CMA/RT(R)

## 2018-08-17 NOTE — Telephone Encounter (Signed)
Please let patient know the last time we did a full set of yearly fasting labs was 9/18 of 2018.  We have told patient to come in yearly to get this but she obviously did not do so back in 2019.  Due to the COVID crisis, I do not recommend she come in to get wellness exams or wellness lab work since this is not critical.  There is no need to get any labs on her immediately and we should wait until this crisis is over.  Since her other physician has been managing her potassium and all, no I do not need to bring her in for that as they are monitoring her and treating her for that.  I would not have anything to contribute.  If she would like to do the Medicare wellness, we can do this in the near future via a telehealth visit, though.  Please tell patient I recommend that once all of these restrictions are lifted, that she try to get in for wellness visit and we will do fasting blood work at that time.

## 2018-08-17 NOTE — Telephone Encounter (Signed)
Patient is due for labs - we have not obtain labs 06/22/2017.  Patient also sees Dr. Kathi Ludwig and was requesting our office to review potassium level due to it being low.  Their office has started the patient on Potassium and they have her currently on prednisone.  Patient would like to come into the office for FBW, please review and advise if patient should come in at this time or if it is something that can wait due to current events.  MPulliam, CMA/RT(R)

## 2018-08-17 NOTE — Telephone Encounter (Signed)
Sent patient mychart message

## 2018-08-28 ENCOUNTER — Other Ambulatory Visit: Payer: Self-pay | Admitting: Family Medicine

## 2018-08-28 DIAGNOSIS — I10 Essential (primary) hypertension: Secondary | ICD-10-CM

## 2018-08-28 DIAGNOSIS — Z79899 Other long term (current) drug therapy: Secondary | ICD-10-CM | POA: Diagnosis not present

## 2018-08-28 DIAGNOSIS — M797 Fibromyalgia: Secondary | ICD-10-CM | POA: Diagnosis not present

## 2018-08-28 DIAGNOSIS — M0600 Rheumatoid arthritis without rheumatoid factor, unspecified site: Secondary | ICD-10-CM | POA: Diagnosis not present

## 2018-08-28 LAB — BASIC METABOLIC PANEL
BUN: 9 (ref 4–21)
Creatinine: 0.8 (ref 0.5–1.1)
Glucose: 130
Potassium: 3.2 — AB (ref 3.4–5.3)
Sodium: 140 (ref 137–147)

## 2018-08-28 LAB — HEPATIC FUNCTION PANEL
ALT: 110 — AB (ref 7–35)
AST: 54 — AB (ref 13–35)
Alkaline Phosphatase: 114 (ref 25–125)
Bilirubin, Total: 0.5

## 2018-08-28 LAB — CBC AND DIFFERENTIAL
HCT: 42 (ref 36–46)
Hemoglobin: 13.7 (ref 12.0–16.0)
Platelets: 309 (ref 150–399)
WBC: 5.7

## 2018-08-28 LAB — C-REACTIVE PROTEIN: CRP: 8

## 2018-09-09 ENCOUNTER — Other Ambulatory Visit: Payer: Self-pay | Admitting: Family Medicine

## 2018-09-09 DIAGNOSIS — K219 Gastro-esophageal reflux disease without esophagitis: Secondary | ICD-10-CM

## 2018-09-13 ENCOUNTER — Other Ambulatory Visit: Payer: Self-pay | Admitting: Family Medicine

## 2018-09-13 DIAGNOSIS — I10 Essential (primary) hypertension: Secondary | ICD-10-CM

## 2018-09-13 MED ORDER — LOSARTAN POTASSIUM 50 MG PO TABS: 50.0000 mg | ORAL_TABLET | Freq: Every day | ORAL | 1 refills | Status: AC

## 2018-09-13 NOTE — Telephone Encounter (Signed)
Patient called to say down to 1 pill & needs a refill on :   losartan (COZAAR) 50 MG tablet [[id number]]   Order Details  Dose: 50 mg Route: Oral Frequency: Daily  Dispense Quantity: 90 tablet Refills: 1 Fills remaining: --        Sig: Take 1 tablet (50 mg total) by mouth daily.     ---Forwarding request to medical assistant to send refill order to :   [id number], [place] - [address] [phone] (Phone) [phone] (Fax)   --glh

## 2018-09-28 DIAGNOSIS — R222 Localized swelling, mass and lump, trunk: Secondary | ICD-10-CM | POA: Diagnosis not present

## 2018-09-28 DIAGNOSIS — R946 Abnormal results of thyroid function studies: Secondary | ICD-10-CM | POA: Diagnosis not present

## 2018-09-28 DIAGNOSIS — Z87891 Personal history of nicotine dependence: Secondary | ICD-10-CM | POA: Diagnosis not present

## 2018-09-28 DIAGNOSIS — G4700 Insomnia, unspecified: Secondary | ICD-10-CM | POA: Diagnosis not present

## 2018-09-28 DIAGNOSIS — R0602 Shortness of breath: Secondary | ICD-10-CM | POA: Diagnosis not present

## 2018-09-28 DIAGNOSIS — Z13220 Encounter for screening for lipoid disorders: Secondary | ICD-10-CM | POA: Diagnosis not present

## 2018-09-28 DIAGNOSIS — R7309 Other abnormal glucose: Secondary | ICD-10-CM | POA: Diagnosis not present

## 2018-09-28 DIAGNOSIS — K219 Gastro-esophageal reflux disease without esophagitis: Secondary | ICD-10-CM | POA: Diagnosis not present

## 2018-09-28 DIAGNOSIS — I10 Essential (primary) hypertension: Secondary | ICD-10-CM | POA: Diagnosis not present

## 2018-09-29 ENCOUNTER — Other Ambulatory Visit: Payer: Self-pay | Admitting: Family Medicine

## 2018-09-29 DIAGNOSIS — G4700 Insomnia, unspecified: Secondary | ICD-10-CM

## 2018-09-29 NOTE — Telephone Encounter (Signed)
Please let pt know that 45 days ago, we told her to forego labs etc, but things are different now.  SHE WILL NEED full set FBW and OV BEFORE ANY RFs are given of any of her meds

## 2018-10-04 DIAGNOSIS — I10 Essential (primary) hypertension: Secondary | ICD-10-CM | POA: Diagnosis not present

## 2018-10-04 DIAGNOSIS — M1990 Unspecified osteoarthritis, unspecified site: Secondary | ICD-10-CM | POA: Diagnosis not present

## 2018-10-04 DIAGNOSIS — E669 Obesity, unspecified: Secondary | ICD-10-CM | POA: Diagnosis not present

## 2018-10-04 DIAGNOSIS — M79641 Pain in right hand: Secondary | ICD-10-CM | POA: Diagnosis not present

## 2018-10-04 DIAGNOSIS — M797 Fibromyalgia: Secondary | ICD-10-CM | POA: Diagnosis not present

## 2018-10-04 DIAGNOSIS — R5383 Other fatigue: Secondary | ICD-10-CM | POA: Diagnosis not present

## 2018-10-04 DIAGNOSIS — M5136 Other intervertebral disc degeneration, lumbar region: Secondary | ICD-10-CM | POA: Diagnosis not present

## 2018-10-04 DIAGNOSIS — M0600 Rheumatoid arthritis without rheumatoid factor, unspecified site: Secondary | ICD-10-CM | POA: Diagnosis not present

## 2018-10-04 DIAGNOSIS — Z79899 Other long term (current) drug therapy: Secondary | ICD-10-CM | POA: Diagnosis not present

## 2018-10-04 DIAGNOSIS — E876 Hypokalemia: Secondary | ICD-10-CM | POA: Diagnosis not present

## 2018-10-05 DIAGNOSIS — Z87891 Personal history of nicotine dependence: Secondary | ICD-10-CM | POA: Diagnosis not present

## 2018-10-05 DIAGNOSIS — E785 Hyperlipidemia, unspecified: Secondary | ICD-10-CM | POA: Diagnosis not present

## 2018-10-05 DIAGNOSIS — Z0000 Encounter for general adult medical examination without abnormal findings: Secondary | ICD-10-CM | POA: Diagnosis not present

## 2018-10-05 DIAGNOSIS — R1905 Periumbilic swelling, mass or lump: Secondary | ICD-10-CM | POA: Diagnosis not present

## 2018-10-05 DIAGNOSIS — R0602 Shortness of breath: Secondary | ICD-10-CM | POA: Diagnosis not present

## 2018-10-05 DIAGNOSIS — R7309 Other abnormal glucose: Secondary | ICD-10-CM | POA: Diagnosis not present

## 2018-10-05 DIAGNOSIS — R222 Localized swelling, mass and lump, trunk: Secondary | ICD-10-CM | POA: Diagnosis not present

## 2018-11-13 DIAGNOSIS — L0390 Cellulitis, unspecified: Secondary | ICD-10-CM | POA: Diagnosis not present

## 2018-11-16 DIAGNOSIS — L0390 Cellulitis, unspecified: Secondary | ICD-10-CM | POA: Diagnosis not present

## 2018-11-26 ENCOUNTER — Other Ambulatory Visit: Payer: Self-pay | Admitting: Family Medicine

## 2018-11-26 DIAGNOSIS — I10 Essential (primary) hypertension: Secondary | ICD-10-CM

## 2018-11-29 ENCOUNTER — Other Ambulatory Visit: Payer: Self-pay | Admitting: Family Medicine

## 2018-11-29 DIAGNOSIS — J302 Other seasonal allergic rhinitis: Secondary | ICD-10-CM

## 2019-01-04 DIAGNOSIS — M797 Fibromyalgia: Secondary | ICD-10-CM | POA: Diagnosis not present

## 2019-01-04 DIAGNOSIS — E669 Obesity, unspecified: Secondary | ICD-10-CM | POA: Diagnosis not present

## 2019-01-04 DIAGNOSIS — E876 Hypokalemia: Secondary | ICD-10-CM | POA: Diagnosis not present

## 2019-01-04 DIAGNOSIS — M5136 Other intervertebral disc degeneration, lumbar region: Secondary | ICD-10-CM | POA: Diagnosis not present

## 2019-01-04 DIAGNOSIS — R5383 Other fatigue: Secondary | ICD-10-CM | POA: Diagnosis not present

## 2019-01-04 DIAGNOSIS — M1990 Unspecified osteoarthritis, unspecified site: Secondary | ICD-10-CM | POA: Diagnosis not present

## 2019-01-04 DIAGNOSIS — I10 Essential (primary) hypertension: Secondary | ICD-10-CM | POA: Diagnosis not present

## 2019-01-04 DIAGNOSIS — M0600 Rheumatoid arthritis without rheumatoid factor, unspecified site: Secondary | ICD-10-CM | POA: Diagnosis not present

## 2019-01-04 DIAGNOSIS — Z79899 Other long term (current) drug therapy: Secondary | ICD-10-CM | POA: Diagnosis not present

## 2019-01-06 ENCOUNTER — Other Ambulatory Visit: Payer: Self-pay | Admitting: Family Medicine

## 2019-01-06 DIAGNOSIS — I10 Essential (primary) hypertension: Secondary | ICD-10-CM

## 2019-01-11 DIAGNOSIS — E785 Hyperlipidemia, unspecified: Secondary | ICD-10-CM | POA: Diagnosis not present

## 2019-01-11 DIAGNOSIS — R7309 Other abnormal glucose: Secondary | ICD-10-CM | POA: Diagnosis not present

## 2019-03-05 ENCOUNTER — Telehealth: Payer: Self-pay

## 2019-03-05 ENCOUNTER — Other Ambulatory Visit: Payer: Self-pay | Admitting: Family Medicine

## 2019-03-05 DIAGNOSIS — J302 Other seasonal allergic rhinitis: Secondary | ICD-10-CM

## 2019-03-05 NOTE — Telephone Encounter (Signed)
Please call pt to schedule Medicare Wellness and f/u.  No further refills until pt is seen.  Charyl Bigger, CMA

## 2019-03-23 ENCOUNTER — Other Ambulatory Visit: Payer: Self-pay | Admitting: Family Medicine

## 2019-03-23 DIAGNOSIS — J302 Other seasonal allergic rhinitis: Secondary | ICD-10-CM

## 2019-03-27 DIAGNOSIS — R69 Illness, unspecified: Secondary | ICD-10-CM | POA: Diagnosis not present

## 2019-04-05 ENCOUNTER — Telehealth: Payer: Self-pay | Admitting: Family Medicine

## 2019-04-05 NOTE — Telephone Encounter (Signed)
Patient left message to call office to set up Medicare Wellness appt.   Please call pt to schedule Medicare Wellness and f/u.  No further refills until pt is seen.  Charyl Bigger, CMA         FYI to med asst.  -glh

## 2019-04-18 DIAGNOSIS — E669 Obesity, unspecified: Secondary | ICD-10-CM | POA: Diagnosis not present

## 2019-04-18 DIAGNOSIS — M0600 Rheumatoid arthritis without rheumatoid factor, unspecified site: Secondary | ICD-10-CM | POA: Diagnosis not present

## 2019-04-18 DIAGNOSIS — M797 Fibromyalgia: Secondary | ICD-10-CM | POA: Diagnosis not present

## 2019-04-18 DIAGNOSIS — M1990 Unspecified osteoarthritis, unspecified site: Secondary | ICD-10-CM | POA: Diagnosis not present

## 2019-04-18 DIAGNOSIS — R5383 Other fatigue: Secondary | ICD-10-CM | POA: Diagnosis not present

## 2019-04-18 DIAGNOSIS — M5136 Other intervertebral disc degeneration, lumbar region: Secondary | ICD-10-CM | POA: Diagnosis not present

## 2019-06-12 DIAGNOSIS — E669 Obesity, unspecified: Secondary | ICD-10-CM | POA: Diagnosis not present

## 2019-06-12 DIAGNOSIS — M5136 Other intervertebral disc degeneration, lumbar region: Secondary | ICD-10-CM | POA: Diagnosis not present

## 2019-06-12 DIAGNOSIS — M79641 Pain in right hand: Secondary | ICD-10-CM | POA: Diagnosis not present

## 2019-06-12 DIAGNOSIS — M1990 Unspecified osteoarthritis, unspecified site: Secondary | ICD-10-CM | POA: Diagnosis not present

## 2019-06-12 DIAGNOSIS — M0600 Rheumatoid arthritis without rheumatoid factor, unspecified site: Secondary | ICD-10-CM | POA: Diagnosis not present

## 2019-06-12 DIAGNOSIS — M797 Fibromyalgia: Secondary | ICD-10-CM | POA: Diagnosis not present

## 2019-06-12 DIAGNOSIS — M79642 Pain in left hand: Secondary | ICD-10-CM | POA: Diagnosis not present

## 2019-06-12 DIAGNOSIS — R5383 Other fatigue: Secondary | ICD-10-CM | POA: Diagnosis not present

## 2019-09-20 ENCOUNTER — Other Ambulatory Visit: Payer: Self-pay | Admitting: Family Medicine

## 2019-09-20 DIAGNOSIS — J302 Other seasonal allergic rhinitis: Secondary | ICD-10-CM

## 2019-09-21 DIAGNOSIS — K611 Rectal abscess: Secondary | ICD-10-CM | POA: Diagnosis not present

## 2019-09-25 DIAGNOSIS — K610 Anal abscess: Secondary | ICD-10-CM | POA: Diagnosis not present
